# Patient Record
Sex: Female | Born: 1955 | Race: White | Hispanic: No | Smoking: Former smoker
Health system: Southern US, Community
[De-identification: ages and names within clinical notes are randomized; demographics above are authoritative.]

## PROBLEM LIST (undated history)

## (undated) DIAGNOSIS — H409 Unspecified glaucoma: Secondary | ICD-10-CM

## (undated) DIAGNOSIS — G43909 Migraine, unspecified, not intractable, without status migrainosus: Secondary | ICD-10-CM

## (undated) DIAGNOSIS — H269 Unspecified cataract: Secondary | ICD-10-CM

## (undated) DIAGNOSIS — E785 Hyperlipidemia, unspecified: Secondary | ICD-10-CM

## (undated) DIAGNOSIS — R7303 Prediabetes: Secondary | ICD-10-CM

## (undated) DIAGNOSIS — M1990 Unspecified osteoarthritis, unspecified site: Secondary | ICD-10-CM

## (undated) DIAGNOSIS — E669 Obesity, unspecified: Secondary | ICD-10-CM

## (undated) DIAGNOSIS — E039 Hypothyroidism, unspecified: Secondary | ICD-10-CM

## (undated) DIAGNOSIS — I10 Essential (primary) hypertension: Secondary | ICD-10-CM

## (undated) DIAGNOSIS — T7840XA Allergy, unspecified, initial encounter: Secondary | ICD-10-CM

## (undated) HISTORY — DX: Hypothyroidism, unspecified: E03.9

## (undated) HISTORY — PX: EYE SURGERY: SHX253

## (undated) HISTORY — DX: Hyperlipidemia, unspecified: E78.5

## (undated) HISTORY — DX: Unspecified glaucoma: H40.9

## (undated) HISTORY — DX: Unspecified cataract: H26.9

## (undated) HISTORY — DX: Migraine, unspecified, not intractable, without status migrainosus: G43.909

## (undated) HISTORY — PX: JOINT REPLACEMENT: SHX530

## (undated) HISTORY — DX: Obesity, unspecified: E66.9

## (undated) HISTORY — DX: Essential (primary) hypertension: I10

## (undated) HISTORY — DX: Unspecified osteoarthritis, unspecified site: M19.90

## (undated) HISTORY — DX: Allergy, unspecified, initial encounter: T78.40XA

---

## 1998-05-20 HISTORY — PX: OTHER SURGICAL HISTORY: SHX169

## 2001-05-20 HISTORY — PX: OTHER SURGICAL HISTORY: SHX169

## 2003-05-21 HISTORY — PX: ABDOMINAL HYSTERECTOMY: SHX81

## 2003-05-21 LAB — CONVERTED CEMR LAB

## 2005-08-18 HISTORY — PX: OTHER SURGICAL HISTORY: SHX169

## 2006-03-25 ENCOUNTER — Ambulatory Visit: Payer: Self-pay | Admitting: Family Medicine

## 2006-04-14 ENCOUNTER — Ambulatory Visit: Payer: Self-pay | Admitting: Family Medicine

## 2006-08-12 ENCOUNTER — Ambulatory Visit: Payer: Self-pay | Admitting: Family Medicine

## 2006-08-20 ENCOUNTER — Encounter: Payer: Self-pay | Admitting: Family Medicine

## 2006-08-21 ENCOUNTER — Ambulatory Visit: Payer: Self-pay | Admitting: Family Medicine

## 2006-09-22 ENCOUNTER — Encounter: Payer: Self-pay | Admitting: Family Medicine

## 2007-07-02 ENCOUNTER — Encounter: Payer: Self-pay | Admitting: Family Medicine

## 2007-07-02 DIAGNOSIS — E669 Obesity, unspecified: Secondary | ICD-10-CM | POA: Insufficient documentation

## 2007-07-02 DIAGNOSIS — Z87898 Personal history of other specified conditions: Secondary | ICD-10-CM | POA: Insufficient documentation

## 2007-07-02 DIAGNOSIS — E785 Hyperlipidemia, unspecified: Secondary | ICD-10-CM

## 2007-07-02 DIAGNOSIS — E039 Hypothyroidism, unspecified: Secondary | ICD-10-CM

## 2007-07-03 ENCOUNTER — Ambulatory Visit: Payer: Self-pay | Admitting: Family Medicine

## 2007-07-06 ENCOUNTER — Ambulatory Visit: Payer: Self-pay | Admitting: Family Medicine

## 2007-07-06 LAB — CONVERTED CEMR LAB
AST: 25 units/L (ref 0–37)
Bilirubin, Direct: 0.1 mg/dL (ref 0.0–0.3)
Chloride: 106 meq/L (ref 96–112)
Cholesterol: 190 mg/dL (ref 0–200)
Creatinine, Ser: 0.7 mg/dL (ref 0.4–1.2)
Glucose, Bld: 100 mg/dL — ABNORMAL HIGH (ref 70–99)
HDL: 41.1 mg/dL (ref >39.0)
Potassium: 4.5 meq/L (ref 3.5–5.1)
Sodium: 141 meq/L (ref 135–145)
TSH: 1.37 microintl units/mL (ref 0.35–5.50)
Total Bilirubin: 0.7 mg/dL (ref 0.3–1.2)
Total Protein: 7.2 g/dL (ref 6.0–8.3)
Triglycerides: 143 mg/dL (ref 0–149)
VLDL: 29 mg/dL (ref 0–40)

## 2007-07-09 ENCOUNTER — Telehealth: Payer: Self-pay | Admitting: Family Medicine

## 2007-07-24 ENCOUNTER — Ambulatory Visit: Payer: Self-pay | Admitting: Family Medicine

## 2007-07-27 ENCOUNTER — Encounter (INDEPENDENT_AMBULATORY_CARE_PROVIDER_SITE_OTHER): Payer: Self-pay | Admitting: *Deleted

## 2007-09-07 ENCOUNTER — Encounter: Admission: RE | Admit: 2007-09-07 | Discharge: 2007-09-07 | Payer: Self-pay | Admitting: Family Medicine

## 2008-07-11 ENCOUNTER — Ambulatory Visit: Payer: Self-pay | Admitting: Family Medicine

## 2008-07-20 LAB — CONVERTED CEMR LAB
ALT: 33 units/L (ref 0–35)
AST: 25 units/L (ref 0–37)
Alkaline Phosphatase: 69 units/L (ref 39–117)
Bilirubin, Direct: 0.1 mg/dL (ref 0.0–0.3)
CO2: 28 meq/L (ref 19–32)
Calcium: 9.1 mg/dL (ref 8.4–10.5)
Chloride: 103 meq/L (ref 96–112)
Glucose, Bld: 87 mg/dL (ref 70–99)
Potassium: 3.8 meq/L (ref 3.5–5.1)
Sodium: 139 meq/L (ref 135–145)
Total Bilirubin: 1 mg/dL (ref 0.3–1.2)
Total CHOL/HDL Ratio: 4.3

## 2008-08-05 ENCOUNTER — Ambulatory Visit: Payer: Self-pay | Admitting: Family Medicine

## 2008-08-05 ENCOUNTER — Encounter (INDEPENDENT_AMBULATORY_CARE_PROVIDER_SITE_OTHER): Payer: Self-pay | Admitting: *Deleted

## 2008-08-05 LAB — CONVERTED CEMR LAB: OCCULT 1: NEGATIVE

## 2008-09-07 ENCOUNTER — Encounter: Admission: RE | Admit: 2008-09-07 | Discharge: 2008-09-07 | Payer: Self-pay | Admitting: Family Medicine

## 2008-09-08 ENCOUNTER — Encounter (INDEPENDENT_AMBULATORY_CARE_PROVIDER_SITE_OTHER): Payer: Self-pay | Admitting: *Deleted

## 2009-06-21 ENCOUNTER — Telehealth: Payer: Self-pay | Admitting: Family Medicine

## 2009-08-10 ENCOUNTER — Ambulatory Visit: Payer: Self-pay | Admitting: Family Medicine

## 2009-08-11 LAB — CONVERTED CEMR LAB
ALT: 28 units/L (ref 0–35)
AST: 22 units/L (ref 0–37)
Albumin: 4.2 g/dL (ref 3.5–5.2)
Chloride: 104 meq/L (ref 96–112)
Eosinophils Relative: 2.2 % (ref 0.0–5.0)
GFR calc non Af Amer: 110.85 mL/min (ref >60)
Glucose, Bld: 99 mg/dL (ref 70–99)
HCT: 40.4 % (ref 36.0–46.0)
Hemoglobin: 13.3 g/dL (ref 12.0–15.0)
Lymphs Abs: 1.2 10*3/uL (ref 0.7–4.0)
MCV: 89.7 fL (ref 78.0–100.0)
Monocytes Relative: 9.6 % (ref 3.0–12.0)
Neutro Abs: 3.8 10*3/uL (ref 1.4–7.7)
Potassium: 4.2 meq/L (ref 3.5–5.1)
RDW: 13 % (ref 11.5–14.6)
Sodium: 141 meq/L (ref 135–145)
TSH: 1.51 microintl units/mL (ref 0.35–5.50)
WBC: 5.6 10*3/uL (ref 4.5–10.5)

## 2009-08-15 ENCOUNTER — Ambulatory Visit: Payer: Self-pay | Admitting: Family Medicine

## 2009-08-15 LAB — HM COLONOSCOPY

## 2009-08-28 ENCOUNTER — Ambulatory Visit: Payer: Self-pay | Admitting: Family Medicine

## 2009-08-29 LAB — CONVERTED CEMR LAB: Fecal Occult Bld: NEGATIVE

## 2009-09-18 ENCOUNTER — Encounter: Admission: RE | Admit: 2009-09-18 | Discharge: 2009-09-18 | Payer: Self-pay | Admitting: Family Medicine

## 2009-09-18 LAB — HM MAMMOGRAPHY: HM Mammogram: NEGATIVE

## 2009-10-25 ENCOUNTER — Encounter: Admission: RE | Admit: 2009-10-25 | Discharge: 2009-10-25 | Payer: Self-pay | Admitting: Sports Medicine

## 2010-04-04 ENCOUNTER — Ambulatory Visit: Payer: Self-pay | Admitting: Family Medicine

## 2010-04-04 ENCOUNTER — Encounter: Admission: RE | Admit: 2010-04-04 | Discharge: 2010-04-04 | Payer: Self-pay | Admitting: Family Medicine

## 2010-04-04 DIAGNOSIS — Z87891 Personal history of nicotine dependence: Secondary | ICD-10-CM | POA: Insufficient documentation

## 2010-04-04 DIAGNOSIS — I10 Essential (primary) hypertension: Secondary | ICD-10-CM | POA: Insufficient documentation

## 2010-04-04 DIAGNOSIS — M1610 Unilateral primary osteoarthritis, unspecified hip: Secondary | ICD-10-CM | POA: Insufficient documentation

## 2010-04-04 DIAGNOSIS — M169 Osteoarthritis of hip, unspecified: Secondary | ICD-10-CM

## 2010-04-04 HISTORY — DX: Essential (primary) hypertension: I10

## 2010-05-01 ENCOUNTER — Inpatient Hospital Stay (HOSPITAL_COMMUNITY)
Admission: RE | Admit: 2010-05-01 | Discharge: 2010-05-03 | Payer: Self-pay | Source: Home / Self Care | Attending: Orthopedic Surgery | Admitting: Orthopedic Surgery

## 2010-05-08 ENCOUNTER — Encounter: Payer: Self-pay | Admitting: Family Medicine

## 2010-05-23 ENCOUNTER — Ambulatory Visit
Admission: RE | Admit: 2010-05-23 | Discharge: 2010-05-23 | Payer: Self-pay | Source: Home / Self Care | Attending: Family Medicine | Admitting: Family Medicine

## 2010-05-23 ENCOUNTER — Encounter: Payer: Self-pay | Admitting: Family Medicine

## 2010-05-23 DIAGNOSIS — F4321 Adjustment disorder with depressed mood: Secondary | ICD-10-CM | POA: Insufficient documentation

## 2010-05-23 DIAGNOSIS — R7301 Impaired fasting glucose: Secondary | ICD-10-CM | POA: Insufficient documentation

## 2010-05-24 LAB — CONVERTED CEMR LAB: TSH: 0.877 microintl units/mL (ref 0.350–4.500)

## 2010-06-19 NOTE — Progress Notes (Signed)
Summary: LEVOTHYROXINE SODIUM 125 MCG TABS  Phone Note Call from Patient Call back at [phone]   Caller: Patient Call For: Kerby Nora MD Summary of Call: She scheduled her yearly exam for March 29th. She says that her rx LEVOTHYROXINE SODIUM 125 MCG TABS will not last until then. She wants to know if she can get a 30 day supply [facility] [place].  Initial call taken by: Melody Comas,  June 21, 2009 9:27 AM    Prescriptions: LEVOTHYROXINE SODIUM 125 MCG TABS (LEVOTHYROXINE SODIUM) Take 1 tablet by mouth once a day  #30 x 0   Entered by:   Lewanda Rife LPN   Authorized by:   Kerby Nora MD   Signed by:   Lewanda Rife LPN on 45/40/9811   Method used:   [facility]        [facility]  [place] (retail)       [address]       [place], [place]  [address]       Ph: [phone] or [phone]       Fax: [phone]   RxID:   [id number]

## 2010-06-19 NOTE — Assessment & Plan Note (Signed)
Summary: CPX  CYD   Vital Signs:  Patient profile:   55 year old female Height:      68 inches Weight:      229.2 pounds BMI:     34.98 Temp:     98.3 degrees F oral Pulse rate:   80 / minute Pulse rhythm:   regular BP sitting:   138 / 78  (left arm) Cuff size:   large  Vitals Entered By: Benny Lennert CMA Duncan Dull) (August 15, 2009 1:56 PM)  History of Present Illness: Chief complaint cpx  The patient is here for annual wellness exam and preventative care.     Doing well overall. No acute issues.  Moving to [place] with her husband. Plans to establish with Dr. Seymour Bars.  Reviewed labs in detail.   Problems Prior to Update: 1)  Other Screening Mammogram  (ICD-V76.12) 2)  Special Screening Malig Neoplasms Other Sites  (ICD-V76.49) 3)  Onychomycosis, Toenails  (ICD-110.1) 4)  Healthy Adult Female  (ICD-V70.0) 5)  Obesity  (ICD-278.00) 6)  Migraines, Hx of  (ICD-V13.8) 7)  Hypothyroidism  (ICD-244.9) 8)  Hyperlipidemia  (ICD-272.4)  Current Medications (verified): 1)  Levothyroxine Sodium 125 Mcg Tabs (Levothyroxine Sodium) .... Take 1 Tablet By Mouth Once A Day 2)  Multivitamins   Tabs (Multiple Vitamin) .... Take 1 Tablet By Mouth Once A Day 3)  Vitamin D .... Take 1 Tablet By Mouth Once A Day 4)  Caltrate 600+d 600-400 Mg-Unit  Tabs (Calcium Carbonate-Vitamin D) .... Take 1 Tablet By Mouth Once A Day 5)  Vitamin B-12 .... Take 1 Tablet By Mouth Once A Day 6)  Zinc .... Take 1 Tablet By Mouth Once A Day 7)  Celebrex 200 Mg Caps (Celecoxib) .... Take 1 Capsule By Mouth Once A Day   3 - 4 Times Per Week. 8)  Calcium 500 Mg Tabs (Calcium) .... One Tablet By Mouth Daily 9)  Fish Oil 1000 Mg Caps (Omega-3 Fatty Acids) .Marland Kitchen.. 1 Tab Two Times A Day  Allergies (verified): No Known Drug Allergies  Past History:  Past medical, surgical, family and social histories (including risk factors) reviewed, and no changes noted (except as noted below).  Past Medical  History: Reviewed history from 07/02/2007 and no changes required. OBESITY (ICD-278.00) MIGRAINES, HX OF (ICD-V13.8) HYPOTHYROIDISM (ICD-244.9) HYPERLIPIDEMIA (ICD-272.4)    Past Surgical History: Reviewed history from 07/02/2007 and no changes required. 2003    Migraines, resolved 2000    Right foot surgery, callus removed 08/2005 DEXA (+) 1.7  Family History: Reviewed history from 07/02/2007 and no changes required. Father: Alive 44, MI, stents > age 71, HTN Mother: Alive 88, MI, stents, high cholesterol Siblings: 1 brother, high cholesterol, 1 sister healthy  Social History: Reviewed history from 07/02/2007 and no changes required. Former Smoker, 15-20 PYH Alcohol use-yes, Light, 2-3 glasses of wine on weekends Drug use-no Regular exercise-no, because of right knee Marital Status: Married x 13 years, no domestic abuse Children: None Occupation: Administrator Diet :  3 meals, veggies and fruits, + H2O, [employer] biscuit one time a week  Review of Systems       no breast lesions, no vaginal discharges  General:  Denies fatigue. CV:  Denies chest pain or discomfort. Resp:  Denies shortness of breath. GI:  Denies abdominal pain. GU:  Denies dysuria. MS:  left hip pain..moderate control with voltaren. Knee pain moderate control. . Derm:  Denies rash. Psych:  Denies anxiety and depression. Endo:  Denies  cold intolerance, excessive urination, heat intolerance, and weight change.  Physical Exam  General:  obese appearing female  in NAD Ears:  External ear exam shows no significant lesions or deformities.  Otoscopic examination reveals clear canals, tympanic membranes are intact bilaterally without bulging, retraction, inflammation or discharge. Hearing is grossly normal bilaterally. Nose:  External nasal examination shows no deformity or inflammation. Nasal mucosa are pink and moist without lesions or exudates. Mouth:  Oral mucosa and oropharynx without  lesions or exudates.  Teeth in good repair. Neck:  no carotid bruit or thyromegaly no cervical or supraclavicular lymphadenopathy  Lungs:  Normal respiratory effort, chest expands symmetrically. Lungs are clear to auscultation, no crackles or wheezes. Heart:  Normal rate and regular rhythm. S1 and S2 normal without gallop, murmur, click, rub or other extra sounds. Abdomen:  Bowel sounds positive,abdomen soft and non-tender without masses, organomegaly or hernias noted. Msk:  No deformity or scoliosis noted of thoracic or lumbar spine.   Decrease ROM internal rotation left hip Pulses:  R and L posterior tibial pulses are full and equal bilaterally  Extremities:  No clubbing, cyanosis, edema, or deformity noted with normal full range of motion of all joints.   Neurologic:  No cranial nerve deficits noted. Station and gait are normal. DTRs are symmetrical throughout. Sensory, motor and coordinative functions appear intact. Skin:  Intact without suspicious lesions or rashes Psych:  Cognition and judgment appear intact. Alert and cooperative with normal attention span and concentration. No apparent delusions, illusions, hallucinations   Impression & Recommendations:  Problem # 1:  Preventive Health Care (ICD-V70.0) The patient's preventative maintenance and recommended screening tests for an annual wellness exam were reviewed in full today. Brought up to date unless services declined.  Counselled on the importance of diet, exercise, and its role in overall health and mortality. The patient's FH and SH was reviewed, including their home life, tobacco status, and drug and alcohol status.     Problem # 2:  HYPOTHYROIDISM (ICD-244.9)  Well controlled. Continue current medication.  Her updated medication list for this problem includes:    Levothyroxine Sodium 125 Mcg Tabs (Levothyroxine sodium) .Marland Kitchen... Take 1 tablet by mouth once a day  Labs Reviewed: TSH: 1.51 (08/10/2009)    Chol: 199  (08/10/2009)   HDL: 51.90 (08/10/2009)   LDL: 131 (08/10/2009)   TG: 82.0 (08/10/2009)  Problem # 3:  HYPERLIPIDEMIA (ICD-272.4) Well controlled with diet and exercise..continue working on weight loss.   Complete Medication List: 1)  Levothyroxine Sodium 125 Mcg Tabs (Levothyroxine sodium) .... Take 1 tablet by mouth once a day 2)  Multivitamins Tabs (Multiple vitamin) .... Take 1 tablet by mouth once a day 3)  Vitamin D  .... Take 1 tablet by mouth once a day 4)  Caltrate 600+d 600-400 Mg-unit Tabs (Calcium carbonate-vitamin d) .... Take 1 tablet by mouth once a day 5)  Vitamin B-12  .... Take 1 tablet by mouth once a day 6)  Zinc  .... Take 1 tablet by mouth once a day 7)  Celebrex 200 Mg Caps (Celecoxib) .... Take 1 capsule by mouth once a day   3 - 4 times per week. 8)  Calcium 500 Mg Tabs (Calcium) .... One tablet by mouth daily 9)  Fish Oil 1000 Mg Caps (Omega-3 fatty acids) .Marland Kitchen.. 1 tab two times a day  Patient Instructions: 1)  Fish oil 2000 mg divided daily: DHA and EPA. 2)  Complete stool cards.  3)  Continue healthy diet, exercise  and weight loss. 4)  Please schedule a follow-up appointment in 1 year.  Prescriptions: LEVOTHYROXINE SODIUM 125 MCG TABS (LEVOTHYROXINE SODIUM) Take 1 tablet by mouth once a day  #90 x 3   Entered and Authorized by:   Kerby Nora MD   Signed by:   Kerby Nora MD on 08/15/2009   Method used:   Electronically to        [facility] [address] (mail-order)       [facility]       [address]       [place], [place]  [address]       Ph: [phone]       Fax: [phone]   RxID:   [id number]   Current Allergies (reviewed today): No known allergies   Family History:    Reviewed history from 07/02/2007 and no changes required:       Father: Alive 43, MI, stents > age 50, HTN       Mother: Alive 9, MI, stents, high cholesterol       Siblings: 1 brother, high cholesterol, 1 sister healthy         Social History:     Reviewed history from 07/02/2007 and no changes required:       Former Smoker, 15-20 PYH       Alcohol use-yes, Light, 2-3 glasses of wine on weekends       Drug use-no       Regular exercise-no, because of right knee       Marital Status: Married x 13 years, no domestic abuse       Children: None       Occupation: Research scientist (medical) - [employer]       Diet :  3 meals, veggies and fruits, + H2O, [employer] biscuit one time a week          Last Flu Vaccine:  given (04/26/2008 11:46:56 AM) Flu Vaccine Result Date:  02/17/2009 Flu Vaccine Result:  given Flu Vaccine Next Due:  1 yr Colonoscopy Next Due:  Refused

## 2010-06-19 NOTE — Assessment & Plan Note (Signed)
Summary: pre- op clearance   Vital Signs:  Patient profile:   55 year old female Height:      68 inches Weight:      232 pounds BMI:     35.40 O2 Sat:      96 % on Room air Pulse rate:   82 / minute BP sitting:   151 / 90  (left arm) Cuff size:   large  Vitals Entered By: Payton Spark CMA (April 04, 2010 8:22 AM)  O2 Flow:  Room air CC: New to est. Surgery clearance   Primary Care Provider:  Seymour Bars DO  CC:  New to est. Surgery clearance.  History of Present Illness: 55 yo WF presents to est care.    Formerly seen by Dr Ermalene Searing at [facility].  She is a previously healthy obese nulligravid  postmenopuasal female with hx of hypothyroidism and OA.  She is set up to have a L total hip with Dr Ferd Hibbs 12-13.  She is due for an EKG today.  She had labs done at work on 8-10-201 and her A1C was 6.0= IFG range.  Her total cholesterol was 229, HDL 58, TGs 97, LDL 152.  She has never been diagnosed with HTN.  Has been exercising less with L hip pain.  No hx of OSA, asthma or heart diseases.  Quit smoking 2002.  Mammogram updated 5-201.      Current Medications (verified): 1)  Levothyroxine Sodium 125 Mcg Tabs (Levothyroxine Sodium) .... Take 1 Tablet By Mouth Once A Day 2)  Celebrex 200 Mg Caps (Celecoxib) .... Take 1 Capsule By Mouth Once A Day   3 - 4 Times Per Week. 3)  Hydrocodone-Acetaminophen 5-500 Mg Tabs (Hydrocodone-Acetaminophen) .... Take 1-2 Tabs By Mouth Daily As Needed  Allergies (verified): No Known Drug Allergies  Past History:  Past Medical History: OBESITY (ICD-278.00) MIGRAINES, HX OF (ICD-V13.8) HYPOTHYROIDISM (ICD-244.9) HYPERLIPIDEMIA (ICD-272.4) IFG     Past Surgical History: Reviewed history from 07/02/2007 and no changes required. 2003    Migraines, resolved 2000    Right foot surgery, callus removed 08/2005 DEXA (+) 1.7  Family History: Reviewed history from 07/02/2007 and no changes required. Father: Alive 55, MI, stents > age  22, HTN Mother: Alive 28, MI, stents, high cholesterol Siblings: 1 brother, high cholesterol, 1 sister healthy  Social History: Reviewed history from 07/02/2007 and no changes required. Former Smoker, 15-20 PYH Alcohol use-yes, Light, 2-3 glasses of wine on weekends Drug use-no Regular exercise-no, because of right knee Marital Status: Married x 13 years, no domestic abuse Children: None Occupation: Administrator Diet :  3 meals, veggies and fruits, + H2O, [employer] biscuit one time a week  Review of Systems       no fevers/sweats/weakness, unexplained wt loss/gain, no change in vision, no difficulty hearing, ringing in ears, no hay fever/allergies, no CP/discomfort, no palpitations, no breast lump/nipple discharge, no cough/wheeze, no blood in stool, no N/V/D, no nocturia, no leaking urine, no unusual vag bleeding, no vaginal/penile discharge, + muscle/joint pain, no rash, no new/changing mole, no HA, no memory loss, no anxiety, no sleep problem, no depression, no unexplained lumps, no easy bruising/bleeding, no concern with sexual function   Physical Exam  General:  alert, well-developed, well-nourished, well-hydrated, and overweight-appearing.   Head:  normocephalic and atraumatic.   Ears:  no external deformities.   Nose:  no nasal discharge.   Mouth:  good dentition and pharynx pink and moist.   Neck:  no masses.   Lungs:  Normal respiratory effort, chest expands symmetrically. Lungs are clear to auscultation, no crackles or wheezes. Heart:  Normal rate and regular rhythm. S1 and S2 normal without gallop, murmur, click, rub or other extra sounds. Abdomen:  Bowel sounds positive,abdomen soft and non-tender without masses, organomegaly; no AA bruits Msk:  antalgic gait with limp Pulses:  2+ radial and pedal pulses Extremities:  no UE or LE edema Neurologic:  alert & oriented X3 and cranial nerves II-XII intact.   Skin:  color normal.   Cervical Nodes:  No  lymphadenopathy noted Psych:  good eye contact, not anxious appearing, and not depressed appearing.     Impression & Recommendations:  Problem # 1:  OSTEOARTHRITIS, HIP, LEFT (ICD-715.95) Pre-op physical done  for L hip replacement to be done next month with Dr Ferd Hibbs with review of Aug 2011 labs showing hyperlipidemia and IFG.  BP high today w/o previous dx.  BMI high at 35 c/w class II obesity.  EKG done for pre- op clearance shows NSR at HR 71, QTc of 404 ms, no arryhtmia or sign of ischemia.  CXR done for hx of smoking, quit 2002.  Labs to be done pre-op by surgeon. Did not need RFs. Enouraged healthy diet and wt loss.   Her updated medication list for this problem includes:    Celebrex 200 Mg Caps (Celecoxib) .Marland Kitchen... Take 1 capsule by mouth once a day   3 - 4 times per week.    Hydrocodone-acetaminophen 5-500 Mg Tabs (Hydrocodone-acetaminophen) .Marland Kitchen... Take 1-2 tabs by mouth daily as needed  Complete Medication List: 1)  Levothyroxine Sodium 125 Mcg Tabs (Levothyroxine sodium) .... Take 1 tablet by mouth once a day 2)  Celebrex 200 Mg Caps (Celecoxib) .... Take 1 capsule by mouth once a day   3 - 4 times per week. 3)  Hydrocodone-acetaminophen 5-500 Mg Tabs (Hydrocodone-acetaminophen) .... Take 1-2 tabs by mouth daily as needed  Other Orders: EKG w/ Interpretation (93000) T-DG Chest 2 View (03474)  Patient Instructions: 1)  EKG OK 2)  CXR downstairs. 3)  Work on healthy, low carb, low sugar diet with regular exercise (as tolerated with hip DJD). 4)  REturn for follow up BP/ sugar in 6 wks.   Orders Added: 1)  Consultation Level III [99243] 2)  EKG w/ Interpretation [93000] 3)  T-DG Chest 2 View [71020]

## 2010-06-21 NOTE — Medication Information (Signed)
Summary: Med Issue Order/[facility]  Med Issue Order/[facility]   Imported By: Lanelle Bal 05/18/2010 12:27:38  _____________________________________________________________________  External Attachment:    Type:   Image     Comment:   External Document

## 2010-06-21 NOTE — Assessment & Plan Note (Signed)
Summary: f/u IFG   Vital Signs:  Patient profile:   55 year old female Height:      68 inches Weight:      221 pounds BMI:     33.72 Pulse rate:   78 / minute BP sitting:   132 / 75  (left arm) Cuff size:   large  Vitals Entered By: Kathlene November LPN (May 23, 2010 7:59 AM) CC: recheck BP   Primary Care Provider:  Seymour Bars DO  CC:  recheck BP.  History of Present Illness: 55 yo WF presents for f/u visit.  She had elevated BP at last visit and a hx of IFG.  Due to recheck a fasting sugar and her BP today.     She had a hip replacement with Dr Charlann Boxer in Nov and is healing well.  She if finishing up with PT and has been more active.  She has already lost 11 lbs and feels great.  Tapering down on pain meds.  BP has much improved.  Labs done in March.  She is tearful, telling me that her husband has filed for divorced and is moving out [place].  She has good family support and loves her job here.  She has signed up for counseling already.    Current Medications (verified): 1)  Levothyroxine Sodium 125 Mcg Tabs (Levothyroxine Sodium) .... Take 1 Tablet By Mouth Once A Day 2)  Hydrocodone-Acetaminophen 5-500 Mg Tabs (Hydrocodone-Acetaminophen) .... Take 1-2 Tabs By Mouth Daily As Needed  Allergies (verified): No Known Drug Allergies  Comments:  Nurse/Medical Assistant: The patient's medications and allergies were reviewed with the patient and were updated in the Medication and Allergy Lists. Kathlene November LPN (May 23, 2010 8:00 AM)  Past History:  Past Medical History: Reviewed history from 04/04/2010 and no changes required. OBESITY (ICD-278.00) MIGRAINES, HX OF (ICD-V13.8) HYPOTHYROIDISM (ICD-244.9) HYPERLIPIDEMIA (ICD-272.4) IFG     Past Surgical History: 2003    Migraines, resolved 2000    Right foot surgery, callus removed 08/2005 DEXA (+) 1.7 L hip replacement, Dr Charlann Boxer 03-2010  Social History: Reviewed history from 07/02/2007 and no changes  required. Former Smoker, 15-20 PYH Alcohol use-yes, Light, 2-3 glasses of wine on weekends Drug use-no Regular exercise-no, because of right knee Marital Status: Married x 13 years, no domestic abuse Children: None Occupation: Administrator Diet :  3 meals, veggies and fruits, + H2O, [employer] biscuit one time a week  Review of Systems      See HPI  Physical Exam  General:  alert, well-developed, well-nourished, and well-hydrated.  obese Head:  normocephalic and atraumatic.   Mouth:  pharynx pink and moist.   Neck:  no masses.   Lungs:  Normal respiratory effort, chest expands symmetrically. Lungs are clear to auscultation, no crackles or wheezes. Heart:  Normal rate and regular rhythm. S1 and S2 normal without gallop, murmur, click, rub or other extra sounds. Msk:  limited L hip flexion with mildly antalgic gait Extremities:  no LE edema Skin:  color normal.   Cervical Nodes:  No lymphadenopathy noted Psych:  good eye contact, not anxious appearing, and tearful.     Impression & Recommendations:  Problem # 1:  OSTEOARTHRITIS, HIP, LEFT (ICD-715.95) Much improved after ORIF with Dr Charlann Boxer in Nov.  She is tapering down off pain meds and can alternate OTC Tylenol with Aleve as needed once she goes back to work next wk. The following medications were removed from the medication list:  Celebrex 200 Mg Caps (Celecoxib) .Marland Kitchen... Take 1 capsule by mouth once a day   3 - 4 times per week. Her updated medication list for this problem includes:    Hydrocodone-acetaminophen 5-500 Mg Tabs (Hydrocodone-acetaminophen) .Marland Kitchen... Take 1-2 tabs by mouth daily as needed  Problem # 2:  IMPAIRED FASTING GLUCOSE (ICD-790.21) Fasting sugar 109 today c/w IFG.   BMI 33 c/w class I obesity. 11 lbs lost already since hip replacement due to increased activity.  We discussed a low sugar/ low carb diet with regular exercise.  Repeat in 6 mos.  Problem # 3:  ELEVATED BLOOD PRESSURE WITHOUT  DIAGNOSIS OF HYPERTENSION (ICD-796.2) Assessment: Improved BP at goal today, not on meds. Continue to work on wt loss.  Problem # 4:  ADJUSTMENT DISORDER WITH DEPRESSED MOOD (ICD-309.0) She is understandilby upset and has signed up for counseling already.  She has a good support system and is excited to return to work which she enjoys.  She declined need for meds and will let me know if needing additional help.  Complete Medication List: 1)  Levothyroxine Sodium 125 Mcg Tabs (Levothyroxine sodium) .... Take 1 tablet by mouth once a day 2)  Hydrocodone-acetaminophen 5-500 Mg Tabs (Hydrocodone-acetaminophen) .... Take 1-2 tabs by mouth daily as needed  Other Orders: T-TSH ([id number])  Patient Instructions: 1)  TSH today. 2)  Will call you w/ results tomorrow. 3)  BP looks great. 4)  Sugar 109 = pre-diabetes. 5)  Work on low sugar/ low carb diet with regular exercise and continue the good work with weight loss! 6)  Return in  4 months for a PHYSICAL with FAST[facility] LABS.   Orders Added: 1)  T-TSH [[id number]] 2)  Est. Patient Level IV [84132]

## 2010-07-30 LAB — CBC
HCT: 34.7 % — ABNORMAL LOW (ref 36.0–46.0)
Hemoglobin: 11.3 g/dL — ABNORMAL LOW (ref 12.0–15.0)
RDW: 14 % (ref 11.5–15.5)
WBC: 10.8 10*3/uL — ABNORMAL HIGH (ref 4.0–10.5)

## 2010-07-30 LAB — BASIC METABOLIC PANEL
GFR calc Af Amer: >60 mL/min (ref >60)
GFR calc non Af Amer: >60 mL/min (ref >60)
Potassium: 3.7 mEq/L (ref 3.5–5.1)
Sodium: 137 mEq/L (ref 135–145)

## 2010-07-31 ENCOUNTER — Encounter: Payer: Self-pay | Admitting: Family Medicine

## 2010-07-31 LAB — BASIC METABOLIC PANEL
BUN: 11 mg/dL (ref 6–23)
CO2: 27 mEq/L (ref 19–32)
Chloride: 105 mEq/L (ref 96–112)
Creatinine, Ser: 0.53 mg/dL (ref 0.4–1.2)
Creatinine, Ser: 0.63 mg/dL (ref 0.4–1.2)
GFR calc Af Amer: >60 mL/min (ref >60)
GFR calc non Af Amer: >60 mL/min (ref >60)
Potassium: 4 mEq/L (ref 3.5–5.1)
Potassium: 4.1 mEq/L (ref 3.5–5.1)

## 2010-07-31 LAB — CBC
HCT: 33.9 % — ABNORMAL LOW (ref 36.0–46.0)
HCT: 42.8 % (ref 36.0–46.0)
Hemoglobin: 10.9 g/dL — ABNORMAL LOW (ref 12.0–15.0)
MCH: 29.1 pg (ref 26.0–34.0)
MCV: 90.6 fL (ref 78.0–100.0)
Platelets: 289 10*3/uL (ref 150–400)
Platelets: 348 10*3/uL (ref 150–400)
RBC: 3.74 MIL/uL — ABNORMAL LOW (ref 3.87–5.11)
RDW: 13.7 % (ref 11.5–15.5)
WBC: 6.3 10*3/uL (ref 4.0–10.5)
WBC: 9.3 10*3/uL (ref 4.0–10.5)

## 2010-07-31 LAB — SURGICAL PCR SCREEN: MRSA, PCR: NEGATIVE

## 2010-07-31 LAB — DIFFERENTIAL
Basophils Absolute: 0 10*3/uL (ref 0.0–0.1)
Lymphocytes Relative: 23 % (ref 12–46)
Neutro Abs: 4.2 10*3/uL (ref 1.7–7.7)
Neutrophils Relative %: 67 % (ref 43–77)

## 2010-07-31 LAB — URINALYSIS, ROUTINE W REFLEX MICROSCOPIC
Ketones, ur: NEGATIVE mg/dL
Nitrite: NEGATIVE
Specific Gravity, Urine: 1.007 (ref 1.005–1.030)
pH: 7.5 (ref 5.0–8.0)

## 2010-07-31 LAB — TYPE AND SCREEN: ABO/RH(D): O POS

## 2010-07-31 LAB — PROTIME-INR
INR: 1.03 (ref 0.00–1.49)
Prothrombin Time: 13.7 seconds (ref 11.6–15.2)

## 2010-07-31 LAB — APTT: aPTT: 39 seconds — ABNORMAL HIGH (ref 24–37)

## 2010-08-30 ENCOUNTER — Other Ambulatory Visit: Payer: Self-pay | Admitting: Family Medicine

## 2010-08-30 DIAGNOSIS — Z1231 Encounter for screening mammogram for malignant neoplasm of breast: Secondary | ICD-10-CM

## 2010-09-07 ENCOUNTER — Encounter: Payer: Self-pay | Admitting: Family Medicine

## 2010-09-25 ENCOUNTER — Ambulatory Visit
Admission: RE | Admit: 2010-09-25 | Discharge: 2010-09-25 | Disposition: A | Discharge status: Home / Self Care | Payer: BC Managed Care – PPO | Source: Ambulatory Visit | Attending: Family Medicine | Admitting: Family Medicine

## 2010-09-25 ENCOUNTER — Other Ambulatory Visit: Payer: BC Managed Care – PPO

## 2010-09-25 DIAGNOSIS — Z13220 Encounter for screening for lipoid disorders: Secondary | ICD-10-CM

## 2010-09-25 DIAGNOSIS — E039 Hypothyroidism, unspecified: Secondary | ICD-10-CM

## 2010-09-25 DIAGNOSIS — Z1231 Encounter for screening mammogram for malignant neoplasm of breast: Secondary | ICD-10-CM

## 2010-09-25 DIAGNOSIS — Z130 Encounter for screening for diseases of the blood and blood-forming organs and certain disorders involving the immune mechanism: Secondary | ICD-10-CM

## 2010-09-25 LAB — LIPID PANEL
LDL Cholesterol: 141 mg/dL — ABNORMAL HIGH (ref 0–99)
Triglycerides: 111 mg/dL (ref <150)
VLDL: 22 mg/dL (ref 0–40)

## 2010-09-25 LAB — TSH: TSH: 0.956 u[IU]/mL (ref 0.350–4.500)

## 2010-09-26 ENCOUNTER — Telehealth: Payer: Self-pay | Admitting: Family Medicine

## 2010-09-26 LAB — COMPLETE METABOLIC PANEL WITH GFR
ALT: 18 U/L (ref 0–35)
AST: 18 U/L (ref 0–37)
Albumin: 4.6 g/dL (ref 3.5–5.2)
CO2: 24 mEq/L (ref 19–32)
Calcium: 9.7 mg/dL (ref 8.4–10.5)
Chloride: 102 mEq/L (ref 96–112)
GFR, Est African American: >60 mL/min (ref >60)
Potassium: 4.7 mEq/L (ref 3.5–5.3)
Sodium: 138 mEq/L (ref 135–145)
Total Protein: 7.1 g/dL (ref 6.0–8.3)

## 2010-09-26 NOTE — Telephone Encounter (Signed)
LMOM informing Pt of the above. Pt to CB to let me know if she will restart cholesterol med

## 2010-09-26 NOTE — Telephone Encounter (Signed)
Pls let pt know that her thyroid looks perfect on current dose of thyroid medication.  Continue and will RF if needed. Fasting sugar, liver and kidney function are normal.  Cholesterol is high at 217 and LDL bad chol is high at 141.  She is off cholesterol meds.  Is she willing to restart to get to a goal LDL of 100?

## 2010-09-28 ENCOUNTER — Encounter: Payer: Self-pay | Admitting: Family Medicine

## 2010-09-28 ENCOUNTER — Ambulatory Visit (INDEPENDENT_AMBULATORY_CARE_PROVIDER_SITE_OTHER): Payer: BC Managed Care – PPO | Admitting: Family Medicine

## 2010-09-28 VITALS — BP 146/90 | HR 86 | Ht 69.0 in | Wt 214.0 lb

## 2010-09-28 DIAGNOSIS — L989 Disorder of the skin and subcutaneous tissue, unspecified: Secondary | ICD-10-CM

## 2010-09-28 DIAGNOSIS — Z0000 Encounter for general adult medical examination without abnormal findings: Secondary | ICD-10-CM

## 2010-09-28 MED ORDER — LEVOTHYROXINE SODIUM 125 MCG PO TABS: 125.0000 ug | ORAL_TABLET | Freq: Every day | ORAL | Status: DC

## 2010-09-28 NOTE — Patient Instructions (Signed)
Keep up the good work with diet, exercise, wt loss.  Wt down from 221--> 214.  Repeat BP:  Thyroid RX sent.  Return for f/u cholesterol in 6 mos.

## 2010-09-28 NOTE — Progress Notes (Signed)
Subjective:    Patient ID: [name] [name], female    DOB: September 01, 1955, 55 y.o.   MRN: [id number]  HPI 55 yo WF presents for CPE w/o pap.  She had a hysterectomy in 05 for DUB with oophorectomy.  Due for a RF of thyroid medication. Sure he had labs done last wk and her fasting sugar had much improved.  Her LDL rose to 141 but she does not have any cardiac risk factors.  She is really working on diet, exercise and wt loss, down 7 lbs thus far.  Denies chest pain or DOE.  Denies fam hx of premature heart dz, colon cancer or breast cancer.  She just had a normal mammogram last wk.  She reports having a normal colonoscopy with Dr Juanda Chance about 2 yrs ago.  Her tdap was updated in 09.  She is happier since going thru divorce.  BP 146/90  Pulse 86  Ht 5\' 9"  (1.753 m)  Wt 214 lb (97.07 kg)  BMI 31.60 kg/m2  SpO2 98%  Past Medical History  Diagnosis Date  . Obesity   . Migraine     hx of  . Hyperlipidemia   . Hypothyroidism   . Obesity   . Migraines     Past Surgical History  Procedure Date  . Migraines, resolved 2003  . Right foot surgery 2000    callus removed  . Dexa 4/07    (+) 1.7  . Left hip replacement 11-11    Dr Charlann Boxer  . Joint replacement     L hip replacement 2011    Family History  Problem Relation Age of Onset  . Heart attack Mother   . Other Mother     stents  . Hyperlipidemia Mother   . Other Father 55    stents  . Hypertension Father   . Hyperlipidemia Brother     History   Social History  . Marital Status: Married    Spouse Name: divorced    Number of Children: N/A  . Years of Education: N/A   Occupational History  .     Social History Main Topics  . Smoking status: Former Games developer  . Smokeless tobacco: Not on file  . Alcohol Use: 1.8 oz/week    3 Glasses of wine per week     2-3 glasses of wine on weekends  . Drug Use: No  . Sexually Active: Not Currently   Other Topics Concern  . Not on file   Social History Narrative  . No narrative  on file    Not on File  Current outpatient prescriptions:HYDROcodone-acetaminophen (VICODIN) 5-500 MG per tablet, Take 1 tablet by mouth daily as needed. Take 1-2 tabs po daily prn , Disp: , Rfl: ;  levothyroxine (SYNTHROID, LEVOTHROID) 125 MCG tablet, Take 1 tablet (125 mcg total) by mouth daily., Disp: 90 tablet, Rfl: 3;  DISCONTD: levothyroxine (SYNTHROID, LEVOTHROID) 125 MCG tablet, Take 125 mcg by mouth daily.  , Disp: , Rfl:     Review of Systems Gen: no fevers, chills, hot flashes, night sweats, change in weight GI: no N/V/C/D GU: no dysuria, incontinence or sexual dysfunction CV: no chest pain, DOE, palpitations s or edema Pulm:  Denies CP, SOB or chronic cough     Objective:   Physical Exam    Gen: alert, well groomed in NAD Neck: no thyromegaly or cervical lymphadenopathy CV: RRR w/o murmur, no audible carotid bruits or abdominal aortic bruits Ext: no edema, clubbing or cyanosis Lungs: CTA bilat  w/o W/R/R; nonlabored HEENT:  [place]/AT; PERRLA; oropharynx pink and moist with good dentition Abd: soft, NT, ND, NABS, No HSM, no audible AA bruits Skin: warm and dry; no rash, pallor or jaundice, blushish colored papular lesion proximal R leg, slightly tender Psych: does not appear anxious or depressed; answers questions appropriately    Assessment & Plan:  Assesment:  1. CPE- Keeping healthy checklist for  women reviewed today.  BP high today.  BMI 31.6 c/w class I obesity range.     Labs done last wk, reviewed today with pt.  Will repeat LDL in 6 mos. Colonoscopy UTD, Dr Juanda Chance S/p TAH for noncancerous reasons, not sexually active - no need for paps. Mammogram UTd. Encouraged healthy diet, regular exercise, MVI daily. Return for next physical in 1 yr.   Derm referral made.

## 2010-10-05 NOTE — Assessment & Plan Note (Signed)
[facility]                             [facility] OFFICE NOTE   NAME:[name], [name]                        MRN:          [id number]  DATE:03/25/2006                            DOB:          February 13, 1956    CHIEF COMPLAINT:  A 55 year old white female here to establish a new doctor.   HISTORY OF PRESENT ILLNESS:  1. Right knee pain, acute.  She has been having pain in her right knee for      2 weeks.  She feels that her knee was irritated by the fact that she is      now living in a house with stairs and that she has been moving boxes in      and out and doing a lot more stair climbing than she used to.  She      denies any fall or injury.  She states that most of her tenderness      occurs with walking up stairs and walking up a grade.  Occasionally she      hears a pop or a crack when bending her knee.  The pain increases with      bending her knee.  She did notice some swelling, but no redness.  She      occasionally takes ibuprofen p.r.n. pain.  2. Rash, acute.  Ms. [name] [name] reports patches of blisters on an erythematous      background across her right chest for about 1 week.  She states the      areas are very itchy but not very tender.  The blisters are now gone      and the areas are just red and are starting to scab over.   REVIEW OF SYSTEMS:  No headache, dizziness, syncope, chest pain,  palpitations, shortness of breath, nausea, vomiting, diarrhea, constipation.  Occasional left hip pain with pop and tenderness, but then goes away and  resolves on its own.   PAST MEDICAL HISTORY:  1. Migraines, resolved.  2. Hypothyroidism.  3. Hypercholesterolemia.   HOSPITALIZATION SURGERIES PROCEDURES:  1. 2003 full hysterectomy for menorrhagia.  2. 2000 right foot surgery callus removed.  3. April of 2007 DEXA positive 1.7.  4. Mammogram in April of 2007 negative.  5. Hemoccult cards done in 1999, negative.   ALLERGIES:  No known drug  allergies.   MEDICATIONS:  1. Calcium 600 mg daily with vitamin D.  2. Vitamin E 400 International Units daily.  3. Folic acid 400 mcg daily.  4. Vitamin B12 1000 mcg daily.  5. Aspirin 325 mg daily.  6. Multivitamin daily.  7. Levothyroxine 125 mcg daily.   FAMILY HISTORY:  Father alive at age 15 with MI after age 52.  He does have  several stents and deals with hypertension.  Her mother is alive at age 60  with an MI also after age 66.  She has several stents as well and elevated  cholesterol.  She has one brother and one sister.  The sister is healthy,  but the brother has high cholesterol.  There is no  family history of any  type of cancer.   SOCIAL HISTORY:  She is an Research scientist (medical) at [employer].  She has been  married for 13 years and denies domestic abuse.  She has no children.  She  does not get regular exercise, but used to before she injured her knee.  She  would like to walk more frequently than she does right now.   DIET:  She eats three meals per day including fruits, vegetables, and water.  She does have a McDonald's biscuit about one time per week.  She has a 15-20  pack-year history of smoking, but quit quite some time ago.  She drinks  alcohol in the form of 2-3 glasses of wine on a weekend day.  She denies  drug abuse.   PHYSICAL EXAMINATION:  VITAL SIGNS:  Height 68 inches, weight 222, making  BMI 34, blood pressure 132/80, pulse 72, temperature 98.4.  GENERAL:  Obese appearing female in no acute distress.  HEENT:  PERRLA, extraocular muscles intact, oropharynx clear.  Tympanic  membranes clear.  Nares clear.  No thyromegaly, no lymphadenopathy  supraclavicular or cervical.  HEART:  Regular rate and rhythm, no murmurs, rubs, or gallops.  LUNGS:  Clear to auscultation bilaterally.  No wheezes, rales, or rhonchi.  ABDOMEN:  Soft and nontender.  Normal active bowel sounds.  No  hepatosplenomegaly.  MUSCULOSKELETAL:  Tenderness to palpation over posterior joint  line in right  knee.  Negative McMurray's.  Negative anterior and posterior drawer.  Good  stability of collateral ligaments, antalgic gait.  Mild swelling, but no  erythema of right knee.  NEUROLOGY:  Alert and oriented x3.  Cranial nerves II-XII grossly intact.  Reflexes 2+.  SKIN:  Erythematous healing scabs on right anterior chest at approximately  T3 dermatome.  No vesicals.   X-RAYS:  Bilateral knee comparison shows degenerative wear and tear on both  knees with a large bone spur in the right knee.   ASSESSMENT:  1. Right knee pain.  This is most likely secondary to osteoarthritis flare      worsened by a bone spur in her right knee.  We will begin with      conservative treatment with ice, anti-inflammatories, and exercise.      She was given information about osteoarthritis and how to strengthen      her right knee.  She was given a prescription for Diclofenac 75 mg p.o.      b.i.d. p.r.n. pain.  If her pain does not improve over the next few      weeks, she will return and we can consider steroid joint injection and      physical therapy.  2. Rash.  The rash appears to be possible shingles as it is in a      dermatomal pattern on the right upper chest.  It does seem to be      resolving and she does not have significant pain with it.  She is out      of window for treatment with acyclovir.  She will let me know if pain      is not well controlled.  3. Prevention.  She is up-to-date with her mammogram.  She does not need a      Pap smear.      We did discuss colonoscopy which she will do, but she would like to      hold off on scheduling at this point.  I will obtain records  from a      previous doctor to determine when Tetanus was      last given.  She has had a DEXA scan as well as a cholesterol panel      showing normal bone density and cholesterol respectively.  She was      given a flu vaccine today.     Kerby Nora, MD    AB/MedQ  DD: 03/26/2006  DT: 03/26/2006   Job #: [id number]

## 2011-01-16 ENCOUNTER — Other Ambulatory Visit: Payer: Self-pay | Admitting: Sports Medicine

## 2011-01-16 ENCOUNTER — Ambulatory Visit
Admission: RE | Admit: 2011-01-16 | Discharge: 2011-01-16 | Disposition: A | Discharge status: Home / Self Care | Payer: BC Managed Care – PPO | Source: Ambulatory Visit | Attending: Sports Medicine | Admitting: Sports Medicine

## 2011-01-16 DIAGNOSIS — M25561 Pain in right knee: Secondary | ICD-10-CM

## 2011-04-02 ENCOUNTER — Telehealth: Payer: Self-pay | Admitting: *Deleted

## 2011-04-02 ENCOUNTER — Other Ambulatory Visit: Payer: BC Managed Care – PPO

## 2011-04-02 DIAGNOSIS — E785 Hyperlipidemia, unspecified: Secondary | ICD-10-CM

## 2011-04-02 LAB — LIPID PANEL
Cholesterol: 228 mg/dL — ABNORMAL HIGH (ref 0–200)
HDL: 71 mg/dL (ref >39)
Total CHOL/HDL Ratio: 3.2 Ratio

## 2011-04-05 ENCOUNTER — Ambulatory Visit: Payer: BC Managed Care – PPO | Admitting: Family Medicine

## 2011-08-22 ENCOUNTER — Other Ambulatory Visit: Payer: Self-pay | Admitting: Family Medicine

## 2011-08-22 DIAGNOSIS — Z1231 Encounter for screening mammogram for malignant neoplasm of breast: Secondary | ICD-10-CM

## 2011-08-29 ENCOUNTER — Encounter: Payer: Self-pay | Admitting: Family Medicine

## 2011-08-29 ENCOUNTER — Ambulatory Visit (INDEPENDENT_AMBULATORY_CARE_PROVIDER_SITE_OTHER): Payer: Managed Care, Other (non HMO) | Admitting: Family Medicine

## 2011-08-29 VITALS — BP 137/86 | HR 68 | Ht 68.0 in | Wt 223.0 lb

## 2011-08-29 DIAGNOSIS — E039 Hypothyroidism, unspecified: Secondary | ICD-10-CM

## 2011-08-29 DIAGNOSIS — E785 Hyperlipidemia, unspecified: Secondary | ICD-10-CM

## 2011-08-29 LAB — COMPLETE METABOLIC PANEL WITH GFR
ALT: 24 U/L (ref 0–35)
AST: 17 U/L (ref 0–37)
Albumin: 4.2 g/dL (ref 3.5–5.2)
BUN: 19 mg/dL (ref 6–23)
CO2: 24 mEq/L (ref 19–32)
Calcium: 9.5 mg/dL (ref 8.4–10.5)
Chloride: 104 mEq/L (ref 96–112)
Creat: 0.7 mg/dL (ref 0.50–1.10)
GFR, Est African American: >89 mL/min
Potassium: 4.1 mEq/L (ref 3.5–5.3)

## 2011-08-29 LAB — LDL CHOLESTEROL, DIRECT: Direct LDL: 139 mg/dL — ABNORMAL HIGH

## 2011-08-29 NOTE — Progress Notes (Signed)
  Subjective:    Patient ID: [name] [name], female    DOB: [birth year], 56 y.o.   MRN: [id number]  HPI Hypothyroid - No concerns. No skin or hiar changes.  SLeeping well. No major fatitgue.  Good mood.  Needs to follow up on labwork as well.    Review of Systems     Objective:   Physical Exam  Constitutional: She is oriented to person, place, and time. She appears well-developed and well-nourished.  HENT:  Head: Normocephalic and atraumatic.  Neck: Neck supple. No thyromegaly present.  Cardiovascular: Normal rate, regular rhythm and normal heart sounds.   Pulmonary/Chest: Effort normal and breath sounds normal.  Lymphadenopathy:    She has no cervical adenopathy.  Neurological: She is alert and oriented to person, place, and time.  Skin: Skin is warm and dry.  Psychiatric: She has a normal mood and affect. Her behavior is normal.          Assessment & Plan:  Hypothyroid - Recheck thyroid today. Will call with results. Will ohold off on refills until get new level.   Hyperlipidemai- Recheck lipid levels today.  Will call with results.

## 2011-09-03 ENCOUNTER — Other Ambulatory Visit: Payer: Self-pay | Admitting: *Deleted

## 2011-09-03 MED ORDER — LEVOTHYROXINE SODIUM 125 MCG PO TABS: 125.0000 ug | ORAL_TABLET | Freq: Every day | ORAL | Status: DC

## 2011-09-04 ENCOUNTER — Telehealth: Payer: Self-pay | Admitting: *Deleted

## 2011-09-04 MED ORDER — SIMVASTATIN 40 MG PO TABS: 40.0000 mg | ORAL_TABLET | Freq: Every evening | ORAL | Status: DC

## 2011-09-04 NOTE — Telephone Encounter (Signed)
Pt had labs and you wanted her to start a cholesterol med. She is ok with this and needs med sent to [facility] pharmacy.

## 2011-09-04 NOTE — Telephone Encounter (Signed)
rx sent

## 2011-10-01 ENCOUNTER — Ambulatory Visit
Admission: RE | Admit: 2011-10-01 | Discharge: 2011-10-01 | Disposition: A | Discharge status: Home / Self Care | Payer: Managed Care, Other (non HMO) | Source: Ambulatory Visit | Attending: Family Medicine | Admitting: Family Medicine

## 2011-10-01 DIAGNOSIS — Z1231 Encounter for screening mammogram for malignant neoplasm of breast: Secondary | ICD-10-CM

## 2011-10-02 ENCOUNTER — Other Ambulatory Visit: Payer: Self-pay | Admitting: *Deleted

## 2011-10-02 MED ORDER — SIMVASTATIN 40 MG PO TABS: 40.0000 mg | ORAL_TABLET | Freq: Every evening | ORAL | Status: DC

## 2011-10-21 ENCOUNTER — Telehealth: Payer: Self-pay | Admitting: *Deleted

## 2011-10-21 NOTE — Telephone Encounter (Signed)
fthat sounds fair. Stop med and see if feels better. We can always try a different statin. Most of the time we can find one that doesn't cause any SE but may take time to try a couple.

## 2011-10-21 NOTE — Telephone Encounter (Signed)
LMOM

## 2011-10-21 NOTE — Telephone Encounter (Signed)
Pt states that ever since she started taking Zocor that her legs have felt like lead and her feet hurt. She also states that she has had symptoms of the shingles but without the blisters. States she will be stopping the med to see if this clears up. States she just began a new job and when she gets insurance she will come in to discuss other options.

## 2012-01-24 ENCOUNTER — Telehealth: Payer: Self-pay | Admitting: *Deleted

## 2012-01-24 NOTE — Telephone Encounter (Signed)
Can use lamisil on cracks of mouth, just don't get into mouth. If not improvin make appt.

## 2012-01-24 NOTE — Telephone Encounter (Signed)
Pt states she had gotten what looked to be a ringworm on her leg and some vaginal itching. Then her lips got chapped and they also cracked at the side of her mouth. She states that the ring worm is almost gone and the itching is gone. States she got on web md and starting using cortizone on the cracks. States it started healing but know it is getting worse again. Please advise on what pt should do.

## 2012-01-24 NOTE — Telephone Encounter (Signed)
Pt informed

## 2012-02-24 ENCOUNTER — Telehealth: Payer: Self-pay | Admitting: *Deleted

## 2012-02-24 DIAGNOSIS — E785 Hyperlipidemia, unspecified: Secondary | ICD-10-CM

## 2012-02-24 DIAGNOSIS — E039 Hypothyroidism, unspecified: Secondary | ICD-10-CM

## 2012-02-24 DIAGNOSIS — R030 Elevated blood-pressure reading, without diagnosis of hypertension: Secondary | ICD-10-CM

## 2012-02-24 NOTE — Telephone Encounter (Signed)
Let get lipoids, cmp, tsh. Thank you.

## 2012-02-24 NOTE — Telephone Encounter (Signed)
Pt has appt next week and wants to get labs done so you will have available to discuss. What labs do you want and I will put them in

## 2012-02-26 ENCOUNTER — Ambulatory Visit (INDEPENDENT_AMBULATORY_CARE_PROVIDER_SITE_OTHER): Payer: BC Managed Care – PPO | Admitting: Family Medicine

## 2012-02-26 ENCOUNTER — Other Ambulatory Visit: Payer: Self-pay | Admitting: Family Medicine

## 2012-02-26 ENCOUNTER — Encounter: Payer: Self-pay | Admitting: Family Medicine

## 2012-02-26 VITALS — BP 156/90 | HR 77 | Ht 69.0 in | Wt 225.0 lb

## 2012-02-26 DIAGNOSIS — I10 Essential (primary) hypertension: Secondary | ICD-10-CM

## 2012-02-26 DIAGNOSIS — E039 Hypothyroidism, unspecified: Secondary | ICD-10-CM

## 2012-02-26 DIAGNOSIS — N760 Acute vaginitis: Secondary | ICD-10-CM

## 2012-02-26 LAB — COMPLETE METABOLIC PANEL WITH GFR
AST: 23 U/L (ref 0–37)
Albumin: 4.6 g/dL (ref 3.5–5.2)
Alkaline Phosphatase: 80 U/L (ref 39–117)
Calcium: 9.9 mg/dL (ref 8.4–10.5)
Chloride: 104 mEq/L (ref 96–112)
Potassium: 4.9 mEq/L (ref 3.5–5.3)
Sodium: 139 mEq/L (ref 135–145)
Total Protein: 6.9 g/dL (ref 6.0–8.3)

## 2012-02-26 LAB — LIPID PANEL
Total CHOL/HDL Ratio: 4.2 Ratio
VLDL: 25 mg/dL (ref 0–40)

## 2012-02-26 LAB — TSH: TSH: 1.565 u[IU]/mL (ref 0.350–4.500)

## 2012-02-26 MED ORDER — LEVOTHYROXINE SODIUM 125 MCG PO TABS: 125.0000 ug | ORAL_TABLET | Freq: Every day | ORAL | Status: DC

## 2012-02-26 MED ORDER — FLUCONAZOLE 150 MG PO TABS: 150.0000 mg | ORAL_TABLET | Freq: Once | ORAL | Status: DC

## 2012-02-26 MED ORDER — LISINOPRIL 20 MG PO TABS: 20.0000 mg | ORAL_TABLET | Freq: Every day | ORAL | Status: DC

## 2012-02-26 NOTE — Progress Notes (Signed)
  Subjective:    Patient ID: [name] [name], female    DOB: 01-05-1956, 56 y.o.   MRN: [id number]  HPI 6 weeks ago had ringworm on her left inner leg and then had cracked itchy rah on the side of her mouth.  Says started with vaginal itch as well.  Went and got cortisone and then got an antifungal - no relief. Then the rash on her leg and mouth cleared up. Still had vag itching. Them tried clotrimazole and started that 3 days ago and has used it today.  She denies any recent antibiotic use.  Elevated BP - BP has been high last several timse she hsa been here. No CP or SOB  Hypothyroidism-she says she's doing well on her current dose. No weight changes. No skin or hair changes. Review of Systems     Objective:   Physical Exam  Constitutional: She is oriented to person, place, and time. She appears well-developed and well-nourished.  HENT:  Head: Normocephalic and atraumatic.  Cardiovascular: Normal rate, regular rhythm and normal heart sounds.   Pulmonary/Chest: Effort normal and breath sounds normal.  Genitourinary:       No vaginal irritation or lesion. Wet prep collected.   Neurological: She is alert and oriented to person, place, and time.  Skin: Skin is warm and dry.  Psychiatric: She has a normal mood and affect. Her behavior is normal.          Assessment & Plan:  HTN- New Dx. Discussed DASH diet and low salt diet. H.O given. Will start lisinopril 20mg  afn f/uin 6 weeks for BP check.  aslo encouraged diet and exercise and weight loss.   Hypothyroid - went for labs this AM.  Due for refills.  Sent ot mail order.   Vaginitis - Will send wet prep.  BC she has been on clotrimazole will go ahead and send over diflucant 150mg  x 1.

## 2012-02-26 NOTE — Patient Instructions (Addendum)

## 2012-02-27 MED ORDER — ATORVASTATIN CALCIUM 40 MG PO TABS: 40.0000 mg | ORAL_TABLET | Freq: Every day | ORAL | Status: DC

## 2012-02-27 NOTE — Telephone Encounter (Signed)
Pt notified of results. Pt stopped the simvastatin a long time ago due to muscle and joint aches. Says her parents is on generic Lipitor and it works good for them. Request to have something on the 10.00 list at [facility] in [place]

## 2012-02-27 NOTE — Telephone Encounter (Signed)
Ok will send over generic lipitor. Don't know if on the $10 list or not.

## 2012-02-27 NOTE — Addendum Note (Signed)
Addended by: Nani Gasser D on: 02/27/2012 02:48 PM   Modules accepted: Orders

## 2012-02-28 ENCOUNTER — Other Ambulatory Visit: Payer: Self-pay | Admitting: *Deleted

## 2012-02-28 LAB — WET PREP, GENITAL

## 2012-02-28 MED ORDER — ATORVASTATIN CALCIUM 40 MG PO TABS: 40.0000 mg | ORAL_TABLET | Freq: Every day | ORAL | Status: DC

## 2012-03-02 ENCOUNTER — Ambulatory Visit: Payer: Managed Care, Other (non HMO) | Admitting: Family Medicine

## 2012-04-08 ENCOUNTER — Ambulatory Visit (INDEPENDENT_AMBULATORY_CARE_PROVIDER_SITE_OTHER): Payer: BC Managed Care – PPO | Admitting: Family Medicine

## 2012-04-08 VITALS — BP 123/71 | HR 90 | Wt 229.0 lb

## 2012-04-08 DIAGNOSIS — I10 Essential (primary) hypertension: Secondary | ICD-10-CM

## 2012-04-08 MED ORDER — LISINOPRIL 20 MG PO TABS: 20.0000 mg | ORAL_TABLET | Freq: Every day | ORAL | Status: DC

## 2012-04-08 MED ORDER — ATORVASTATIN CALCIUM 40 MG PO TABS: 40.0000 mg | ORAL_TABLET | Freq: Every day | ORAL | Status: DC

## 2012-04-08 NOTE — Progress Notes (Signed)
  Subjective:    Patient ID: [name] [name], female    DOB: 1955/12/18, 56 y.o.   MRN: [id number]   Adiba is here today for follow up on blood pressure. She denies chest pain, shortness of breath, headaches or vision changes. She would like her refills sent to her mail order. She will need a refill on Lisinopril and Lipitor. 5 minutes spent with patient.   HPI    Review of Systems     Objective:   Physical Exam        Assessment & Plan:  HTN - Well controlled OK for refills.  Nani Gasser, MD

## 2012-05-29 ENCOUNTER — Other Ambulatory Visit: Payer: Self-pay | Admitting: *Deleted

## 2012-05-29 MED ORDER — LEVOTHYROXINE SODIUM 125 MCG PO TABS: 125.0000 ug | ORAL_TABLET | Freq: Every day | ORAL | Status: DC

## 2012-05-29 MED ORDER — ATORVASTATIN CALCIUM 40 MG PO TABS: 40.0000 mg | ORAL_TABLET | Freq: Every day | ORAL | Status: DC

## 2012-05-29 MED ORDER — LISINOPRIL 20 MG PO TABS: 20.0000 mg | ORAL_TABLET | Freq: Every day | ORAL | Status: DC

## 2012-08-25 ENCOUNTER — Telehealth: Payer: Self-pay | Admitting: *Deleted

## 2012-08-25 NOTE — Telephone Encounter (Signed)
Pt calls & states that she is taking lisinopril & that she is having extreme difficulty swallowing.  Pt wants to know if there is another bp med that she can take instead. Please advise.

## 2012-08-25 NOTE — Telephone Encounter (Signed)
Does she feel like her throat is swollen of is the pill to larger?

## 2012-08-25 NOTE — Telephone Encounter (Signed)
Pt states that she "feels like there is a giant cotton ball" in the middle of her throat.  She states this didn't start until she started the statin & bp med. She states that she wakes up in the middle of the night because she is unable to swallow.

## 2012-08-25 NOTE — Telephone Encounter (Signed)
Patient notified by myself after hours.  This swollen throat has been going on for months since starting lisinopril, worsening on a weekly basis.  She will stop taking lisinopril immediately.  She does not want a replacement yet, wants to see how her BP does over the next seven days.  Agrees to come see me or a partner for readings over 140/90 do discuss alternative such as HCTZ.

## 2012-09-17 ENCOUNTER — Telehealth: Payer: Self-pay | Admitting: Family Medicine

## 2012-09-17 MED ORDER — HYDROCHLOROTHIAZIDE 25 MG PO TABS: 25.0000 mg | ORAL_TABLET | Freq: Every day | ORAL | Status: DC

## 2012-09-17 NOTE — Telephone Encounter (Signed)
Please call patient. I did receive her that about her recent blood pressures. They're definitely above goal. I will send her for new prescription to her pharmacy for blood pressure medication. Continue to track blood pressures and if she can fax me those numbers again in about 2 weeks on the medication that would be great. Prescription sent to [facility] pharmacy. Needs to be seen in about a month on the new medication. I know she plans on making an appointment sometime soon per her fax.

## 2012-09-17 NOTE — Telephone Encounter (Signed)
Pt notified of results and instructions. Kimberly Gordon, LPN  

## 2012-09-18 ENCOUNTER — Other Ambulatory Visit: Payer: Self-pay | Admitting: Family Medicine

## 2012-09-18 DIAGNOSIS — Z1231 Encounter for screening mammogram for malignant neoplasm of breast: Secondary | ICD-10-CM

## 2012-10-07 ENCOUNTER — Telehealth: Payer: Self-pay | Admitting: *Deleted

## 2012-10-07 DIAGNOSIS — Z0000 Encounter for general adult medical examination without abnormal findings: Secondary | ICD-10-CM

## 2012-10-07 DIAGNOSIS — E039 Hypothyroidism, unspecified: Secondary | ICD-10-CM

## 2012-10-07 DIAGNOSIS — E785 Hyperlipidemia, unspecified: Secondary | ICD-10-CM

## 2012-10-07 NOTE — Telephone Encounter (Signed)
Patient has CPE scheduled with you on 31st and will go to get labs done on Tueasday 27th. What labs would you like and diagnosis and I will enter and fax to lab. Thanks. Barry Dienes, LPN

## 2012-10-07 NOTE — Telephone Encounter (Signed)
CMP and lipids and TSH. Use V70.0 and 272.4 and hypothyroid

## 2012-10-07 NOTE — Telephone Encounter (Signed)
Pt notified and labs entered. Barry Dienes, LPN

## 2012-10-13 ENCOUNTER — Other Ambulatory Visit: Payer: Self-pay | Admitting: Family Medicine

## 2012-10-13 ENCOUNTER — Ambulatory Visit: Payer: BC Managed Care – PPO

## 2012-10-13 LAB — COMPLETE METABOLIC PANEL WITH GFR
ALT: 26 U/L (ref 0–35)
Albumin: 4.2 g/dL (ref 3.5–5.2)
CO2: 26 mEq/L (ref 19–32)
Calcium: 9.7 mg/dL (ref 8.4–10.5)
Chloride: 104 mEq/L (ref 96–112)
GFR, Est African American: >89 mL/min
Potassium: 4.3 mEq/L (ref 3.5–5.3)
Sodium: 138 mEq/L (ref 135–145)
Total Bilirubin: 0.6 mg/dL (ref 0.3–1.2)
Total Protein: 6.7 g/dL (ref 6.0–8.3)

## 2012-10-14 LAB — LIPID PANEL
HDL: 59 mg/dL (ref >39)
Total CHOL/HDL Ratio: 2.4 Ratio
Triglycerides: 107 mg/dL (ref <150)

## 2012-10-14 NOTE — Progress Notes (Signed)
Quick Note:  All labs are normal. ______ 

## 2012-10-16 ENCOUNTER — Ambulatory Visit (INDEPENDENT_AMBULATORY_CARE_PROVIDER_SITE_OTHER): Payer: Managed Care, Other (non HMO) | Admitting: Family Medicine

## 2012-10-16 ENCOUNTER — Encounter: Payer: Self-pay | Admitting: Family Medicine

## 2012-10-16 VITALS — BP 125/62 | HR 81 | Ht 69.0 in | Wt 237.0 lb

## 2012-10-16 DIAGNOSIS — Z0000 Encounter for general adult medical examination without abnormal findings: Secondary | ICD-10-CM

## 2012-10-16 DIAGNOSIS — R635 Abnormal weight gain: Secondary | ICD-10-CM

## 2012-10-16 DIAGNOSIS — E118 Type 2 diabetes mellitus with unspecified complications: Secondary | ICD-10-CM | POA: Insufficient documentation

## 2012-10-16 DIAGNOSIS — H409 Unspecified glaucoma: Secondary | ICD-10-CM

## 2012-10-16 DIAGNOSIS — R7301 Impaired fasting glucose: Secondary | ICD-10-CM

## 2012-10-16 LAB — POCT GLYCOSYLATED HEMOGLOBIN (HGB A1C): Hemoglobin A1C: 6.1

## 2012-10-16 MED ORDER — LEVOTHYROXINE SODIUM 125 MCG PO TABS: 125.0000 ug | ORAL_TABLET | Freq: Every day | ORAL | Status: DC

## 2012-10-16 MED ORDER — PHENTERMINE HCL 37.5 MG PO CAPS: 37.5000 mg | ORAL_CAPSULE | ORAL | Status: DC

## 2012-10-16 MED ORDER — HYDROCHLOROTHIAZIDE 25 MG PO TABS: 25.0000 mg | ORAL_TABLET | Freq: Every day | ORAL | Status: DC

## 2012-10-16 MED ORDER — ATORVASTATIN CALCIUM 40 MG PO TABS: 40.0000 mg | ORAL_TABLET | Freq: Every day | ORAL | Status: DC

## 2012-10-16 NOTE — Addendum Note (Signed)
Addended by: Deno Etienne on: 10/16/2012 09:28 AM   Modules accepted: Orders

## 2012-10-16 NOTE — Addendum Note (Signed)
Addended by: Nani Gasser D on: 10/16/2012 09:44 AM   Modules accepted: Level of Service

## 2012-10-16 NOTE — Progress Notes (Addendum)
Subjective:     [name] [name] is a 57 y.o. female and is here for a comprehensive physical exam. The patient reports problems - Wants to discuss a medication to help boost her weight loss. She says she started walking for exercise and is trying to watch what she eats but has found at the age of 39 it's been really difficult for her to lose weight. She no she's significantly overweight and really does want to work on this. She denies any history of heart problems. No chest pain or shortness of breath recently. She's never taken a prescription medication for weight loss.Marland Kitchen  History   Social History  . Marital Status: Married    Spouse Name: divorced    Number of Children: N/A  . Years of Education: N/A   Occupational History  .     Social History Main Topics  . Smoking status: Former Smoker    Quit date: 05/20/1994  . Smokeless tobacco: Never Used  . Alcohol Use: 1.8 oz/week    3 Glasses of wine per week     Comment: 2-3 glasses of wine on weekends  . Drug Use: No  . Sexually Active: Not Currently   Other Topics Concern  . Not on file   Social History Narrative   Regular exercise, walking   Health Maintenance  Topic Date Due  . Influenza Vaccine  01/18/2013  . Mammogram  09/30/2013  . Tetanus/tdap  07/02/2017  . Colonoscopy  08/16/2019    The following portions of the patient's history were reviewed and updated as appropriate: allergies, current medications, past family history, past medical history, past social history, past surgical history and problem list.  Review of Systems A comprehensive review of systems was negative.   Objective:    BP 125/62  Pulse 81  Ht 5\' 9"  (1.753 m)  Wt 237 lb (107.502 kg)  BMI 34.98 kg/m2 General appearance: alert, cooperative and appears stated age Head: Normocephalic, without obvious abnormality, atraumatic Eyes: conj clear, EOMi, PEERLA Ears: normal TM's and external ear canals both ears Nose: Nares normal. Septum midline.  Mucosa normal. No drainage or sinus tenderness. Throat: lips, mucosa, and tongue normal; teeth and gums normal Neck: no adenopathy, no carotid bruit, no JVD, supple, symmetrical, trachea midline and thyroid not enlarged, symmetric, no tenderness/mass/nodules Back: symmetric, no curvature. ROM normal. No CVA tenderness. Lungs: clear to auscultation bilaterally Breasts: normal appearance, no masses or tenderness Heart: regular rate and rhythm, S1, S2 normal, no murmur, click, rub or gallop Abdomen: soft, non-tender; bowel sounds normal; no masses,  no organomegaly Extremities: extremities normal, atraumatic, no cyanosis or edema Pulses: 2+ and symmetric Skin: Skin color, texture, turgor normal. No rashes or lesions Lymph nodes: Cervical, supraclavicular, and axillary nodes normal. Neurologic: Alert and oriented X 3, normal strength and tone. Normal symmetric reflexes. Normal coordination and gait    Assessment:    Healthy female exam.      Plan:     See After Visit Summary for Counseling Recommendations  Keep up a regular exercise program and make sure you are eating a healthy diet Try to eat 4 servings of dairy a day, or if you are lactose intolerant take a calcium with vitamin D daily.  Your vaccines are up to date.   Abnormal weight gain - Discussed weight loss medicaiton. Discussed things that we could consider using. Any of these would have to be done in combination with a healthy diet and regular exercise for her be successful. I discussed  some of the over-the-counter options. She is most interested in phentermine. We discussed the risks and benefits of the medication. She's to stop immediately if she experiences any chest pain or shortness of breath. Fortunately, she has had no history of heart problems or palpitations. She is to followup in one month for nurse blood pressure and weight check. She will need to do this monthly. If she is unsuccessful in losing weight after one month on  the medication than the medication will need to be discontinued and she is aware of this. If the third month she will need followup with M.D.  IFG - A1C today is 6.1. Discussed new dx. discussed to cut back on concentrated sweets and reduce portion sizes on carbohydrates. Discussed the importance of weight loss and regular exercise. We will repeat the A1c in 6 months. As the importance of monitoring for progression to diabetes.

## 2012-10-16 NOTE — Patient Instructions (Signed)
Check with insurance to see if they cover it the Shingles vaccine.   Keep up a regular exercise program and make sure you are eating a healthy diet Try to eat 4 servings of dairy a day, or if you are lactose intolerant take a calcium with vitamin D daily.  Your vaccines are up to date.

## 2012-10-20 ENCOUNTER — Ambulatory Visit: Payer: BC Managed Care – PPO

## 2012-11-09 ENCOUNTER — Encounter: Payer: Self-pay | Admitting: Family Medicine

## 2012-11-09 ENCOUNTER — Ambulatory Visit (INDEPENDENT_AMBULATORY_CARE_PROVIDER_SITE_OTHER): Payer: Managed Care, Other (non HMO) | Admitting: Family Medicine

## 2012-11-09 VITALS — BP 135/88 | HR 90 | Wt 226.0 lb

## 2012-11-09 DIAGNOSIS — I10 Essential (primary) hypertension: Secondary | ICD-10-CM

## 2012-11-09 DIAGNOSIS — R635 Abnormal weight gain: Secondary | ICD-10-CM

## 2012-11-09 MED ORDER — PHENTERMINE HCL 37.5 MG PO CAPS: 37.5000 mg | ORAL_CAPSULE | ORAL | Status: DC

## 2012-11-09 NOTE — Progress Notes (Signed)
  Subjective:    Patient ID: [name] [name], female    DOB: 14-May-1956, 57 y.o.   MRN: [id number]  HPI  Abnormal weight gain -tolerating phentermine well.  She is feeling full more often. Decreased cravings.  Goal is around 150 for her weight.  No CP or SOB or palpitations.  She broke her toe and is tno walking so has been using her bike 3-4 days per week. Has cut out fried foods and is watching her portion sizes. Marland Kitchen   HTN-  Pt denies chest pain, SOB, dizziness, or heart palpitations.  Taking meds as directed w/o problems.  Denies medication side effects.    Review of Systems     Objective:   Physical Exam  Constitutional: She is oriented to person, place, and time. She appears well-developed and well-nourished.  HENT:  Head: Normocephalic and atraumatic.  Cardiovascular: Normal rate, regular rhythm and normal heart sounds.   Pulmonary/Chest: Effort normal and breath sounds normal.  Neurological: She is alert and oriented to person, place, and time.  Skin: Skin is warm and dry.  Psychiatric: She has a normal mood and affect. Her behavior is normal.          Assessment & Plan:  Abnormal weight gain - 11 lb weight loss.  Great job. Med: Will continue phentermine. F/U in 1 o for BP and weight check. Exercise: Continue to work out 3-4 days a week. Encouraged her to try to be consistent with four-day if possible.  Diet:  Portion control but not counting calories.   HTN - repeat blood pressure is within normal limits. I do want to definitely keep it tight eye on this especially with the phentermine as it can raise blood pressure. Make sure eating low salt diet. Hopefully she continues to lose weight blood pressure will improve as well. Off of the actually decrease her blood pressure medication. She's doing fantastic job so far. Keep up the good work.

## 2012-12-10 ENCOUNTER — Ambulatory Visit (INDEPENDENT_AMBULATORY_CARE_PROVIDER_SITE_OTHER): Payer: Managed Care, Other (non HMO) | Admitting: Family Medicine

## 2012-12-10 VITALS — BP 123/82 | HR 90 | Wt 222.0 lb

## 2012-12-10 DIAGNOSIS — R635 Abnormal weight gain: Secondary | ICD-10-CM

## 2012-12-10 MED ORDER — PHENTERMINE HCL 37.5 MG PO CAPS: 37.5000 mg | ORAL_CAPSULE | ORAL | Status: DC

## 2012-12-10 NOTE — Progress Notes (Signed)
  Subjective:    Patient ID: [name] [name], female    DOB: [birth year], 57 y.o.   MRN: [id number] Pt in today for weight/bp check.  Her weight is 222 lbs & bp is 123/82 p 90. Geneticist, molecular, CMA HPI    Review of Systems     Objective:   Physical Exam        Assessment & Plan:  Abnormal weight gain-she has been working out regularly and has made some changes to her diet. She was a little frustrated she has only lost 4 pounds today. Blood pressure is well-controlled. Okay for refill on phentermine today. Followup in one month for blood pressure and weight check. Nani Gasser, MD

## 2012-12-29 ENCOUNTER — Ambulatory Visit (INDEPENDENT_AMBULATORY_CARE_PROVIDER_SITE_OTHER): Payer: Managed Care, Other (non HMO) | Admitting: Family Medicine

## 2012-12-29 ENCOUNTER — Encounter: Payer: Self-pay | Admitting: Family Medicine

## 2012-12-29 VITALS — BP 137/84 | HR 79 | Ht 69.0 in | Wt 219.0 lb

## 2012-12-29 DIAGNOSIS — R300 Dysuria: Secondary | ICD-10-CM

## 2012-12-29 DIAGNOSIS — N390 Urinary tract infection, site not specified: Secondary | ICD-10-CM

## 2012-12-29 DIAGNOSIS — R319 Hematuria, unspecified: Secondary | ICD-10-CM

## 2012-12-29 LAB — POCT URINALYSIS DIPSTICK
Nitrite, UA: NEGATIVE
Protein, UA: NEGATIVE
Urobilinogen, UA: 0.2
pH, UA: 7

## 2012-12-29 MED ORDER — PHENTERMINE HCL 37.5 MG PO CAPS: 37.5000 mg | ORAL_CAPSULE | ORAL | Status: DC

## 2012-12-29 MED ORDER — CIPROFLOXACIN HCL 500 MG PO TABS: 500.0000 mg | ORAL_TABLET | Freq: Two times a day (BID) | ORAL | Status: AC

## 2012-12-29 NOTE — Patient Instructions (Signed)
Urinary Tract Infection  Urinary tract infections (UTIs) can develop anywhere along your urinary tract. Your urinary tract is your body's drainage system for removing wastes and extra water. Your urinary tract includes two kidneys, two ureters, a bladder, and a urethra. Your kidneys are a pair of bean-shaped organs. Each kidney is about the size of your fist. They are located below your ribs, one on each side of your spine.  CAUSES  Infections are caused by microbes, which are microscopic organisms, including fungi, viruses, and bacteria. These organisms are so small that they can only be seen through a microscope. Bacteria are the microbes that most commonly cause UTIs.  SYMPTOMS   Symptoms of UTIs may vary by age and gender of the patient and by the location of the infection. Symptoms in young women typically include a frequent and intense urge to urinate and a painful, burning feeling in the bladder or urethra during urination. Older women and men are more likely to be tired, shaky, and weak and have muscle aches and abdominal pain. A fever may mean the infection is in your kidneys. Other symptoms of a kidney infection include pain in your back or sides below the ribs, nausea, and vomiting.  DIAGNOSIS  To diagnose a UTI, your caregiver will ask you about your symptoms. Your caregiver also will ask to provide a urine sample. The urine sample will be tested for bacteria and white blood cells. White blood cells are made by your body to help fight infection.  TREATMENT   Typically, UTIs can be treated with medication. Because most UTIs are caused by a bacterial infection, they usually can be treated with the use of antibiotics. The choice of antibiotic and length of treatment depend on your symptoms and the type of bacteria causing your infection.  HOME CARE INSTRUCTIONS   If you were prescribed antibiotics, take them exactly as your caregiver instructs you. Finish the medication even if you feel better after you  have only taken some of the medication.   Drink enough water and fluids to keep your urine clear or pale yellow.   Avoid caffeine, tea, and carbonated beverages. They tend to irritate your bladder.   Empty your bladder often. Avoid holding urine for long periods of time.   Empty your bladder before and after sexual intercourse.   After a bowel movement, women should cleanse from front to back. Use each tissue only once.  SEEK MEDICAL CARE IF:    You have back pain.   You develop a fever.   Your symptoms do not begin to resolve within 3 days.  SEEK IMMEDIATE MEDICAL CARE IF:    You have severe back pain or lower abdominal pain.   You develop chills.   You have nausea or vomiting.   You have continued burning or discomfort with urination.  MAKE SURE YOU:    Understand these instructions.   Will watch your condition.   Will get help right away if you are not doing well or get worse.  Document Released: 02/13/2005 Document Revised: 11/05/2011 Document Reviewed: 06/14/2011  ExitCare Patient Information 2014 [employer].

## 2012-12-29 NOTE — Progress Notes (Signed)
  Subjective:    Patient ID: [name] [name], female    DOB: [birth year], 57 y.o.   MRN: [id number]  HPI Woke up with pain in hte bladder area. Had dysuria when she went.  Noticed some blood tinge this Am.  No Back pain or fever with it. No recent ABX use.  Drinks plenty of water. No recent diarrhea sxs.     Review of Systems     Objective:   Physical Exam  Constitutional: She is oriented to person, place, and time. She appears well-developed and well-nourished.  HENT:  Head: Normocephalic and atraumatic.  Cardiovascular: Normal rate, regular rhythm and normal heart sounds.   Pulmonary/Chest: Effort normal and breath sounds normal.  Musculoskeletal:  No CVA tenderness  Neurological: She is alert and oriented to person, place, and time.  Skin: Skin is warm and dry.  Psychiatric: She has a normal mood and affect. Her behavior is normal.          Assessment & Plan:  UTI - Will tx with Cipro BID x days.  All is not better in one week. Make sure drinking plenty of fluids. Call if any concerns or questions.

## 2013-01-07 ENCOUNTER — Ambulatory Visit: Payer: Managed Care, Other (non HMO)

## 2013-02-02 ENCOUNTER — Ambulatory Visit (INDEPENDENT_AMBULATORY_CARE_PROVIDER_SITE_OTHER): Payer: Managed Care, Other (non HMO) | Admitting: Family Medicine

## 2013-02-02 VITALS — Wt 212.0 lb

## 2013-02-02 DIAGNOSIS — R635 Abnormal weight gain: Secondary | ICD-10-CM

## 2013-02-02 MED ORDER — PHENTERMINE HCL 37.5 MG PO CAPS: 37.5000 mg | ORAL_CAPSULE | ORAL | Status: DC

## 2013-02-02 NOTE — Progress Notes (Signed)
Patient was in office for weight check, she wants to know if you can send her Rx to [facility]. She also says that she is unable to access her weight history from  [facility]. Sherhonda Gaspar,CMA   Abnormal weight gain-she's doing fantastic. She's lost 7 pounds. We will need to monitor her blood pressure carefully. She can follow back up in one month for repeat blood pressure and weight check. Okay to refill phentermine today. Nani Gasser, MD

## 2013-03-01 ENCOUNTER — Other Ambulatory Visit: Payer: Self-pay | Admitting: Family Medicine

## 2013-03-01 DIAGNOSIS — Z1231 Encounter for screening mammogram for malignant neoplasm of breast: Secondary | ICD-10-CM

## 2013-03-02 ENCOUNTER — Encounter: Payer: Self-pay | Admitting: Family Medicine

## 2013-03-08 ENCOUNTER — Ambulatory Visit: Payer: Managed Care, Other (non HMO)

## 2013-03-09 ENCOUNTER — Ambulatory Visit: Payer: Managed Care, Other (non HMO)

## 2013-03-11 ENCOUNTER — Ambulatory Visit (INDEPENDENT_AMBULATORY_CARE_PROVIDER_SITE_OTHER): Payer: Managed Care, Other (non HMO) | Admitting: Family Medicine

## 2013-03-11 ENCOUNTER — Encounter: Payer: Self-pay | Admitting: *Deleted

## 2013-03-11 VITALS — BP 145/86 | HR 90 | Wt 207.0 lb

## 2013-03-11 DIAGNOSIS — R635 Abnormal weight gain: Secondary | ICD-10-CM

## 2013-03-11 DIAGNOSIS — Z23 Encounter for immunization: Secondary | ICD-10-CM

## 2013-03-11 NOTE — Progress Notes (Signed)
  Subjective:    Patient ID: [name] [name], female    DOB: 1955/09/14, 57 y.o.   MRN: [id number] Pt in for a 1 month weight & bp check.  She is down 5lbs from last visit.  Denies any side effects from the phentermine.  [name] [name], CMA HPI    Review of Systems     Objective:   Physical Exam        Assessment & Plan:  Abnormal weight gain-doing very well. Has lost 5 pounds. Okay refill phentermine. Followup in one month for blood pressure and weight check. Nani Gasser, MD

## 2013-03-13 ENCOUNTER — Other Ambulatory Visit: Payer: Self-pay | Admitting: Family Medicine

## 2013-03-14 ENCOUNTER — Other Ambulatory Visit: Payer: Self-pay | Admitting: Family Medicine

## 2013-03-15 ENCOUNTER — Other Ambulatory Visit: Payer: Self-pay | Admitting: Family Medicine

## 2013-03-15 MED ORDER — PHENTERMINE HCL 37.5 MG PO CAPS: 37.5000 mg | ORAL_CAPSULE | ORAL | Status: DC

## 2013-03-25 ENCOUNTER — Other Ambulatory Visit: Payer: Self-pay | Admitting: Family Medicine

## 2013-03-25 ENCOUNTER — Ambulatory Visit (INDEPENDENT_AMBULATORY_CARE_PROVIDER_SITE_OTHER): Payer: Managed Care, Other (non HMO)

## 2013-03-25 DIAGNOSIS — Z1231 Encounter for screening mammogram for malignant neoplasm of breast: Secondary | ICD-10-CM

## 2013-04-07 ENCOUNTER — Ambulatory Visit: Payer: Managed Care, Other (non HMO)

## 2013-04-19 ENCOUNTER — Encounter: Payer: Self-pay | Admitting: Family Medicine

## 2013-04-19 ENCOUNTER — Ambulatory Visit (INDEPENDENT_AMBULATORY_CARE_PROVIDER_SITE_OTHER): Payer: Managed Care, Other (non HMO) | Admitting: Family Medicine

## 2013-04-19 VITALS — BP 140/82 | HR 93 | Temp 98.2°F | Ht 69.0 in | Wt 207.0 lb

## 2013-04-19 DIAGNOSIS — I10 Essential (primary) hypertension: Secondary | ICD-10-CM

## 2013-04-19 DIAGNOSIS — H0520 Unspecified exophthalmos: Secondary | ICD-10-CM

## 2013-04-19 DIAGNOSIS — R7301 Impaired fasting glucose: Secondary | ICD-10-CM

## 2013-04-19 DIAGNOSIS — R635 Abnormal weight gain: Secondary | ICD-10-CM

## 2013-04-19 LAB — POCT GLYCOSYLATED HEMOGLOBIN (HGB A1C): Hemoglobin A1C: 5.9

## 2013-04-19 MED ORDER — HYDROCHLOROTHIAZIDE 25 MG PO TABS: 25.0000 mg | ORAL_TABLET | Freq: Every day | ORAL | Status: DC

## 2013-04-19 MED ORDER — PHENTERMINE HCL 37.5 MG PO CAPS: 37.5000 mg | ORAL_CAPSULE | ORAL | Status: DC

## 2013-04-19 NOTE — Patient Instructions (Signed)
My [employer]itness Pal - smart phone app. 

## 2013-04-19 NOTE — Progress Notes (Signed)
   Subjective:    Patient ID: Max Sane, female    DOB: Jun 24, 1955, 57 y.o.   MRN: [id number]  HPI Abnormal weight loss - She did get down to 203 but then gained about 4 lb around Thanksgiving.   Pt denies chest pain, SOB, dizziness, or heart palpitations.  Taking meds as directed w/o problems.  Denies medication side effects.   Exercise - rides stationary bike 6 days/week.  Burns usually 500-700 calories.    Habits - No sodas or sweet tea.  Has been really focused on portion sizes but not sure how many calories are eating.    HTN- ran out of hctz about 4 days ago.  No CP or SOB.  Normally takes meds regularly.   IFG- no increased thirst or urination. Dong her A1c carefully every 6 months.  Review of Systems     Objective:   Physical Exam  Constitutional: She is oriented to person, place, and time. She appears well-developed and well-nourished.  HENT:  Head: Normocephalic and atraumatic.  Cardiovascular: Normal rate, regular rhythm and normal heart sounds.   Pulmonary/Chest: Effort normal and breath sounds normal.  Neurological: She is alert and oriented to person, place, and time.  Skin: Skin is warm and dry.  Psychiatric: She has a normal mood and affect. Her behavior is normal.          Assessment & Plan:  Abnormal weight loss - Discussed working on acutal calorie counting. Recommend smart phone app called My Fitness Pal.  F/U in one mo for nurse viit. Will refill phentermine x 1 month but if not losing then will discontinue.  Great job on exercise component.  Continue to work on portion sizes and cutting back on sweets.  HTN - Refill meds since out for 4 days.   Normally well controlled. F/U in 6 mo.  IFG - well controlled today.  F/u in 6 months.  Lab Results  Component Value Date   HGBA1C 5.9 04/19/2013

## 2013-04-20 ENCOUNTER — Telehealth: Payer: Self-pay | Admitting: Family Medicine

## 2013-04-20 ENCOUNTER — Other Ambulatory Visit: Payer: Self-pay | Admitting: Family Medicine

## 2013-04-20 MED ORDER — HYDROCHLOROTHIAZIDE 25 MG PO TABS: 25.0000 mg | ORAL_TABLET | Freq: Every day | ORAL | Status: DC

## 2013-04-20 NOTE — Telephone Encounter (Signed)
Call [facility] pharmacy and cancel rx for HCTZ.  I sent it to wrong pharmacy. Was supposed to go to [facility]

## 2013-04-21 NOTE — Telephone Encounter (Signed)
LM on VM for pharmacy to cancel HCTZ.  Meyer Cory, LPN

## 2013-04-30 LAB — TSH: TSH: 1.45 u[IU]/mL (ref 0.350–4.500)

## 2013-04-30 LAB — T3, FREE: T3, Free: 3.4 pg/mL (ref 2.3–4.2)

## 2013-05-02 ENCOUNTER — Other Ambulatory Visit: Payer: Self-pay | Admitting: Family Medicine

## 2013-05-03 LAB — THYROID PEROXIDASE ANTIBODY: Thyroperoxidase Ab SerPl-aCnc: 84.4 IU/mL — ABNORMAL HIGH (ref <35.0)

## 2013-05-10 ENCOUNTER — Encounter: Payer: Self-pay | Admitting: Family Medicine

## 2013-05-17 ENCOUNTER — Ambulatory Visit (INDEPENDENT_AMBULATORY_CARE_PROVIDER_SITE_OTHER): Payer: Managed Care, Other (non HMO) | Admitting: Family Medicine

## 2013-05-17 VITALS — BP 139/77 | HR 106 | Wt 206.0 lb

## 2013-05-17 DIAGNOSIS — R634 Abnormal weight loss: Secondary | ICD-10-CM

## 2013-05-17 MED ORDER — PHENTERMINE HCL 37.5 MG PO CAPS: 37.5000 mg | ORAL_CAPSULE | ORAL | Status: DC

## 2013-05-17 NOTE — Progress Notes (Signed)
Called patient home number left a message on vm that no more refills will be given on Phentermine unless more than 1 lb has been lost. Rx has been faxed to [facility]. Rhonda Cunningham,CMA

## 2013-05-17 NOTE — Progress Notes (Signed)
Patient came in for weight  and blood pressure check, she request that her Phentermine be sent to [facility] pharmacy. Rhonda Cunningham,CMA   Please tell pt: only 1 lb weight loss.  Will refill but has to lose more than one lb for next refill.  Nani Gasser, MD

## 2013-06-21 ENCOUNTER — Ambulatory Visit (INDEPENDENT_AMBULATORY_CARE_PROVIDER_SITE_OTHER): Payer: Managed Care, Other (non HMO) | Admitting: Family Medicine

## 2013-06-21 VITALS — BP 166/88 | HR 90 | Ht 69.0 in | Wt 201.0 lb

## 2013-06-21 DIAGNOSIS — R635 Abnormal weight gain: Secondary | ICD-10-CM

## 2013-06-21 MED ORDER — PHENTERMINE HCL 37.5 MG PO CAPS: 37.5000 mg | ORAL_CAPSULE | ORAL | Status: DC

## 2013-06-21 NOTE — Addendum Note (Signed)
Addended by: Doree Albee on: 06/21/2013 09:19 AM   Modules accepted: Orders

## 2013-06-21 NOTE — Progress Notes (Signed)
   Subjective:    Patient ID: [name] [name], female    DOB: Oct 01, 1955, 59 y.o.   MRN: [id number]  HPI    Review of Systems     Objective:   Physical Exam        Assessment & Plan:  Abnormal weight gain-overall she's doing well. She's lost 5 pounds which is fantastic. Blood pressure is a little bit elevated today. We'll go ahead and refill medication and she can follow up in one month. Blood pressure still elevated at that time we need to consider discontinuing medication. Make sure following low salt diet and taking blood pressure medications it regularly.  Beatrice Lecher, MD

## 2013-06-21 NOTE — Progress Notes (Signed)
Patient was in office for weight and blood pressure check. Patient reported nothing unusual. Patient stated that she was very anxious about her weight as the reason why her blood pressure was high. I repeated the blood pressure twice. Latoiya Maradiaga,CMA

## 2013-07-21 ENCOUNTER — Other Ambulatory Visit: Payer: Self-pay | Admitting: *Deleted

## 2013-07-21 ENCOUNTER — Ambulatory Visit (INDEPENDENT_AMBULATORY_CARE_PROVIDER_SITE_OTHER): Payer: Managed Care, Other (non HMO) | Admitting: Family Medicine

## 2013-07-21 VITALS — BP 137/85 | HR 92 | Wt 198.0 lb

## 2013-07-21 DIAGNOSIS — R635 Abnormal weight gain: Secondary | ICD-10-CM

## 2013-07-21 DIAGNOSIS — E669 Obesity, unspecified: Secondary | ICD-10-CM

## 2013-07-21 MED ORDER — PHENTERMINE HCL 37.5 MG PO CAPS: 37.5000 mg | ORAL_CAPSULE | ORAL | Status: DC

## 2013-07-21 NOTE — Progress Notes (Signed)
   Subjective:    Patient ID: [name] [name], female    DOB: 03-07-1956, 58 y.o.   MRN: [id number] Pt here today for BP and Wt check for refill on Phentermine. No c/o any SE from medication. Pt informed to schedule next appt with provider for f/u on Wt loss medication. Pt down 3lbs from visit 1 month ago.  Oscar La, LPN  HPI    Review of Systems     Objective:   Physical Exam        Assessment & Plan:  Abnormal weight gain-overall doing well. On 3 pounds. Refill phentermine today. Followup in one month for blood pressure weight check. Beatrice Lecher, MD

## 2013-08-27 ENCOUNTER — Ambulatory Visit (INDEPENDENT_AMBULATORY_CARE_PROVIDER_SITE_OTHER): Payer: Managed Care, Other (non HMO) | Admitting: Family Medicine

## 2013-08-27 ENCOUNTER — Encounter: Payer: Self-pay | Admitting: Family Medicine

## 2013-08-27 VITALS — BP 144/84 | HR 88 | Wt 196.0 lb

## 2013-08-27 DIAGNOSIS — L988 Other specified disorders of the skin and subcutaneous tissue: Secondary | ICD-10-CM

## 2013-08-27 DIAGNOSIS — R635 Abnormal weight gain: Secondary | ICD-10-CM

## 2013-08-27 DIAGNOSIS — L908 Other atrophic disorders of skin: Secondary | ICD-10-CM

## 2013-08-27 DIAGNOSIS — L918 Other hypertrophic disorders of the skin: Secondary | ICD-10-CM

## 2013-08-27 MED ORDER — LEVOTHYROXINE SODIUM 125 MCG PO TABS: 125.0000 ug | ORAL_TABLET | Freq: Every day | ORAL | Status: DC

## 2013-08-27 MED ORDER — PHENTERMINE HCL 37.5 MG PO CAPS: 37.5000 mg | ORAL_CAPSULE | ORAL | Status: DC

## 2013-08-27 NOTE — Progress Notes (Signed)
   Subjective:    Patient ID: [name] [name], female    DOB: August 15, 1955, 58 y.o.   MRN: [id number]  HPI Abnormal weight gain-  Has only lost 2 lbs.  Says there have been a lot of treats at work and has been eating some.  Says this is her busy season at work.  No CP or palpitations on the medications.  Still working out. Watching her salt.  Has lost 41 lbs.    She also has some creasing between the eyebrows. And has some lid lag on her right eye.  She wants to know what can do about it.   Review of Systems     Objective:   Physical Exam  Constitutional: She is oriented to person, place, and time. She appears well-developed and well-nourished.  HENT:  Head: Normocephalic and atraumatic.  Cardiovascular: Normal rate, regular rhythm and normal heart sounds.   Pulmonary/Chest: Effort normal and breath sounds normal.  Neurological: She is alert and oriented to person, place, and time.  Skin: Skin is warm and dry.  Psychiatric: She has a normal mood and affect. Her behavior is normal.          Assessment & Plan:  Abnormal weight gaine.  BP up a little today. Will refill phentermine and f/u in 1 mo with the nurse visit. She is doing great overall with 41 lbs weight loss in one year. I am so proud of her.  For the eyelid lag most common treatment with the blepharoplasty. I recommended Dr. Melchor Amour at [facility] ear nose and throat. He is a Psychiatric nurse here locally that is a good job.  For the creasing collagen fillers would be most appropriate and would probably work well. To her that these are not permanent there have to be repeated for maintenance. This can be done through dermatology or Dr. Carolan Clines may even do this.

## 2013-08-29 ENCOUNTER — Encounter: Payer: Self-pay | Admitting: Family Medicine

## 2013-09-24 ENCOUNTER — Ambulatory Visit: Payer: Managed Care, Other (non HMO)

## 2013-10-01 ENCOUNTER — Ambulatory Visit (INDEPENDENT_AMBULATORY_CARE_PROVIDER_SITE_OTHER): Payer: Managed Care, Other (non HMO) | Admitting: Family Medicine

## 2013-10-01 VITALS — BP 133/77 | HR 79 | Wt 193.0 lb

## 2013-10-01 DIAGNOSIS — R635 Abnormal weight gain: Secondary | ICD-10-CM

## 2013-10-01 MED ORDER — PHENTERMINE HCL 37.5 MG PO CAPS: 37.5000 mg | ORAL_CAPSULE | ORAL | Status: DC

## 2013-10-01 NOTE — Progress Notes (Signed)
   Subjective:    Patient ID: [name] [name], female    DOB: 1955-07-17, 58 y.o.   MRN: [id number] Pt here for Phentermine Refill. Pt has lost 3 more pounds since last month. Pt has no c/o any SE.  Oscar La, LPN  HPI    Review of Systems     Objective:   Physical Exam        Assessment & Plan:  Abnormal weight gain - lost 3 lbs.  F/U in 1 mo for BP and weight check.  Beatrice Lecher, MD

## 2013-11-04 ENCOUNTER — Ambulatory Visit: Payer: Managed Care, Other (non HMO)

## 2013-11-04 ENCOUNTER — Ambulatory Visit (INDEPENDENT_AMBULATORY_CARE_PROVIDER_SITE_OTHER): Payer: Managed Care, Other (non HMO) | Admitting: Family Medicine

## 2013-11-04 VITALS — BP 153/85 | HR 73 | Ht 69.0 in | Wt 189.0 lb

## 2013-11-04 DIAGNOSIS — R635 Abnormal weight gain: Secondary | ICD-10-CM

## 2013-11-04 NOTE — Progress Notes (Signed)
Patient was in office for weight check and blood pressure check. Patient denied any symtoms from weight loss medication. Rhonda Cunningham,CMA   Abnoral weight gain- has lost 4 more lbs.  continue to work on diet and exercise.  Followup in one month for blood pressure weight check. Beatrice Lecher, MD

## 2013-11-05 ENCOUNTER — Other Ambulatory Visit: Payer: Self-pay | Admitting: *Deleted

## 2013-11-05 ENCOUNTER — Ambulatory Visit: Payer: Managed Care, Other (non HMO)

## 2013-11-05 ENCOUNTER — Other Ambulatory Visit: Payer: Self-pay | Admitting: Family Medicine

## 2013-11-05 MED ORDER — PHENTERMINE HCL 37.5 MG PO CAPS: 37.5000 mg | ORAL_CAPSULE | ORAL | Status: DC

## 2013-12-01 ENCOUNTER — Other Ambulatory Visit: Payer: Self-pay | Admitting: *Deleted

## 2013-12-01 DIAGNOSIS — Z1231 Encounter for screening mammogram for malignant neoplasm of breast: Secondary | ICD-10-CM

## 2013-12-01 DIAGNOSIS — R7301 Impaired fasting glucose: Secondary | ICD-10-CM

## 2013-12-01 DIAGNOSIS — E039 Hypothyroidism, unspecified: Secondary | ICD-10-CM

## 2013-12-01 DIAGNOSIS — R030 Elevated blood-pressure reading, without diagnosis of hypertension: Secondary | ICD-10-CM

## 2013-12-01 DIAGNOSIS — E785 Hyperlipidemia, unspecified: Secondary | ICD-10-CM

## 2013-12-06 ENCOUNTER — Ambulatory Visit (INDEPENDENT_AMBULATORY_CARE_PROVIDER_SITE_OTHER): Payer: Managed Care, Other (non HMO) | Admitting: Family Medicine

## 2013-12-06 VITALS — BP 140/81 | HR 75 | Temp 98.1°F

## 2013-12-06 DIAGNOSIS — R635 Abnormal weight gain: Secondary | ICD-10-CM

## 2013-12-06 LAB — LIPID PANEL
CHOL/HDL RATIO: 2.5 ratio
Cholesterol: 132 mg/dL (ref 0–200)
HDL: 53 mg/dL (ref >39)
LDL Cholesterol: 54 mg/dL (ref 0–99)
Triglycerides: 127 mg/dL (ref <150)
VLDL: 25 mg/dL (ref 0–40)

## 2013-12-06 LAB — COMPLETE METABOLIC PANEL WITH GFR
ALK PHOS: 89 U/L (ref 39–117)
ALT: 26 U/L (ref 0–35)
AST: 22 U/L (ref 0–37)
Albumin: 4 g/dL (ref 3.5–5.2)
BUN: 17 mg/dL (ref 6–23)
CO2: 29 mEq/L (ref 19–32)
Calcium: 9.7 mg/dL (ref 8.4–10.5)
Chloride: 101 mEq/L (ref 96–112)
Creat: 0.6 mg/dL (ref 0.50–1.10)
GFR, Est African American: >89 mL/min
GLUCOSE: 90 mg/dL (ref 70–99)
Potassium: 4 mEq/L (ref 3.5–5.3)
SODIUM: 137 meq/L (ref 135–145)
Total Bilirubin: 0.8 mg/dL (ref 0.2–1.2)
Total Protein: 6.8 g/dL (ref 6.0–8.3)

## 2013-12-06 LAB — HEMOGLOBIN A1C
Hgb A1c MFr Bld: 6.1 % — ABNORMAL HIGH (ref <5.7)
MEAN PLASMA GLUCOSE: 128 mg/dL — AB (ref <117)

## 2013-12-06 LAB — TSH: TSH: 0.954 u[IU]/mL (ref 0.350–4.500)

## 2013-12-06 NOTE — Progress Notes (Signed)
Pt came in today for a weight and bp check. Pt is up 3 lbs. Spoke with Dr. Madilyn Fireman and she is going to hold off refilling pt prescription right now. Pt did state that she is eating a lot of in season fruit but she also stated that she has stepped up her workout. Pt has an appointment with Dr. Madilyn Fireman on Friday July 24./Ansel Ferrall,CMA

## 2013-12-06 NOTE — Progress Notes (Signed)
   Subjective:    Patient ID: [name] [name], female    DOB: Aug 24, 1955, 58 y.o.   MRN: [id number]  HPI    Review of Systems     Objective:   Physical Exam        Assessment & Plan:  Abnormal weight gain-she had actually been doing very well with her weight loss. It sounds like she is significantly increased fruit intake which does have a lot of natural sugar. She's gained 3 pounds and she was last year. She does have an appointment on Friday so we can discuss other options at that time. I will not refill her phentermine today. In addition her blood pressure is high.  Beatrice Lecher, MD

## 2013-12-08 ENCOUNTER — Telehealth: Payer: Self-pay | Admitting: *Deleted

## 2013-12-08 NOTE — Telephone Encounter (Signed)
Called and informed pt of the following: Cholesterol looks fantastic. Thyroid and complete metabolic panel looks great. Hemoglobin A1c is 6.1 which is up from 7 months ago it was 5.9. Still in the prediabetic range. Will continue to monitor and repeat in 6 months.

## 2013-12-10 ENCOUNTER — Ambulatory Visit (INDEPENDENT_AMBULATORY_CARE_PROVIDER_SITE_OTHER): Payer: Managed Care, Other (non HMO) | Admitting: Family Medicine

## 2013-12-10 ENCOUNTER — Encounter: Payer: Self-pay | Admitting: Family Medicine

## 2013-12-10 VITALS — BP 108/56 | HR 79 | Ht 69.0 in | Wt 192.0 lb

## 2013-12-10 DIAGNOSIS — Z0000 Encounter for general adult medical examination without abnormal findings: Secondary | ICD-10-CM

## 2013-12-10 DIAGNOSIS — R350 Frequency of micturition: Secondary | ICD-10-CM

## 2013-12-10 DIAGNOSIS — R21 Rash and other nonspecific skin eruption: Secondary | ICD-10-CM

## 2013-12-10 DIAGNOSIS — R300 Dysuria: Secondary | ICD-10-CM

## 2013-12-10 DIAGNOSIS — E785 Hyperlipidemia, unspecified: Secondary | ICD-10-CM

## 2013-12-10 LAB — POCT URINALYSIS DIPSTICK
Bilirubin, UA: NEGATIVE
Blood, UA: NEGATIVE
Glucose, UA: NEGATIVE
KETONES UA: NEGATIVE
Nitrite, UA: NEGATIVE
PH UA: 5.5
Protein, UA: NEGATIVE
Spec Grav, UA: 1.025
Urobilinogen, UA: 0.2

## 2013-12-10 MED ORDER — ATORVASTATIN CALCIUM 40 MG PO TABS: ORAL_TABLET | ORAL | Status: DC

## 2013-12-10 MED ORDER — HYDROCHLOROTHIAZIDE 25 MG PO TABS: 25.0000 mg | ORAL_TABLET | Freq: Every day | ORAL | Status: DC

## 2013-12-10 MED ORDER — LEVOTHYROXINE SODIUM 125 MCG PO TABS: 125.0000 ug | ORAL_TABLET | Freq: Every day | ORAL | Status: DC

## 2013-12-10 MED ORDER — CIPROFLOXACIN HCL 500 MG PO TABS: 500.0000 mg | ORAL_TABLET | Freq: Two times a day (BID) | ORAL | Status: AC

## 2013-12-10 NOTE — Progress Notes (Signed)
Subjective:     [name] [name] is a 58 y.o. female and is here for a comprehensive physical exam. The patient reports no problems.  History   Social History  . Marital Status: Married    Spouse Name: divorced    Number of Children: N/A  . Years of Education: N/A   Occupational History  .     Social History Main Topics  . Smoking status: Former Smoker    Quit date: 05/20/1994  . Smokeless tobacco: Never Used  . Alcohol Use: 1.8 oz/week    3 Glasses of wine per week     Comment: 2-3 glasses of wine on weekends  . Drug Use: No  . Sexual Activity: Not Currently   Other Topics Concern  . Not on file   Social History Narrative   Regular exercise, walking   Health Maintenance  Topic Date Due  . Influenza Vaccine  12/18/2013  . Mammogram  03/26/2015  . Tetanus/tdap  07/02/2017  . Colonoscopy  08/16/2019    The following portions of the patient's history were reviewed and updated as appropriate: allergies, current medications, past family history, past medical history, past social history, past surgical history and problem list.  Review of Systems A comprehensive review of systems was negative.   Objective:    BP 108/56  Pulse 79  Ht 5\' 9"  (1.753 m)  Wt 192 lb (87.091 kg)  BMI 28.34 kg/m2 General appearance: alert, cooperative and appears stated age Head: Normocephalic, without obvious abnormality, atraumatic Eyes: conj clear, EOMI Ears: normal TM's and external ear canals both ears Nose: Nares normal. Septum midline. Mucosa normal. No drainage or sinus tenderness. Throat: lips, mucosa, and tongue normal; teeth and gums normal Neck: no adenopathy, no carotid bruit, no JVD, supple, symmetrical, trachea midline and thyroid not enlarged, symmetric, no tenderness/mass/nodules Back: symmetric, no curvature. ROM normal. No CVA tenderness. Lungs: clear to auscultation bilaterally Breasts: normal appearance, no masses or tenderness Heart: regular rate and rhythm, S1,  S2 normal, no murmur, click, rub or gallop Abdomen: soft, non-tender; bowel sounds normal; no masses,  no organomegaly Extremities: extremities normal, atraumatic, no cyanosis or edema Pulses: 2+ and symmetric Skin: Skin color, texture, turgor normal. No rashes or lesions Lymph nodes: Cervical, supraclavicular, and axillary nodes normal. Neurologic: Alert and oriented X 3, normal strength and tone. Normal symmetric reflexes. Normal coordination and gait    Assessment:    Healthy female exam.      Plan:     See After Visit Summary for Counseling Recommendations  complete physical examination Keep up a regular exercise program and make sure you are eating a healthy diet Try to eat 4 servings of dairy a day, or if you are lactose intolerant take a calcium with vitamin D daily.  Your vaccines are up to date.  Has had a yearly eye exam. On timilol BID.    Rash on left lower leg-she says and there for couple weeks. Initially started on the inner foot and that has now cleared up and dry. Now she has lesions on her lower leg. She feels like it's spreading upwards. She says it intermittently itchy. She has tried over-the-counter antibacterial and antifungal creams with no significant relief. KOH skin scraping performed today. We'll call with results. If not fungal and will treat with a topical cortisone cream. It has the appearance of eczema.  Hyperlipidemia-she feels like she's noticed more memory loss. She saw that the packaging on the cholesterol pill warned about memory loss. We  discussed different options. Last lipid panel looks fantastic. We could potentially hold the medication for 4-6 weeks and see if she notices a difference. Also recheck her lipids at that time.

## 2013-12-11 LAB — KOH PREP: RESULT - KOH: NONE SEEN

## 2013-12-13 ENCOUNTER — Other Ambulatory Visit: Payer: Self-pay | Admitting: Family Medicine

## 2013-12-13 MED ORDER — TRIAMCINOLONE ACETONIDE 0.5 % EX OINT: 1.0000 "application " | TOPICAL_OINTMENT | Freq: Every day | CUTANEOUS | Status: DC

## 2013-12-14 ENCOUNTER — Encounter: Payer: Self-pay | Admitting: Family Medicine

## 2013-12-14 LAB — URINE CULTURE: Colony Count: >=100000

## 2014-01-05 ENCOUNTER — Encounter: Payer: Self-pay | Admitting: Family Medicine

## 2014-01-12 ENCOUNTER — Telehealth: Payer: Self-pay | Admitting: *Deleted

## 2014-01-12 ENCOUNTER — Other Ambulatory Visit: Payer: Self-pay | Admitting: *Deleted

## 2014-01-12 ENCOUNTER — Encounter: Payer: Self-pay | Admitting: Family Medicine

## 2014-01-12 DIAGNOSIS — Z01818 Encounter for other preprocedural examination: Secondary | ICD-10-CM

## 2014-01-12 NOTE — Telephone Encounter (Signed)
Called and informed pt of appt time and date. Pt stated that she will come in early to do labs and was advised to make an appt. I called Eddie Dibbles in the lab and he gave me an 800 # to call [phone].   I called this # and scheduled the appt for 01/25/2014 @ 830 pt's conf# [id number] called pt back and gave her the # to call and the conf # should she need to reschedule her appt .Audelia Hives Mehan

## 2014-01-25 ENCOUNTER — Ambulatory Visit (INDEPENDENT_AMBULATORY_CARE_PROVIDER_SITE_OTHER): Payer: Managed Care, Other (non HMO) | Admitting: Family Medicine

## 2014-01-25 VITALS — BP 123/71 | HR 71 | Ht 69.0 in | Wt 195.0 lb

## 2014-01-25 DIAGNOSIS — Z01818 Encounter for other preprocedural examination: Secondary | ICD-10-CM

## 2014-01-25 NOTE — Progress Notes (Signed)
   Subjective:    Patient ID: [name] [name], female    DOB: [birth year], 58 y.o.   MRN: [id number] Pt here for an EKG for surgical clearance.  Beatris Ship, CMA HPI    Review of Systems     Objective:   Physical Exam        Assessment & Plan:  EKG shows NSR, rate of 67 beats per minute, normal sinus rhythm with normal axis. No acute ST-T wave changes. Unchanged from previous.  She went to the lab today for additional blodowork. Will complete forms once available.  Beatrice Lecher, MD

## 2014-01-26 LAB — CBC
HEMATOCRIT: 39.1 % (ref 36.0–46.0)
Hemoglobin: 12.8 g/dL (ref 12.0–15.0)
MCH: 29.6 pg (ref 26.0–34.0)
MCHC: 32.7 g/dL (ref 30.0–36.0)
MCV: 90.5 fL (ref 78.0–100.0)
Platelets: 385 10*3/uL (ref 150–400)
RBC: 4.32 MIL/uL (ref 3.87–5.11)
RDW: 14.2 % (ref 11.5–15.5)
WBC: 5.4 10*3/uL (ref 4.0–10.5)

## 2014-01-26 LAB — URINALYSIS
BILIRUBIN URINE: NEGATIVE
GLUCOSE, UA: NEGATIVE mg/dL
Hgb urine dipstick: NEGATIVE
KETONES UR: NEGATIVE mg/dL
Leukocytes, UA: NEGATIVE
Nitrite: NEGATIVE
PH: 6 (ref 5.0–8.0)
Protein, ur: NEGATIVE mg/dL
SPECIFIC GRAVITY, URINE: 1.009 (ref 1.005–1.030)
Urobilinogen, UA: 0.2 mg/dL (ref 0.0–1.0)

## 2014-01-26 LAB — HIV ANTIBODY (ROUTINE TESTING W REFLEX): HIV 1&2 Ab, 4th Generation: NONREACTIVE

## 2014-01-26 LAB — PROTIME-INR
INR: 1.13 (ref <1.50)
Prothrombin Time: 14.5 seconds (ref 11.6–15.2)

## 2014-02-04 ENCOUNTER — Encounter: Payer: Self-pay | Admitting: Family Medicine

## 2014-03-15 ENCOUNTER — Encounter: Payer: Self-pay | Admitting: Family Medicine

## 2014-03-29 ENCOUNTER — Ambulatory Visit: Payer: Managed Care, Other (non HMO)

## 2014-04-12 ENCOUNTER — Other Ambulatory Visit: Payer: Self-pay | Admitting: Family Medicine

## 2014-04-12 ENCOUNTER — Ambulatory Visit (INDEPENDENT_AMBULATORY_CARE_PROVIDER_SITE_OTHER): Payer: Managed Care, Other (non HMO) | Admitting: Family Medicine

## 2014-04-12 ENCOUNTER — Encounter: Payer: Self-pay | Admitting: Family Medicine

## 2014-04-12 VITALS — BP 145/75 | HR 78 | Temp 98.1°F | Ht 69.0 in | Wt 204.0 lb

## 2014-04-12 DIAGNOSIS — Z6828 Body mass index (BMI) 28.0-28.9, adult: Secondary | ICD-10-CM

## 2014-04-12 DIAGNOSIS — R635 Abnormal weight gain: Secondary | ICD-10-CM

## 2014-04-12 DIAGNOSIS — Z1231 Encounter for screening mammogram for malignant neoplasm of breast: Secondary | ICD-10-CM

## 2014-04-12 MED ORDER — LIRAGLUTIDE -WEIGHT MANAGEMENT 18 MG/3ML ~~LOC~~ SOPN
0.6000 mg | PEN_INJECTOR | Freq: Every day | SUBCUTANEOUS | Status: DC
Start: 2014-04-12 — End: 2014-04-20

## 2014-04-12 NOTE — Progress Notes (Signed)
   Subjective:    Patient ID: [name] [name], female    DOB: 1956/02/01, 58 y.o.   MRN: [id number]  HPI  58 year old female with a history of impaired fasting glucose, elevated blood pressure and hyperlipidemia who desires weight loss. She wants to talk about getting back on weight loss meds. She was on phentermine back in June.  She has gained more weight back.  Has orderd a new exercise machine. Just got it in but hasn't put it together yet.  She wants to have something to jump start her weight loss. REally wants to get to 160 lbs. she had gotten down to 189 pounds in June but now is back up to 204 pounds. That's a weight gain of 15 pounds in the last 5 months.  Review of Systems     Objective:   Physical Exam  Constitutional: She is oriented to person, place, and time. She appears well-developed and well-nourished.  HENT:  Head: Normocephalic and atraumatic.  Cardiovascular: Normal rate, regular rhythm and normal heart sounds.   Pulmonary/Chest: Effort normal and breath sounds normal.  Neurological: She is alert and oriented to person, place, and time.  Skin: Skin is warm and dry.  Psychiatric: She has a normal mood and affect. Her behavior is normal.          Assessment & Plan:  Abnormal weight gain/BMI of 28 with co-morbidities- discussed options.  Will try Saxenda. F/U in 6 weeks. Wants to send it to mail order.  Discussed potential S.E.  we also discussed the importance of getting back on track with a regular exercise routine. We also discussed getting specific-sized plate to help with portion control etc.  Time spent 20 minutes, greater 50% of time spent counseling about obesity and weight gain.

## 2014-04-13 ENCOUNTER — Ambulatory Visit (INDEPENDENT_AMBULATORY_CARE_PROVIDER_SITE_OTHER): Payer: Managed Care, Other (non HMO)

## 2014-04-13 ENCOUNTER — Ambulatory Visit: Payer: Managed Care, Other (non HMO)

## 2014-04-13 DIAGNOSIS — Z1231 Encounter for screening mammogram for malignant neoplasm of breast: Secondary | ICD-10-CM

## 2014-04-15 ENCOUNTER — Encounter: Payer: Self-pay | Admitting: Family Medicine

## 2014-04-19 ENCOUNTER — Encounter: Payer: Self-pay | Admitting: Family Medicine

## 2014-04-19 ENCOUNTER — Ambulatory Visit: Payer: Managed Care, Other (non HMO) | Admitting: Family Medicine

## 2014-04-20 MED ORDER — PHENTERMINE HCL 37.5 MG PO CAPS: 37.5000 mg | ORAL_CAPSULE | ORAL | Status: DC

## 2014-05-10 DIAGNOSIS — Z9889 Other specified postprocedural states: Secondary | ICD-10-CM | POA: Insufficient documentation

## 2014-05-10 DIAGNOSIS — H401134 Primary open-angle glaucoma, bilateral, indeterminate stage: Secondary | ICD-10-CM | POA: Insufficient documentation

## 2014-05-10 HISTORY — PX: CATARACT EXTRACTION W/ INTRAOCULAR LENS IMPLANT: SHX1309

## 2014-05-11 ENCOUNTER — Encounter: Payer: Self-pay | Admitting: Family Medicine

## 2014-05-17 ENCOUNTER — Ambulatory Visit: Payer: Managed Care, Other (non HMO)

## 2014-05-27 ENCOUNTER — Ambulatory Visit (INDEPENDENT_AMBULATORY_CARE_PROVIDER_SITE_OTHER): Payer: Managed Care, Other (non HMO) | Admitting: Family Medicine

## 2014-05-27 ENCOUNTER — Encounter: Payer: Self-pay | Admitting: Family Medicine

## 2014-05-27 VITALS — BP 122/68 | HR 91 | Ht 69.0 in | Wt 203.0 lb

## 2014-05-27 DIAGNOSIS — R635 Abnormal weight gain: Secondary | ICD-10-CM

## 2014-05-27 DIAGNOSIS — Z6829 Body mass index (BMI) 29.0-29.9, adult: Secondary | ICD-10-CM

## 2014-05-27 MED ORDER — ATORVASTATIN CALCIUM 40 MG PO TABS
ORAL_TABLET | ORAL | Status: DC
Start: 2014-05-27 — End: 2015-01-02

## 2014-05-27 MED ORDER — LEVOTHYROXINE SODIUM 125 MCG PO TABS: 125.0000 ug | ORAL_TABLET | Freq: Every day | ORAL | Status: DC

## 2014-05-27 MED ORDER — HYDROCHLOROTHIAZIDE 25 MG PO TABS: 25.0000 mg | ORAL_TABLET | Freq: Every day | ORAL | Status: DC

## 2014-05-27 NOTE — Progress Notes (Signed)
   Subjective:    Patient ID: [name] [name], female    DOB: 01-Jan-1956, 59 y.o.   MRN: [id number]  HPI 59 year old female with a history of impaired fasting glucose and elevated blood pressure and hyperlipidemia is working on weight loss. This is a 6 week follow-up after starting phentermine. We had originally tried to call and [employer] but it was not covered under her insurance. She has only lost a lb. Took whole tab for 3 days and had some insomnia. 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 She really feels she has cut back on portions.  She has been getting on her stationary bike and eliptical at home.  She did use [employer]itness [employer] for about 2 weeks.  Doesn't eat fastfood.  She has been getting protein with her breakfast.    Review of Systems     Objective:   Physical Exam  Constitutional: She is oriented to person, place, and time. She appears well-developed and well-nourished.  HENT:  Head: Normocephalic and atraumatic.  Cardiovascular: Normal rate, regular rhythm and normal heart sounds.   Pulmonary/Chest: Effort normal and breath sounds normal.  Neurological: She is alert and oriented to person, place, and time.  Skin: Skin is warm and dry.  Psychiatric: She has a normal mood and affect. Her behavior is normal.          Assessment & Plan:  Abnormal weight gain - she really does not want to continue the phentermine. She did okay on a half of a tab at says she really wants to do this on her own. She plans on really buckling down and doing her exercise daily in addition to getting back on the my fitness pal out to help her lose weight. Also discussed she may need to make sure that she's not under eating and making sure that she's making good food choices within her recommended caloric intake. Offered to refer her to a nutritionist. She says she will think about it.  Time spent 15 minutes, greater than 50% time spent counseling about abnormal weight gain and the need for  weight loss with a BMI 29.

## 2014-06-24 DIAGNOSIS — Z9842 Cataract extraction status, left eye: Secondary | ICD-10-CM | POA: Insufficient documentation

## 2014-08-31 ENCOUNTER — Encounter: Payer: Self-pay | Admitting: Family Medicine

## 2014-12-23 ENCOUNTER — Telehealth: Payer: Self-pay | Admitting: Family Medicine

## 2014-12-23 DIAGNOSIS — E039 Hypothyroidism, unspecified: Secondary | ICD-10-CM

## 2014-12-23 DIAGNOSIS — R7301 Impaired fasting glucose: Secondary | ICD-10-CM

## 2014-12-23 DIAGNOSIS — E785 Hyperlipidemia, unspecified: Secondary | ICD-10-CM

## 2014-12-23 NOTE — Telephone Encounter (Signed)
Which lab orders are preferred? Thank you.

## 2014-12-23 NOTE — Telephone Encounter (Signed)
CMP, lipid, TSH, hemoglobin A 1C

## 2014-12-23 NOTE — Telephone Encounter (Signed)
Pt called to see if she can get her blood work done Monday am prior to her appt. Will route to PCP for review of what labs are needed.

## 2014-12-26 LAB — LIPID PANEL
Cholesterol: 150 mg/dL (ref 125–200)
HDL: 60 mg/dL (ref >=46)
LDL Cholesterol: 62 mg/dL (ref <130)
Total CHOL/HDL Ratio: 2.5 Ratio (ref <=5.0)
Triglycerides: 140 mg/dL (ref <150)
VLDL: 28 mg/dL (ref <30)

## 2014-12-26 LAB — CBC WITH DIFFERENTIAL/PLATELET
Basophils Absolute: 0.1 10*3/uL (ref 0.0–0.1)
Basophils Relative: 1 % (ref 0–1)
Eosinophils Absolute: 0.2 10*3/uL (ref 0.0–0.7)
Eosinophils Relative: 3 % (ref 0–5)
HCT: 40.8 % (ref 36.0–46.0)
Hemoglobin: 14 g/dL (ref 12.0–15.0)
Lymphocytes Relative: 23 % (ref 12–46)
Lymphs Abs: 1.4 10*3/uL (ref 0.7–4.0)
MCH: 29.8 pg (ref 26.0–34.0)
MCHC: 34.3 g/dL (ref 30.0–36.0)
MCV: 86.8 fL (ref 78.0–100.0)
MONO ABS: 0.7 10*3/uL (ref 0.1–1.0)
MPV: 10 fL (ref 8.6–12.4)
Monocytes Relative: 11 % (ref 3–12)
NEUTROS PCT: 62 % (ref 43–77)
Neutro Abs: 3.7 10*3/uL (ref 1.7–7.7)
Platelets: 328 10*3/uL (ref 150–400)
RBC: 4.7 MIL/uL (ref 3.87–5.11)
RDW: 14.5 % (ref 11.5–15.5)
WBC: 6 10*3/uL (ref 4.0–10.5)

## 2014-12-26 LAB — HEMOGLOBIN A1C
HEMOGLOBIN A1C: 6.4 % — AB (ref <5.7)
MEAN PLASMA GLUCOSE: 137 mg/dL — AB (ref <117)

## 2014-12-26 LAB — TSH: TSH: 1.17 u[IU]/mL (ref 0.350–4.500)

## 2014-12-26 NOTE — Telephone Encounter (Signed)
Lab orders placed.  

## 2015-01-02 ENCOUNTER — Ambulatory Visit (INDEPENDENT_AMBULATORY_CARE_PROVIDER_SITE_OTHER): Payer: Managed Care, Other (non HMO) | Admitting: Family Medicine

## 2015-01-02 ENCOUNTER — Encounter: Payer: Self-pay | Admitting: Family Medicine

## 2015-01-02 VITALS — BP 131/69 | HR 89 | Ht 69.0 in | Wt 208.0 lb

## 2015-01-02 DIAGNOSIS — E039 Hypothyroidism, unspecified: Secondary | ICD-10-CM | POA: Diagnosis not present

## 2015-01-02 DIAGNOSIS — E785 Hyperlipidemia, unspecified: Secondary | ICD-10-CM

## 2015-01-02 DIAGNOSIS — R7301 Impaired fasting glucose: Secondary | ICD-10-CM

## 2015-01-02 MED ORDER — ATORVASTATIN CALCIUM 40 MG PO TABS: ORAL_TABLET | ORAL | Status: DC

## 2015-01-02 MED ORDER — METFORMIN HCL ER 500 MG PO TB24: 500.0000 mg | ORAL_TABLET | Freq: Every day | ORAL | Status: DC

## 2015-01-02 MED ORDER — LEVOTHYROXINE SODIUM 125 MCG PO TABS: 125.0000 ug | ORAL_TABLET | Freq: Every day | ORAL | Status: DC

## 2015-01-02 MED ORDER — HYDROCHLOROTHIAZIDE 25 MG PO TABS: 25.0000 mg | ORAL_TABLET | Freq: Every day | ORAL | Status: DC

## 2015-01-02 NOTE — Progress Notes (Signed)
   Subjective:    Patient ID: [name] [name], female    DOB: 1956-05-06, 59 y.o.   MRN: [id number]  HPI Hypothyroid - currently on levothyroxin 57mcg daily.   IFG - last a1c was 6.4. Says had quit working out and she has recently restarted work out.  Says has been trying to eat on smaller plates.  Does eat occ sweats.   Hyperlipidemia. Currently on a torsed atorvastatin 40 mg at bedtime. No myalgias or S.E.    Review of Systems     Objective:   Physical Exam  Constitutional: She is oriented to person, place, and time. She appears well-developed and well-nourished.  HENT:  Head: Normocephalic and atraumatic.  Cardiovascular: Normal rate, regular rhythm and normal heart sounds.   Pulmonary/Chest: Effort normal and breath sounds normal.  Neurological: She is alert and oriented to person, place, and time.  Skin: Skin is warm and dry.  Psychiatric: She has a normal mood and affect. Her behavior is normal.          Assessment & Plan:  Hypothyroid - well controlled on current regimen.    IFG - discussed options including metformin.  Will start. Discussed S.E. F/U in 3 monts since A1C is 6.4.    Hyperlpidemia - well controlled on current regimen.

## 2015-03-13 ENCOUNTER — Other Ambulatory Visit: Payer: Self-pay | Admitting: Family Medicine

## 2015-03-13 DIAGNOSIS — Z1231 Encounter for screening mammogram for malignant neoplasm of breast: Secondary | ICD-10-CM

## 2015-03-31 ENCOUNTER — Encounter: Payer: Self-pay | Admitting: Family Medicine

## 2015-03-31 ENCOUNTER — Ambulatory Visit (INDEPENDENT_AMBULATORY_CARE_PROVIDER_SITE_OTHER): Payer: Managed Care, Other (non HMO) | Admitting: Family Medicine

## 2015-03-31 VITALS — BP 136/84 | HR 66 | Temp 98.1°F | Resp 18 | Wt 214.0 lb

## 2015-03-31 DIAGNOSIS — R030 Elevated blood-pressure reading, without diagnosis of hypertension: Secondary | ICD-10-CM | POA: Diagnosis not present

## 2015-03-31 DIAGNOSIS — R7301 Impaired fasting glucose: Secondary | ICD-10-CM

## 2015-03-31 DIAGNOSIS — Z1159 Encounter for screening for other viral diseases: Secondary | ICD-10-CM

## 2015-03-31 LAB — COMPLETE METABOLIC PANEL WITH GFR
ALBUMIN: 4.3 g/dL (ref 3.6–5.1)
ALK PHOS: 89 U/L (ref 33–130)
ALT: 18 U/L (ref 6–29)
AST: 18 U/L (ref 10–35)
BILIRUBIN TOTAL: 0.7 mg/dL (ref 0.2–1.2)
BUN: 15 mg/dL (ref 7–25)
CALCIUM: 10.1 mg/dL (ref 8.6–10.4)
CO2: 28 mmol/L (ref 20–31)
CREATININE: 0.59 mg/dL (ref 0.50–1.05)
Chloride: 109 mmol/L (ref 98–110)
GFR, Est African American: >89 mL/min (ref >=60)
GFR, Est Non African American: >89 mL/min (ref >=60)
GLUCOSE: 94 mg/dL (ref 65–99)
Potassium: 4.4 mmol/L (ref 3.5–5.3)
SODIUM: 148 mmol/L — AB (ref 135–146)
Total Protein: 6.8 g/dL (ref 6.1–8.1)

## 2015-03-31 LAB — POCT GLYCOSYLATED HEMOGLOBIN (HGB A1C): Hemoglobin A1C: 5.8

## 2015-03-31 NOTE — Progress Notes (Signed)
   Subjective:    Patient ID: [name] [name], female    DOB: Oct 17, 1955, 59 y.o.   MRN: [id number]  HPI IFG - doing well on metformin. Occ has loose stools.  She has had more frequent headaches and they have been mild and not like her migraines. She's not sure if it's from the metformin or not. But sometimes tends to get more sinus pressure and headaches in the fall anyway.  Pressures mildly elevated today. She is taking her hydrochlorothiazide. She is fasting this morning.    Review of Systems     Objective:   Physical Exam  Constitutional: She is oriented to person, place, and time. She appears well-developed and well-nourished.  HENT:  Head: Normocephalic and atraumatic.  Cardiovascular: Normal rate, regular rhythm and normal heart sounds.   Pulmonary/Chest: Effort normal and breath sounds normal.  Neurological: She is alert and oriented to person, place, and time.  Skin: Skin is warm and dry.  Psychiatric: She has a normal mood and affect. Her behavior is normal.          Assessment & Plan:  IFG -  Well controlled Due for CMP and Hep C.  Her A1C is down to 5.8. Looks fantastic. Continue with metformin since she's tolerating it well. Let me know if she feels his headaches continue to be a problem. Otherwise follow-up in 6 months. She is due for a CMP.  Elevated blood pressure- due for CMP. Repeat blood pressure looks good. She doesn't have an element of whitecoat hypertension. Continue Hydrocort thiazide.  Discussed need for hepatitis C screening but she declined. Prefers to just check liver enzymes.

## 2015-04-03 ENCOUNTER — Other Ambulatory Visit: Payer: Self-pay | Admitting: Family Medicine

## 2015-04-03 DIAGNOSIS — E870 Hyperosmolality and hypernatremia: Secondary | ICD-10-CM

## 2015-04-03 DIAGNOSIS — R948 Abnormal results of function studies of other organs and systems: Secondary | ICD-10-CM

## 2015-04-03 DIAGNOSIS — R030 Elevated blood-pressure reading, without diagnosis of hypertension: Secondary | ICD-10-CM

## 2015-04-20 ENCOUNTER — Ambulatory Visit (INDEPENDENT_AMBULATORY_CARE_PROVIDER_SITE_OTHER): Payer: Managed Care, Other (non HMO)

## 2015-04-20 DIAGNOSIS — Z1231 Encounter for screening mammogram for malignant neoplasm of breast: Secondary | ICD-10-CM | POA: Diagnosis not present

## 2015-04-25 ENCOUNTER — Encounter: Payer: Self-pay | Admitting: Family Medicine

## 2015-05-09 ENCOUNTER — Telehealth: Payer: Self-pay

## 2015-05-10 ENCOUNTER — Other Ambulatory Visit: Payer: Self-pay | Admitting: Family Medicine

## 2015-05-10 DIAGNOSIS — E870 Hyperosmolality and hypernatremia: Secondary | ICD-10-CM

## 2015-05-29 ENCOUNTER — Other Ambulatory Visit: Payer: Self-pay | Admitting: Family Medicine

## 2015-05-29 MED ORDER — HYDROCHLOROTHIAZIDE 25 MG PO TABS: 25.0000 mg | ORAL_TABLET | Freq: Every day | ORAL | Status: DC

## 2015-05-29 MED ORDER — LEVOTHYROXINE SODIUM 125 MCG PO TABS: 125.0000 ug | ORAL_TABLET | Freq: Every day | ORAL | Status: DC

## 2015-05-29 MED ORDER — ATORVASTATIN CALCIUM 40 MG PO TABS: ORAL_TABLET | ORAL | Status: DC

## 2015-07-11 ENCOUNTER — Encounter: Payer: Self-pay | Admitting: Family Medicine

## 2015-08-10 ENCOUNTER — Encounter: Payer: Self-pay | Admitting: Family Medicine

## 2015-09-11 IMAGING — MG MM DIGITAL SCREENING BILATERAL
4 series · 4 of 4 positions shown · non-contrast
Comparison: Previous exam(s).

CLINICAL DATA: Screening.

EXAM:
DIGITAL SCREENING BILATERAL MAMMOGRAM WITH CAD

[R CC]
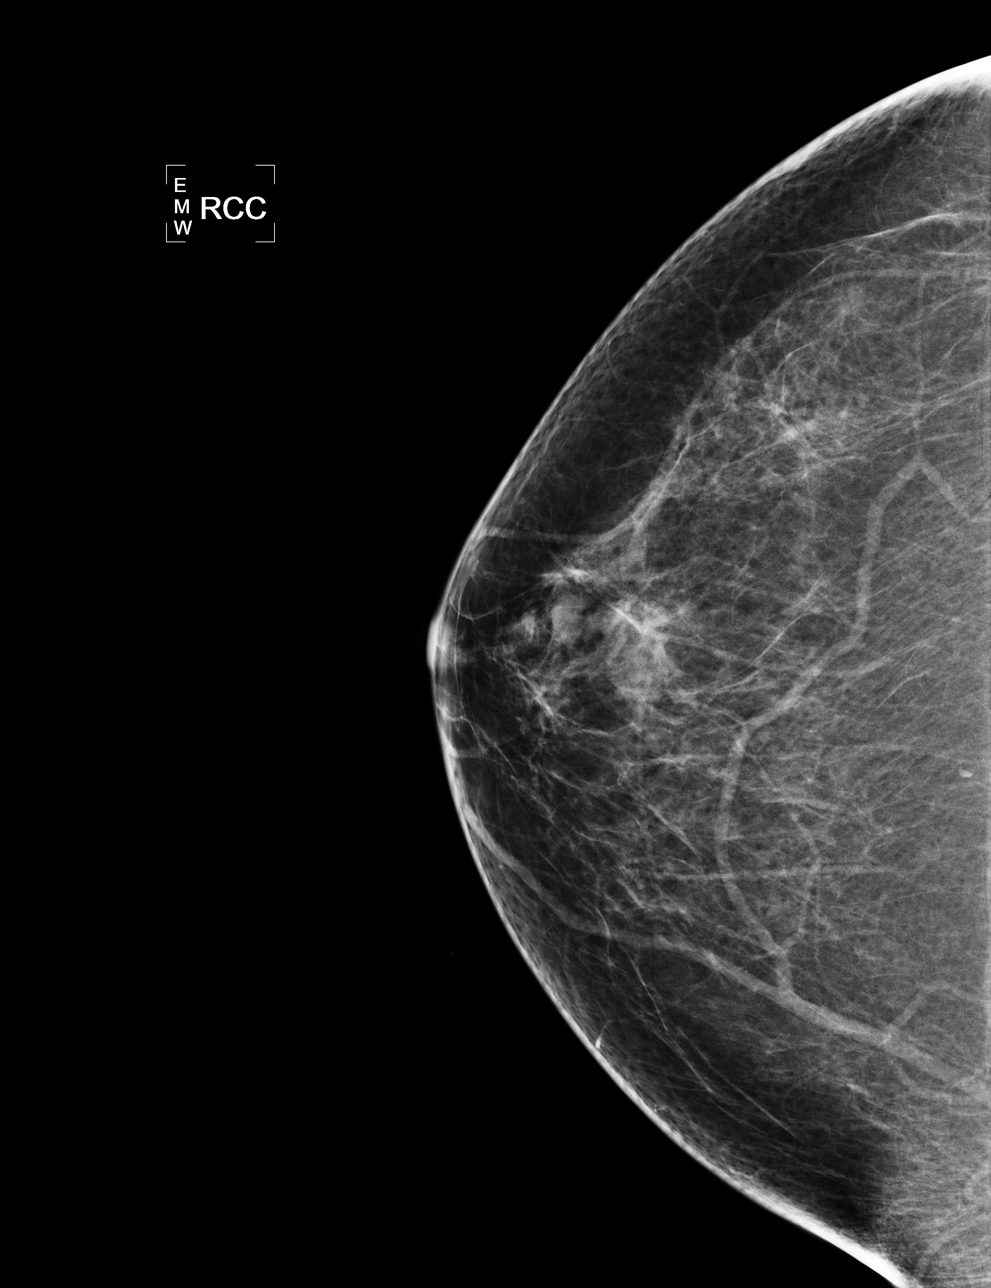

[L CC]
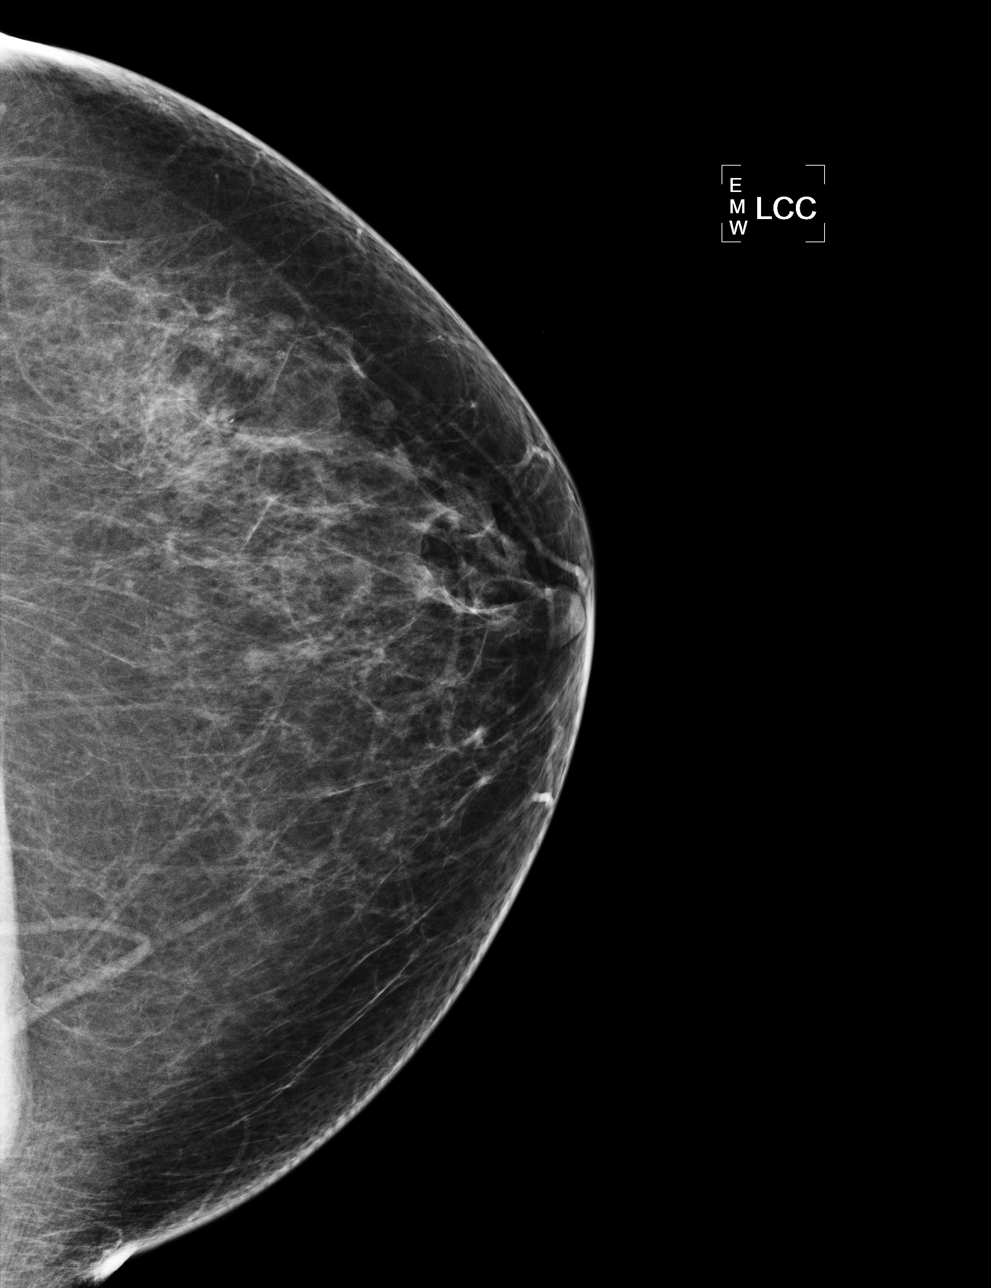

[L MLO]
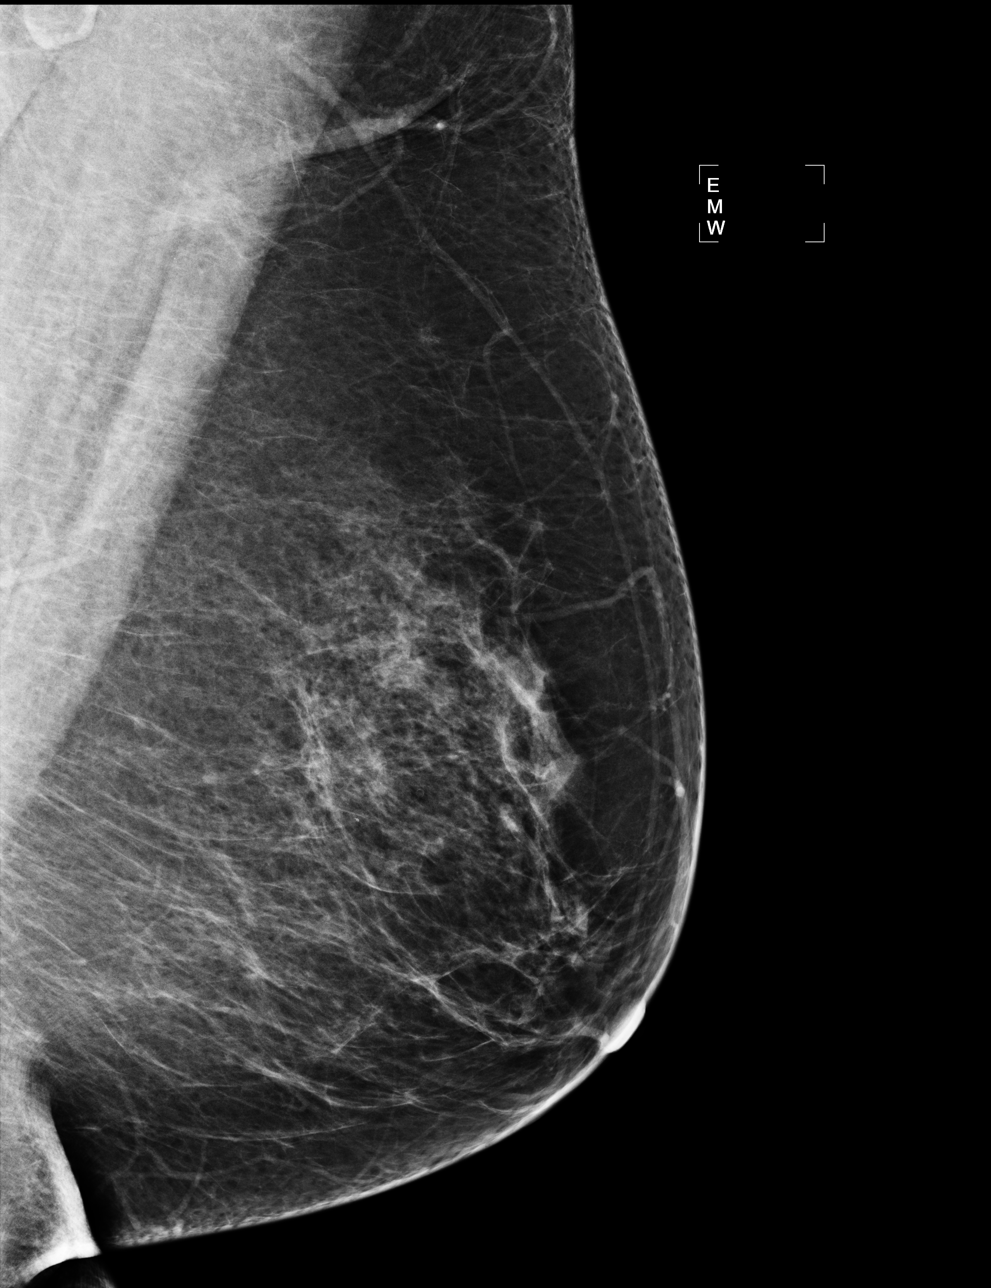

[R MLO]
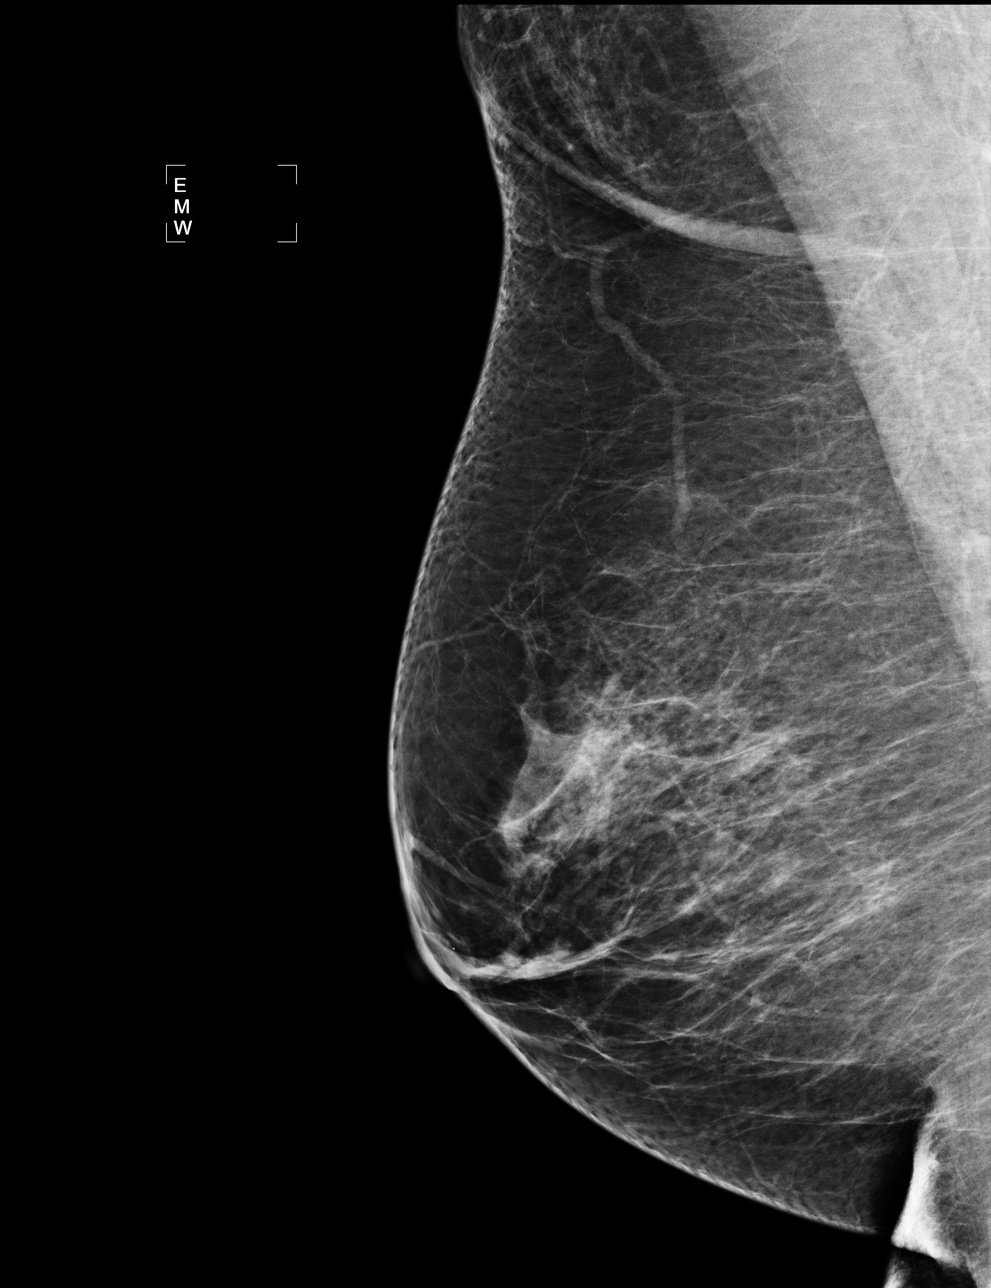

[4 of 4 positions shown; findings below may reference images not displayed]

ACR Breast Density Category b: There are scattered areas of
fibroglandular density.
FINDINGS: There are no findings suspicious for malignancy. Images were
processed with CAD.
IMPRESSION: No mammographic evidence of malignancy. A result letter of this
screening mammogram will be mailed directly to the patient.

RECOMMENDATION:
Screening mammogram in one year. (Code:AS-G-LCT)

BI-RADS CATEGORY  1: Negative.

## 2015-09-12 ENCOUNTER — Encounter: Payer: Self-pay | Admitting: Family Medicine

## 2015-09-27 ENCOUNTER — Other Ambulatory Visit: Payer: Self-pay | Admitting: *Deleted

## 2015-09-27 MED ORDER — LEVOTHYROXINE SODIUM 125 MCG PO TABS: 125.0000 ug | ORAL_TABLET | Freq: Every day | ORAL | Status: DC

## 2015-09-27 MED ORDER — METFORMIN HCL ER 500 MG PO TB24: 500.0000 mg | ORAL_TABLET | Freq: Every day | ORAL | Status: DC

## 2015-09-27 MED ORDER — HYDROCHLOROTHIAZIDE 25 MG PO TABS: 25.0000 mg | ORAL_TABLET | Freq: Every day | ORAL | Status: DC

## 2015-09-28 ENCOUNTER — Ambulatory Visit: Payer: Managed Care, Other (non HMO) | Admitting: Family Medicine

## 2015-10-04 ENCOUNTER — Ambulatory Visit: Payer: Managed Care, Other (non HMO) | Admitting: Family Medicine

## 2015-10-11 ENCOUNTER — Encounter: Payer: Self-pay | Admitting: Family Medicine

## 2015-10-11 ENCOUNTER — Ambulatory Visit (INDEPENDENT_AMBULATORY_CARE_PROVIDER_SITE_OTHER): Payer: Managed Care, Other (non HMO) | Admitting: Family Medicine

## 2015-10-11 ENCOUNTER — Ambulatory Visit (INDEPENDENT_AMBULATORY_CARE_PROVIDER_SITE_OTHER): Payer: Managed Care, Other (non HMO)

## 2015-10-11 VITALS — BP 147/104 | HR 77 | Wt 214.0 lb

## 2015-10-11 DIAGNOSIS — M79672 Pain in left foot: Secondary | ICD-10-CM

## 2015-10-11 DIAGNOSIS — E559 Vitamin D deficiency, unspecified: Secondary | ICD-10-CM

## 2015-10-11 DIAGNOSIS — IMO0001 Reserved for inherently not codable concepts without codable children: Secondary | ICD-10-CM | POA: Insufficient documentation

## 2015-10-11 DIAGNOSIS — M609 Myositis, unspecified: Secondary | ICD-10-CM

## 2015-10-11 DIAGNOSIS — M791 Myalgia: Secondary | ICD-10-CM | POA: Diagnosis not present

## 2015-10-11 DIAGNOSIS — M79671 Pain in right foot: Secondary | ICD-10-CM

## 2015-10-11 DIAGNOSIS — M25579 Pain in unspecified ankle and joints of unspecified foot: Secondary | ICD-10-CM | POA: Insufficient documentation

## 2015-10-11 NOTE — Patient Instructions (Signed)
Thank you for coming in today. STOP lipitor.  Get labs. Get xray.  Return in 2-4 weeks.   Muscle Pain, Adult Muscle pain (myalgia) may be caused by many things, including:  Overuse or muscle strain, especially if you are not in shape. This is the most common cause of muscle pain.  Injury.  Bruises.  Viruses, such as the flu.  Infectious diseases.  Fibromyalgia, which is a chronic condition that causes muscle tenderness, fatigue, and headache.  Autoimmune diseases, including lupus.  Certain drugs, including ACE inhibitors and statins. Muscle pain may be mild or severe. In most cases, the pain lasts only a short time and goes away without treatment. To diagnose the cause of your muscle pain, your health care provider will take your medical history. This means he or she will ask you when your muscle pain began and what has been happening. If you have not had muscle pain for very long, your health care provider may want to wait before doing much testing. If your muscle pain has lasted a long time, your health care provider may want to run tests right away. If your health care provider thinks your muscle pain may be caused by illness, you may need to have additional tests to rule out certain conditions.  Treatment for muscle pain depends on the cause. Home care is often enough to relieve muscle pain. Your health care provider may also prescribe anti-inflammatory medicine. HOME CARE INSTRUCTIONS Watch your condition for any changes. The following actions may help to lessen any discomfort you are feeling:  Only take over-the-counter or prescription medicines as directed by your health care provider.  Apply ice to the sore muscle:  Put ice in a plastic bag.  Place a towel between your skin and the bag.  Leave the ice on for 15-20 minutes, 3-4 times a day.  You may alternate applying hot and cold packs to the muscle as directed by your health care provider.  If overuse is causing your  muscle pain, slow down your activities until the pain goes away.  Remember that it is normal to feel some muscle pain after starting a workout program. Muscles that have not been used often will be sore at first.  Do regular, gentle exercises if you are not usually active.  Warm up before exercising to lower your risk of muscle pain.  Do not continue working out if the pain is very bad. Bad pain could mean you have injured a muscle. SEEK MEDICAL CARE IF:  Your muscle pain gets worse, and medicines do not help.  You have muscle pain that lasts longer than 3 days.  You have a rash or fever along with muscle pain.  You have muscle pain after a tick bite.  You have muscle pain while working out, even though you are in good physical condition.  You have redness, soreness, or swelling along with muscle pain.  You have muscle pain after starting a new medicine or changing the dose of a medicine. SEEK IMMEDIATE MEDICAL CARE IF:  You have trouble breathing.  You have trouble swallowing.  You have muscle pain along with a stiff neck, fever, and vomiting.  You have severe muscle weakness or cannot move part of your body. MAKE SURE YOU:   Understand these instructions.  Will watch your condition.  Will get help right away if you are not doing well or get worse.   This information is not intended to replace advice given to you by your  health care provider. Make sure you discuss any questions you have with your health care provider.   Document Released: 03/28/2006 Document Revised: 05/27/2014 Document Reviewed: 03/02/2013 Elsevier Interactive Patient Education [employer].

## 2015-10-12 LAB — FERRITIN: Ferritin: 36 ng/mL (ref 10–232)

## 2015-10-12 LAB — COMPREHENSIVE METABOLIC PANEL
ALBUMIN: 4.4 g/dL (ref 3.6–5.1)
ALT: 19 U/L (ref 6–29)
AST: 17 U/L (ref 10–35)
Alkaline Phosphatase: 76 U/L (ref 33–130)
BUN: 20 mg/dL (ref 7–25)
CHLORIDE: 103 mmol/L (ref 98–110)
CO2: 25 mmol/L (ref 20–31)
CREATININE: 0.61 mg/dL (ref 0.50–1.05)
Calcium: 10 mg/dL (ref 8.6–10.4)
Glucose, Bld: 75 mg/dL (ref 65–99)
Potassium: 4.1 mmol/L (ref 3.5–5.3)
SODIUM: 140 mmol/L (ref 135–146)
Total Bilirubin: 0.5 mg/dL (ref 0.2–1.2)
Total Protein: 6.7 g/dL (ref 6.1–8.1)

## 2015-10-12 LAB — CBC
HCT: 42.3 % (ref 35.0–45.0)
Hemoglobin: 13.9 g/dL (ref 11.7–15.5)
MCH: 29.3 pg (ref 27.0–33.0)
MCHC: 32.9 g/dL (ref 32.0–36.0)
MCV: 89.1 fL (ref 80.0–100.0)
MPV: 10.1 fL (ref 7.5–12.5)
Platelets: 328 10*3/uL (ref 140–400)
RBC: 4.75 MIL/uL (ref 3.80–5.10)
RDW: 14.1 % (ref 11.0–15.0)
WBC: 6.3 10*3/uL (ref 3.8–10.8)

## 2015-10-12 LAB — VITAMIN D 25 HYDROXY (VIT D DEFICIENCY, FRACTURES): VIT D 25 HYDROXY: 54 ng/mL (ref 30–100)

## 2015-10-12 LAB — CK: CK TOTAL: 104 U/L (ref 7–177)

## 2015-10-12 LAB — SEDIMENTATION RATE: SED RATE: 6 mm/h (ref 0–30)

## 2015-10-12 LAB — TSH: TSH: 0.52 m[IU]/L

## 2015-10-12 NOTE — Progress Notes (Signed)
Subjective:    I'm seeing this patient as a consultation for:  Dr Madilyn Fireman  CC: Thigh pain and foot pain  HPI:   1) thigh pain: Patient notes a several week history of anterior thigh pain. Pain is worse when she rises from a seated position and climb stairs. She denies significant hip pain radiating pain weakness or numbness. She thinks perhaps her pain may be related to her atorvastatin. She notes she's had trouble with previous statins. She's tried some over-the-counter medicines which have not helped much. She notes that she has a past surgical history for left hip total hip replacement.  2) foot pain: Patient has bilateral midfoot pain. This also has been present for several months without injury. Pain is worse with ambulation and better with rest. No radiating pain weakness or numbness.  Past medical history, Surgical history, Family history not pertinant except as noted below, Social history, Allergies, and medications have been entered into the medical record, reviewed, and no changes needed.   Review of Systems: No headache, visual changes, nausea, vomiting, diarrhea, constipation, dizziness, abdominal pain, skin rash, fevers, chills, night sweats, weight loss, swollen lymph nodes, body aches, joint swelling, muscle aches, chest pain, shortness of breath, mood changes, visual or auditory hallucinations.   Objective:    Filed Vitals:   10/11/15 1623  BP: 147/104  Pulse: 77   General: Well Developed, well nourished, and in no acute distress.  Neuro/Psych: Alert and oriented x3, extra-ocular muscles intact, able to move all 4 extremities, sensation grossly intact. Skin: Warm and dry, no rashes noted.  Respiratory: Not using accessory muscles, speaking in full sentences, trachea midline.  Cardiovascular: Pulses palpable, no extremity edema. Abdomen: Does not appear distended. MSK:  Back nontender to midline normal back motion Hips normal-appearing bilaterally. Normal motion.  Mild pain with internal rotation at the right hip. Thigh nontender  Knees nontender normal motion stable ligamentous exam Ankles nontender normal motion Feet slight pes planus with tarsal metatarsal bossing. Tender to palpation at the metatarsal tarsal joint and in the midfoot bilaterally. Pulses capillary refill sensation intact.   Results for orders placed or performed in visit on 10/11/15 (from the past 24 hour(s))  CBC     Status: None   Collection Time: 10/11/15  4:48 PM  Result Value Ref Range   WBC 6.3 3.8 - 10.8 K/uL   RBC 4.75 3.80 - 5.10 MIL/uL   Hemoglobin 13.9 11.7 - 15.5 g/dL   HCT 42.3 35.0 - 45.0 %   MCV 89.1 80.0 - 100.0 fL   MCH 29.3 27.0 - 33.0 pg   MCHC 32.9 32.0 - 36.0 g/dL   RDW 14.1 11.0 - 15.0 %   Platelets 328 140 - 400 K/uL   MPV 10.1 7.5 - 12.5 fL   Narrative   Performed at:  [facility], [id number]                [place], [place] [address] HAS THE PATIENT FASTED?->NO; SOURCE: BLD&BLOOD; HAS THE PATI  CK     Status: None   Collection Time: 10/11/15  4:48 PM  Result Value Ref Range   Total CK 104 7 - 177 U/L   Narrative   Performed at:  [facility], [id number]  [place], [place] [address]  Comprehensive metabolic panel     Status: None   Collection Time: 10/11/15  4:48 PM  Result Value Ref Range   Sodium 140 135 - 146 mmol/L   Potassium 4.1 3.5 - 5.3 mmol/L   Chloride 103 98 - 110 mmol/L   CO2 25 20 - 31 mmol/L   Glucose, Bld 75 65 - 99 mg/dL   BUN 20 7 - 25 mg/dL   Creat 0.61 0.50 - 1.05 mg/dL   Total Bilirubin 0.5 0.2 - 1.2 mg/dL   Alkaline Phosphatase 76 33 - 130 U/L   AST 17 10 - 35 U/L   ALT 19 6 - 29 U/L   Total Protein 6.7 6.1 - 8.1 g/dL   Albumin 4.4 3.6 - 5.1 g/dL   Calcium 10.0 8.6 - 10.4 mg/dL   Narrative   Performed at:  [facility], [id number]                [place], [place] [address]  Ferritin      Status: None   Collection Time: 10/11/15  4:48 PM  Result Value Ref Range   Ferritin 36 10 - 232 ng/mL   Narrative   Performed at:  [facility], [id number]                [place], [place] [address]  VITAMIN D 25 Hydroxy (Vit-D Deficiency, Fractures)     Status: None   Collection Time: 10/11/15  4:48 PM  Result Value Ref Range   Vit D, 25-Hydroxy 54 30 - 100 ng/mL   Narrative   Performed at:  [facility], [id number]                [place], [place] [address]  TSH     Status: None   Collection Time: 10/11/15  4:48 PM  Result Value Ref Range   TSH 0.52 mIU/L   Narrative   Performed at:  [id number]                [place], [place] [address]  Sedimentation rate     Status: None   Collection Time: 10/11/15  4:48 PM  Result Value Ref Range   Sed Rate 6 0 - 30 mm/hr   Narrative   Performed at:  [id number]                [place], [place] [address]   No results found.  Impression and Recommendations:   60 year old woman with foot pain and left thigh pain  1) thigh pain: This sounds like myalgia I think probably related to statins. There could be a referred orthopedic component as well. Plan to discontinue atorvastatin as well as obtaining a limited laboratory workup as noted above. We'll recheck in a few weeks. If symptoms resolve we'll blame Lipitor. An alternative statin may be Livalo.  2) foot pain: Likely DJD. X-ray pending bilaterally. Return in 2-4 weeks.  This case required medical decision making of moderate complexity.

## 2015-10-12 NOTE — Progress Notes (Signed)
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 ______

## 2015-10-13 NOTE — Progress Notes (Signed)
Quick Note:  Labs look ok ______

## 2015-10-19 ENCOUNTER — Ambulatory Visit (INDEPENDENT_AMBULATORY_CARE_PROVIDER_SITE_OTHER): Payer: Managed Care, Other (non HMO) | Admitting: Family Medicine

## 2015-10-19 ENCOUNTER — Encounter: Payer: Self-pay | Admitting: Family Medicine

## 2015-10-19 VITALS — BP 149/88 | HR 93 | Wt 211.0 lb

## 2015-10-19 DIAGNOSIS — M25562 Pain in left knee: Secondary | ICD-10-CM

## 2015-10-19 DIAGNOSIS — M25561 Pain in right knee: Secondary | ICD-10-CM | POA: Diagnosis not present

## 2015-10-19 DIAGNOSIS — IMO0001 Reserved for inherently not codable concepts without codable children: Secondary | ICD-10-CM

## 2015-10-19 DIAGNOSIS — M609 Myositis, unspecified: Secondary | ICD-10-CM

## 2015-10-19 DIAGNOSIS — M25579 Pain in unspecified ankle and joints of unspecified foot: Secondary | ICD-10-CM | POA: Diagnosis not present

## 2015-10-19 DIAGNOSIS — M791 Myalgia: Secondary | ICD-10-CM | POA: Diagnosis not present

## 2015-10-19 NOTE — Progress Notes (Signed)
[name] [name] is a 60 y.o. female who presents to [facility] [place]: [place] today for follow-up leg pain and foot pain and knee pain.  Leg pain: Patient was seen last week for thigh pain. After careful history and medication review the most likely diagnosis was statin intolerance due to Lipitor. She since discontinued the Lipitor feels much better. She notes her thigh pain is not completely resolved but is much improved.  Foot pain: Patient has bilateral foot pain. 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 She is interested in injections today if possible diseases worked well for her knees in the past.  Knee pain: Patient has bilateral knee pain in the past she's had injections due to DJD which worked very well. She is interested in injections as well.     Past Medical History  Diagnosis Date  . Obesity   . Migraine     hx of  . Hyperlipidemia   . Hypothyroidism   . Obesity   . Migraines    Past Surgical History  Procedure Laterality Date  . Migraines, resolved  2003  . Right foot surgery  2000    callus removed  . Dexa  4/07    (+) 1.7  . Joint replacement      L hip replacement 2011  . Abdominal hysterectomy  2005    complete  . Cataract extraction w/ intraocular lens implant Left 05/10/14   Social History  Substance Use Topics  . Smoking status: Former Smoker    Quit date: 05/20/1994  . Smokeless tobacco: Never Used  . Alcohol Use: 1.8 oz/week    3 Glasses of wine per week     Comment: 2-3 glasses of wine on weekends   family history includes CAD in her mother; Heart attack in her mother; Hyperlipidemia in her brother and mother; Hypertension in her father; Other (age of onset: 63) in her father.  ROS as above:  Medications: Current Outpatient Prescriptions  Medication Sig Dispense Refill  . Ascorbic Acid (VITAMIN C)  1000 MG tablet Take 1 tablet by mouth daily.    . Calcium Carbonate (CALCIUM 600 PO) Take by mouth.    . Cholecalciferol (D-3-5) 5000 UNITS capsule Take 5,000 Units by mouth daily.    . Cyanocobalamin (B-12) 2500 MCG TABS Take 1 tablet by mouth daily.    . fish oil-omega-3 fatty acids 1000 MG capsule Take 1 g by mouth daily.     . hydrochlorothiazide (HYDRODIURIL) 25 MG tablet Take 1 tablet (25 mg total) by mouth daily. 90 tablet 1  . levothyroxine (SYNTHROID, LEVOTHROID) 125 MCG tablet Take 1 tablet (125 mcg total) by mouth daily. 90 tablet 1  . metFORMIN (GLUCOPHAGE XR) 500 MG 24 hr tablet Take 1 tablet (500 mg total) by mouth daily with breakfast. 180 tablet 1  . Multiple Vitamin (MULTIVITAMIN) capsule Take 1 capsule by mouth daily.    . timolol (TIMOPTIC) 0.5 % ophthalmic solution     . Zinc 50 MG TABS Take 50 mg by mouth daily.     No current facility-administered medications for this visit.   Allergies  Allergen Reactions  . Lipitor [Atorvastatin] Other (See Comments)    Myalgia  . Lisinopril     Throat swelling  . Simvastatin Other (See Comments)    Myalgias and joint pain.     Exam:  BP 149/88 mmHg  Pulse 93  Wt 211 lb (95.709 kg)  Gen: Well NAD HEENT: EOMI,  MMM Lungs: Normal work of breathing. CTABL Heart: RRR no MRG Abd: NABS, Soft. Nondistended, Nontender Exts: Brisk capillary refill, warm and well perfused.  Right foot: Tarsometatarsal bossing is present. Scar from probable third MCP immobilization present and normal appearing. Second MTP is area of maximal tenderness was detailed palpated foot exam. She has pain with motion at the second MTP. Pulses capillary refill sensation are intact.  Left foot also shows tarsometatarsal [place]. She is most tender at the third and fourth tarsometatarsal joints.  Pulses capillary refill and sensation intact.  Procedure: Real-time Ultrasound Guided Injection of Right 2nd MTP  Device: GE Logiq E  Images permanently stored and  available for review in the ultrasound unit. Verbal informed consent obtained. Discussed risks and benefits of procedure. Warned about infection bleeding damage to structures skin hypopigmentation and fat atrophy among others. Patient expresses understanding and agreement Time-out conducted.  Noted no overlying erythema, induration, or other signs of local infection.  Skin prepped in a sterile fashion.  Local anesthesia: Topical Ethyl chloride.  With sterile technique and under real time ultrasound guidance: 5mg  dexamethasone and 0.72ml marcaine injected easily.  Completed without difficulty  Pain immediately resolved suggesting accurate placement of the medication.  Advised to call if fevers/chills, erythema, induration, drainage, or persistent bleeding.  Images permanently stored and available for review in the ultrasound unit.  Impression: Technically successful ultrasound guided injection.   Procedure: Real-time Ultrasound Guided Injection of Left 3rd tarsalmetatarsal joint  Device: GE Logiq E  Images permanently stored and available for review in the ultrasound unit. Verbal informed consent obtained. Discussed risks and benefits of procedure. Warned about infection bleeding damage to structures skin hypopigmentation and fat atrophy among others. Patient expresses understanding and agreement Time-out conducted.  Noted no overlying erythema, induration, or other signs of local infection.  Skin prepped in a sterile fashion.  Local anesthesia: Topical Ethyl chloride.  With sterile technique and under real time ultrasound guidance: 5mg  dexamethasone and 0.94ml marcaine injected easily.  Completed without difficulty  Pain immediately resolved suggesting accurate placement of the medication.  Advised to call if fevers/chills, erythema, induration, drainage, or persistent bleeding.  Images permanently stored and available for review in the ultrasound unit.  Impression:  Technically successful ultrasound guided injection.     No results found for this or any previous visit (from the past 24 hour(s)). No results found.    Assessment and Plan: 60 y.o. female with  1) Thigh pain: Likely due to statin intolerance. Abstain for lipitor. F/u with PCP. Consider Livalo.  2) Foot pain due to DJD. Much better with injections today.  3) Knee Pain BL. Likely due to DJD. Plan to return next week for consideration of injection.     Discussed warning signs or symptoms. Please see discharge instructions. Patient expresses understanding.

## 2015-10-19 NOTE — Patient Instructions (Signed)
Thank you for coming in today.\ Call or go to the ER if you develop a large red swollen joint with extreme pain or oozing puss.  Return later next week to work on your knees.  Stay off the lipitor.  We will probably try something called Livalo instead in the future.   Pitavastatin oral tablets What is this medicine? PITAVASTATIN (pit A va STAT in) is known as a HMG-CoA reductase inhibitor or 'statin'. It lowers the level of cholesterol and triglycerides in the blood. Diet and lifestyle changes are often used with this drug. This medicine may be used for other purposes; ask your health care provider or pharmacist if you have questions. What should I tell my health care provider before I take this medicine? They need to know if you have any of these conditions: - frequently drink alcoholic beverages - kidney disease - liver disease - muscle aches or weakness - other medical condition - an unusual or allergic reaction to pitavastatin, other medicines, foods, dyes, or preservatives - pregnant or trying to get pregnant - breast-feeding How should I use this medicine? Take this medicine by mouth with a glass of water. Follow the directions on the prescription label. You can take this medicine with or without food. Take your doses at regular intervals. Do not take your medicine more often than directed. Talk to your pediatrician regarding the use of this medicine in children. Special care may be needed. Overdosage: If you think you have taken too much of this medicine contact a poison control center or emergency room at once. NOTE: This medicine is only for you. Do not share this medicine with others. What if I miss a dose? If you miss a dose, take it as soon as you can. If it is almost time for your next dose, take only that dose. Do not take double or extra doses. What may interact with this medicine? Do not take this medicine with any of the following medications: - cyclosporine -  gemfibrozil - herbal medicines like red yeast rice This medicine may also interact with the following medications: - alcohol - antiviral medicines for HIV or AIDS - erythromycin - other medicines for cholesterol - rifampin - warfarin This list may not describe all possible interactions. Give your health care provider a list of all the medicines, herbs, non-prescription drugs, or dietary supplements you use. Also tell them if you smoke, drink alcohol, or use illegal drugs. Some items may interact with your medicine. What should I watch for while using this medicine? Visit your doctor or health care professional for regular check-ups. You may need regular tests to make sure your liver is working properly. Tell your doctor or health care professional right away if you get any unexplained muscle pain, tenderness, or weakness, especially if you also have a fever and tiredness. Your doctor or health care professional may tell you to stop taking this medicine if you develop muscle problems. If your muscle problems do not go away after stopping this medicine, contact your health care professional. This medicine may affect blood sugar levels. If you have diabetes, check with your doctor or health care professional before you change your diet or the dose of your diabetic medicine. This drug is only part of a total heart-health program. Your doctor or a dietician can suggest a low-cholesterol and low-fat diet to help. Avoid alcohol and smoking, and keep a proper exercise schedule. Do not become pregnant while taking this medicine. Women should inform their doctor  if they wish to become pregnant or think they might be pregnant. There is a potential for serious side effects to an unborn child. Do not breast-feed an infant while taking this medicine. Serious side effects to an unborn child or to an infant are possible. Talk to your doctor or pharmacist for more information. What side effects may I notice from  receiving this medicine? Side effects that you should report to your doctor or health care professional as soon as possible: - allergic reactions like skin rash, itching or hives, swelling of the face, lips, or tongue - dark urine - fever - joint pain - muscle cramps or pain - redness, blistering, peeling or loosening of the skin, including inside the mouth - trouble passing urine or change in the amount of urine - unusually weak or tired - yellowing of the eyes or skin Side effects that usually do not require medical attention (Report these to your doctor or health care professional if they continue or are bothersome.): - constipation - heartburn - nausea This list may not describe all possible side effects. Call your doctor for medical advice about side effects. You may report side effects to FDA at 1-800-FDA-1088. Where should I keep my medicine? Keep out of the reach of children. Store at room temperature between 15 and 30 degrees C (59 and 86 degrees F). Protect from light. Throw away any unused medicine after the expiration date. NOTE: This sheet is a summary. It may not cover all possible information. If you have questions about this medicine, talk to your doctor, pharmacist, or health care provider.    2016, [employer]vier/[employer] Standard. (2012-03-04 17:18:15)

## 2015-10-20 DIAGNOSIS — M25561 Pain in right knee: Secondary | ICD-10-CM | POA: Insufficient documentation

## 2015-10-20 DIAGNOSIS — M25562 Pain in left knee: Secondary | ICD-10-CM

## 2015-10-20 DIAGNOSIS — G8929 Other chronic pain: Secondary | ICD-10-CM | POA: Insufficient documentation

## 2015-10-24 ENCOUNTER — Ambulatory Visit (INDEPENDENT_AMBULATORY_CARE_PROVIDER_SITE_OTHER): Payer: Managed Care, Other (non HMO) | Admitting: Family Medicine

## 2015-10-24 ENCOUNTER — Encounter: Payer: Self-pay | Admitting: Family Medicine

## 2015-10-24 VITALS — BP 165/96 | HR 76 | Wt 212.0 lb

## 2015-10-24 DIAGNOSIS — IMO0001 Reserved for inherently not codable concepts without codable children: Secondary | ICD-10-CM

## 2015-10-24 DIAGNOSIS — M25561 Pain in right knee: Secondary | ICD-10-CM | POA: Diagnosis not present

## 2015-10-24 DIAGNOSIS — M25562 Pain in left knee: Secondary | ICD-10-CM | POA: Diagnosis not present

## 2015-10-24 DIAGNOSIS — M609 Myositis, unspecified: Secondary | ICD-10-CM

## 2015-10-24 DIAGNOSIS — M791 Myalgia: Secondary | ICD-10-CM | POA: Diagnosis not present

## 2015-10-24 DIAGNOSIS — M25579 Pain in unspecified ankle and joints of unspecified foot: Secondary | ICD-10-CM | POA: Diagnosis not present

## 2015-10-24 NOTE — Patient Instructions (Signed)
Thank you for coming in today. Call or go to the ER if you develop a large red swollen joint with extreme pain or oozing puss.  Return in 2-4 weeks if not better.  Return sooner if worse.

## 2015-10-24 NOTE — Progress Notes (Signed)
[name] [name] is a 60 y.o. female who presents to [facility] today for thigh pain foot pain and knee pain.  1) foot pain: Patient was seen last week and had injections in her feet bilaterally. She is much better nearly 100% pain free.  2) knee pain: Patient notes continued bilateral knee pain. She is interested in joint injection today if possible as she had good results with foot injection last week.  3) thigh pain: Seen recently thought to be due to statins. Lipitor was discontinued about 2 weeks ago. She notes that her thigh pain is completely resolved. She has an appointment with her PCP soon.  4) elevated blood pressure: Patient notes elevated blood pressure today. She states she has white coat hypertension would like to follow this issue up with her PCP.   Past Medical History  Diagnosis Date  . Obesity   . Migraine     hx of  . Hyperlipidemia   . Hypothyroidism   . Obesity   . Migraines    Past Surgical History  Procedure Laterality Date  . Migraines, resolved  2003  . Right foot surgery  2000    callus removed  . Dexa  4/07    (+) 1.7  . Joint replacement      L hip replacement 2011  . Abdominal hysterectomy  2005    complete  . Cataract extraction w/ intraocular lens implant Left 05/10/14   Social History  Substance Use Topics  . Smoking status: Former Smoker    Quit date: 05/20/1994  . Smokeless tobacco: Never Used  . Alcohol Use: 1.8 oz/week    3 Glasses of wine per week     Comment: 2-3 glasses of wine on weekends   family history includes CAD in her mother; Heart attack in her mother; Hyperlipidemia in her brother and mother; Hypertension in her father; Other (age of onset: 73) in her father.  ROS:  No headache, visual changes, nausea, vomiting, diarrhea, constipation, dizziness, abdominal pain, skin rash, fevers, chills, night sweats, weight loss, swollen lymph nodes, body aches, joint swelling, muscle  aches, chest pain, shortness of breath, mood changes, visual or auditory hallucinations.    Medications: Current Outpatient Prescriptions  Medication Sig Dispense Refill  . Ascorbic Acid (VITAMIN C) 1000 MG tablet Take 1 tablet by mouth daily.    . Calcium Carbonate (CALCIUM 600 PO) Take by mouth.    . Cholecalciferol (D-3-5) 5000 UNITS capsule Take 5,000 Units by mouth daily.    . Cyanocobalamin (B-12) 2500 MCG TABS Take 1 tablet by mouth daily.    . fish oil-omega-3 fatty acids 1000 MG capsule Take 1 g by mouth daily.     . hydrochlorothiazide (HYDRODIURIL) 25 MG tablet Take 1 tablet (25 mg total) by mouth daily. 90 tablet 1  . levothyroxine (SYNTHROID, LEVOTHROID) 125 MCG tablet Take 1 tablet (125 mcg total) by mouth daily. 90 tablet 1  . metFORMIN (GLUCOPHAGE XR) 500 MG 24 hr tablet Take 1 tablet (500 mg total) by mouth daily with breakfast. 180 tablet 1  . Multiple Vitamin (MULTIVITAMIN) capsule Take 1 capsule by mouth daily.    . timolol (TIMOPTIC) 0.5 % ophthalmic solution     . Zinc 50 MG TABS Take 50 mg by mouth daily.     No current facility-administered medications for this visit.   Allergies  Allergen Reactions  . Lipitor [Atorvastatin] Other (See Comments)    Myalgia  . Lisinopril  Throat swelling  . Simvastatin Other (See Comments)    Myalgias and joint pain.     Exam:  BP 165/96 mmHg  Pulse 76  Wt 212 lb (96.163 kg) General: Well Developed, well nourished, and in no acute distress.  Neuro/Psych: Alert and oriented x3, extra-ocular muscles intact, able to move all 4 extremities, sensation grossly intact. Skin: Warm and dry, no rashes noted.  Respiratory: Not using accessory muscles, speaking in full sentences, trachea midline.  Cardiovascular: Pulses palpable, no extremity edema. Abdomen: Does not appear distended. MSK: Knees bilaterally with mild effusion. Normal motion. Stable ligamentous exam  Procedure: Real-time Ultrasound Guided Injection of Right  Knee Device: GE Logiq E  Images permanently stored and available for review in the ultrasound unit. Verbal informed consent obtained. Discussed risks and benefits of procedure. Warned about infection bleeding damage to structures skin hypopigmentation and fat atrophy among others. Patient expresses understanding and agreement Time-out conducted.  Noted no overlying erythema, induration, or other signs of local infection.  Skin prepped in a sterile fashion.  Local anesthesia: Topical Ethyl chloride.  With sterile technique and under real time ultrasound guidance: 80 mg of Kenalog and 4 mL of Marcaine injected easily.  Completed without difficulty  Pain immediately resolved suggesting accurate placement of the medication.  Advised to call if fevers/chills, erythema, induration, drainage, or persistent bleeding.  Images permanently stored and available for review in the ultrasound unit.  Impression: Technically successful ultrasound guided injection.  Procedure: Real-time Ultrasound Guided Injection of Left knee.  Device: GE Logiq E  Images permanently stored and available for review in the ultrasound unit. Verbal informed consent obtained. Discussed risks and benefits of procedure. Warned about infection bleeding damage to structures skin hypopigmentation and fat atrophy among others. Patient expresses understanding and agreement Time-out conducted.  Noted no overlying erythema, induration, or other signs of local infection.  Skin prepped in a sterile fashion.  Local anesthesia: Topical Ethyl chloride.  With sterile technique and under real time ultrasound guidance: 80 mg of Kenalog and 4 mL of Marcaine injected easily.  Completed without difficulty  Pain immediately resolved suggesting accurate placement of the medication.  Advised to call if fevers/chills, erythema, induration, drainage, or persistent bleeding.  Images permanently stored and available for review in  the ultrasound unit.  Impression: Technically successful ultrasound guided injection.   No results found for this or any previous visit (from the past 24 hour(s)). No results found.   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 Steroid injections today. Follow-up with PCP soon. Return as needed if pain does not improve  2) foot pain resolved with injections. Due to DJD. Return as needed  3) Thigh pain due to statin myalgia likely. Much better with discontinuation of Lipitor. Follow-up with PCP soon. Patient may benefit from [facility]

## 2015-11-14 ENCOUNTER — Ambulatory Visit: Payer: Managed Care, Other (non HMO) | Admitting: Family Medicine

## 2015-11-22 ENCOUNTER — Ambulatory Visit (INDEPENDENT_AMBULATORY_CARE_PROVIDER_SITE_OTHER): Payer: Managed Care, Other (non HMO) | Admitting: Family Medicine

## 2015-11-22 ENCOUNTER — Encounter: Payer: Self-pay | Admitting: Family Medicine

## 2015-11-22 VITALS — BP 140/78 | HR 89 | Wt 216.0 lb

## 2015-11-22 DIAGNOSIS — R7301 Impaired fasting glucose: Secondary | ICD-10-CM | POA: Diagnosis not present

## 2015-11-22 DIAGNOSIS — E785 Hyperlipidemia, unspecified: Secondary | ICD-10-CM | POA: Diagnosis not present

## 2015-11-22 DIAGNOSIS — Z0000 Encounter for general adult medical examination without abnormal findings: Secondary | ICD-10-CM | POA: Diagnosis not present

## 2015-11-22 LAB — POCT GLYCOSYLATED HEMOGLOBIN (HGB A1C): Hemoglobin A1C: 6

## 2015-11-22 MED ORDER — PITAVASTATIN CALCIUM 2 MG PO TABS: 1.0000 | ORAL_TABLET | Freq: Every day | ORAL | Status: DC

## 2015-11-22 NOTE — Patient Instructions (Signed)
Keep up a regular exercise program and make sure you are eating a healthy diet Try to eat 4 servings of dairy a day, or if you are lactose intolerant take a calcium with vitamin D daily.  Your vaccines are up to date.   

## 2015-11-22 NOTE — Addendum Note (Signed)
Addended by: Teddy Spike on: 11/22/2015 09:44 AM   Modules accepted: Miquel Dunn

## 2015-11-22 NOTE — Progress Notes (Signed)
  Subjective:     [name] [name] is a 60 y.o. female and is here for a comprehensive physical exam. The patient reports no problems.She reports that she has been exercising some on her bike. She feels like she sleeping well. Denies any chest pain or shortness of breath or palpitations. Her mammogram is up-to-date. Colonoscopy is also up-to-date.  Social History   Social History  . Marital Status: Married    Spouse Name: divorced  . Number of Children: N/A  . Years of Education: N/A   Occupational History  .     Social History Main Topics  . Smoking status: Former Smoker    Quit date: 05/20/1994  . Smokeless tobacco: Never Used  . Alcohol Use: 1.8 oz/week    3 Glasses of wine per week     Comment: 2-3 glasses of wine on weekends  . Drug Use: No  . Sexual Activity: Not Currently   Other Topics Concern  . Not on file   Social History Narrative   Regular exercise, walking   Health Maintenance  Topic Date Due  . Hepatitis C Screening  03/30/2016 (Originally 06/11/1955)  . INFLUENZA VACCINE  12/19/2015  . MAMMOGRAM  04/19/2017  . TETANUS/TDAP  07/02/2017  . COLONOSCOPY  08/16/2019  . HIV Screening  Completed    The following portions of the patient's history were reviewed and updated as appropriate: allergies, current medications, past family history, past medical history, past social history, past surgical history and problem list.  Review of Systems A comprehensive review of systems was negative.   Objective:    BP 150/61 mmHg  Pulse 89  Wt 216 lb (97.977 kg)  SpO2 99% General appearance: alert, cooperative and appears stated age Head: Normocephalic, without obvious abnormality, atraumatic Eyes: conj clear, PEERLA, EOMi Ears: normal TM's and external ear canals both ears Nose: Nares normal. Septum midline. Mucosa normal. No drainage or sinus tenderness. Throat: lips, mucosa, and tongue normal; teeth and gums normal Neck: no adenopathy, no carotid bruit, no JVD,  supple, symmetrical, trachea midline and thyroid not enlarged, symmetric, no tenderness/mass/nodules Back: symmetric, no curvature. ROM normal. No CVA tenderness. Lungs: clear to auscultation bilaterally Breasts: normal appearance, no masses or tenderness Heart: regular rate and rhythm, S1, S2 normal, no murmur, click, rub or gallop Abdomen: soft, non-tender; bowel sounds normal; no masses,  no organomegaly Extremities: extremities normal, atraumatic, no cyanosis or edema Pulses: 2+ and symmetric Skin: Skin color, texture, turgor normal. No rashes or lesions Lymph nodes: Cervical, supraclavicular, and axillary nodes normal. Neurologic: Alert and oriented X 3, normal strength and tone. Normal symmetric reflexes. Normal coordination and gait    Assessment:    Healthy female exam.      Plan:     See After Visit Summary for Counseling Recommendations   Keep up a regular exercise program and make sure you are eating a healthy diet Try to eat 4 servings of dairy a day, or if you are lactose intolerant take a calcium with vitamin D daily.  Your vaccines are up to date.   Hyperlipidemia-she's feeling much better off the statin. She is willing to try one more. We'll start Livalo. Call if any palms or side effects. Can go for lipids in 1 months.   Impaired fasting glucose-11 A1c 6.0 today which is up from previous. Make sure working on diet and exercise and follow-up in 6 months.

## 2015-11-23 ENCOUNTER — Encounter: Payer: Self-pay | Admitting: Family Medicine

## 2015-12-01 ENCOUNTER — Telehealth: Payer: Self-pay | Admitting: *Deleted

## 2015-12-01 NOTE — Telephone Encounter (Signed)
PA submitted for Livalo KeyJohnanna Schneiders - PA Case ID: [id number]

## 2015-12-04 NOTE — Telephone Encounter (Signed)
Approvedon July 14  PA has been Approved  no phone # for patient Message left on pharm vm

## 2016-01-24 LAB — LIPID PANEL
CHOLESTEROL: 169 mg/dL (ref 125–200)
HDL: 65 mg/dL (ref >=46)
LDL Cholesterol: 73 mg/dL (ref <130)
TRIGLYCERIDES: 154 mg/dL — AB (ref <150)
Total CHOL/HDL Ratio: 2.6 Ratio (ref <=5.0)
VLDL: 31 mg/dL — ABNORMAL HIGH (ref <30)

## 2016-01-29 ENCOUNTER — Encounter: Payer: Self-pay | Admitting: Family Medicine

## 2016-02-05 ENCOUNTER — Encounter: Payer: Self-pay | Admitting: Family Medicine

## 2016-03-18 ENCOUNTER — Other Ambulatory Visit: Payer: Self-pay | Admitting: Family Medicine

## 2016-03-18 DIAGNOSIS — Z1231 Encounter for screening mammogram for malignant neoplasm of breast: Secondary | ICD-10-CM

## 2016-04-23 ENCOUNTER — Ambulatory Visit (INDEPENDENT_AMBULATORY_CARE_PROVIDER_SITE_OTHER): Payer: Managed Care, Other (non HMO)

## 2016-04-23 DIAGNOSIS — Z1231 Encounter for screening mammogram for malignant neoplasm of breast: Secondary | ICD-10-CM | POA: Diagnosis not present

## 2016-05-15 ENCOUNTER — Ambulatory Visit: Payer: Managed Care, Other (non HMO)

## 2016-05-27 ENCOUNTER — Ambulatory Visit (INDEPENDENT_AMBULATORY_CARE_PROVIDER_SITE_OTHER): Payer: Managed Care, Other (non HMO) | Admitting: Family Medicine

## 2016-05-27 ENCOUNTER — Encounter: Payer: Self-pay | Admitting: Family Medicine

## 2016-05-27 VITALS — BP 122/74 | HR 79 | Ht 69.0 in | Wt 216.0 lb

## 2016-05-27 DIAGNOSIS — E6609 Other obesity due to excess calories: Secondary | ICD-10-CM

## 2016-05-27 DIAGNOSIS — R030 Elevated blood-pressure reading, without diagnosis of hypertension: Secondary | ICD-10-CM

## 2016-05-27 DIAGNOSIS — R7301 Impaired fasting glucose: Secondary | ICD-10-CM

## 2016-05-27 DIAGNOSIS — Z6830 Body mass index (BMI) 30.0-30.9, adult: Secondary | ICD-10-CM | POA: Diagnosis not present

## 2016-05-27 LAB — POCT GLYCOSYLATED HEMOGLOBIN (HGB A1C): HEMOGLOBIN A1C: 5.6

## 2016-05-27 MED ORDER — LEVOTHYROXINE SODIUM 125 MCG PO TABS: 125.0000 ug | ORAL_TABLET | Freq: Every day | ORAL | 1 refills | Status: DC

## 2016-05-27 MED ORDER — HYDROCHLOROTHIAZIDE 25 MG PO TABS: 25.0000 mg | ORAL_TABLET | Freq: Every day | ORAL | 1 refills | Status: DC

## 2016-05-27 MED ORDER — METFORMIN HCL ER 500 MG PO TB24: 500.0000 mg | ORAL_TABLET | Freq: Every day | ORAL | 1 refills | Status: DC

## 2016-05-27 NOTE — Progress Notes (Signed)
Subjective:    CC: HTN  HPI: Hypertension- Pt denies chest pain, SOB, dizziness, or heart palpitations.  Taking meds as directed w/o problems.  Denies medication side effects.  Her blood pressure is 116/80 [facility]    IFG - no inc thrist or urination.   Obesity/BMI 31-she plans on getting back on track with diet and exercise. She wants to use the over-the-counter medication called Alli and start using her elliptical again. The one she had before broke but she just got a new one yesterday.  Past medical history, Surgical history, Family history not pertinant except as noted below, Social history, Allergies, and medications have been entered into the medical record, reviewed, and corrections made.   Review of Systems: No fevers, chills, night sweats, weight loss, chest pain, or shortness of breath.   Objective:    General: Well Developed, well nourished, and in no acute distress.  Neuro: Alert and oriented x3, extra-ocular muscles intact, sensation grossly intact.  HEENT: Normocephalic, atraumatic  Skin: Warm and dry, no rashes. Cardiac: Regular rate and rhythm, no murmurs rubs or gallops, no lower extremity edema.  Respiratory: Clear to auscultation bilaterally. Not using accessory muscles, speaking in full sentences.   Impression and Recommendations:   HTN - Blood pressures at home are well controlled. Continue to monitor.  IFG - Uncontrolled. Hemoglobin A1c of 5.9 today. Continue to monitor.  Obesity/BMI 31-  She plans on starting to exercise again. She likes using the elliptical. Encouraged her to use my fitness pal as well to help track calories.  Ok to use Alli OTC.

## 2016-05-28 ENCOUNTER — Encounter: Payer: Self-pay | Admitting: Family Medicine

## 2016-10-09 ENCOUNTER — Encounter: Payer: Self-pay | Admitting: Family Medicine

## 2016-10-09 ENCOUNTER — Ambulatory Visit (INDEPENDENT_AMBULATORY_CARE_PROVIDER_SITE_OTHER): Payer: Managed Care, Other (non HMO) | Admitting: Family Medicine

## 2016-10-09 VITALS — BP 162/73 | HR 90 | Wt 216.0 lb

## 2016-10-09 DIAGNOSIS — M25561 Pain in right knee: Secondary | ICD-10-CM

## 2016-10-09 DIAGNOSIS — M25562 Pain in left knee: Secondary | ICD-10-CM | POA: Diagnosis not present

## 2016-10-09 DIAGNOSIS — G8929 Other chronic pain: Secondary | ICD-10-CM | POA: Diagnosis not present

## 2016-10-09 NOTE — Progress Notes (Signed)
[name] [name] is a 61 y.o. female who presents to [facility] today for bilateral knee pain. Patient was seen about 11 months ago for knee pain. This was thought to be due to DJD. She received a steroid injection which worked for about a month. In the interim she's noted continued knee pain has slowly been worsening. She's been treating it with over-the-counter oral NSAIDs as well as exercises. She's been using an exercise bike or elliptical machine which does help. She denies any fevers or chills. She has daily bilateral knee pain worse with activity and better with rest. Pain is interfering with her ability to exercise and walk normally at times.   Past Medical History:  Diagnosis Date  . Hyperlipidemia   . Hypothyroidism   . Migraine    hx of  . Migraines   . Obesity   . Obesity    Past Surgical History:  Procedure Laterality Date  . ABDOMINAL HYSTERECTOMY  2005   complete  . CATARACT EXTRACTION W/ INTRAOCULAR LENS IMPLANT Left 05/10/14  . dexa  4/07   (+) 1.7  . JOINT REPLACEMENT     L hip replacement 2011  . migraines, resolved  2003  . Right foot surgery  2000   callus removed   Social History  Substance Use Topics  . Smoking status: Former Smoker    Quit date: 05/20/1994  . Smokeless tobacco: Never Used  . Alcohol use 1.8 oz/week    3 Glasses of wine per week     Comment: 2-3 glasses of wine on weekends     ROS:  As above   Medications: Current Outpatient Prescriptions  Medication Sig Dispense Refill  . Ascorbic Acid (VITAMIN C) 1000 MG tablet Take 1 tablet by mouth daily.    . Calcium Carbonate (CALCIUM 600 PO) Take by mouth.    . Cholecalciferol (D-3-5) 5000 UNITS capsule Take 5,000 Units by mouth daily.    . Cyanocobalamin (B-12) 2500 MCG TABS Take 1 tablet by mouth daily.    . fish oil-omega-3 fatty acids 1000 MG capsule Take 1 g by mouth daily.     . hydrochlorothiazide (HYDRODIURIL) 25 MG tablet Take 1  tablet (25 mg total) by mouth daily. 90 tablet 1  . levothyroxine (SYNTHROID, LEVOTHROID) 125 MCG tablet Take 1 tablet (125 mcg total) by mouth daily. 90 tablet 1  . metFORMIN (GLUCOPHAGE XR) 500 MG 24 hr tablet Take 1 tablet (500 mg total) by mouth daily with breakfast. 180 tablet 1  . Multiple Vitamin (MULTIVITAMIN) capsule Take 1 capsule by mouth daily.    . timolol (TIMOPTIC) 0.5 % ophthalmic solution     . vitamin E 1000 UNIT capsule Take 1,000 Units by mouth daily.    . Zinc 50 MG TABS Take 50 mg by mouth daily.     No current facility-administered medications for this visit.    Allergies  Allergen Reactions  . Lipitor [Atorvastatin] Other (See Comments)    Myalgia  . Lisinopril     Throat swelling  . Simvastatin Other (See Comments)    Myalgias and joint pain.     Exam:  BP (!) 162/73   Pulse 90   Wt 216 lb (98 kg)   BMI 31.90 kg/m  General: Well Developed, well nourished, and in no acute distress.  Neuro/Psych: Alert and oriented x3, extra-ocular muscles intact, able to move all 4 extremities, sensation grossly intact. Skin: Warm and dry, no rashes noted.  Respiratory: Not  using accessory muscles, speaking in full sentences, trachea midline.  Cardiovascular: Pulses palpable, no extremity edema. Abdomen: Does not appear distended. MSK: Knee pain bilaterally shows no significant effusion. Range of motion 0-120 with retropatellar crepitations. Diffusely tender. Stable ligamentous exam. Intact strength.  Procedure: Real-time Ultrasound Guided Injection of right knee  Device: GE Logiq E  Images permanently stored and available for review in the ultrasound unit. Verbal informed consent obtained. Discussed risks and benefits of procedure. Warned about infection bleeding damage to structures skin hypopigmentation and fat atrophy among others. Patient expresses understanding and agreement Time-out conducted.  Noted no overlying erythema, induration, or other signs of  local infection.  Skin prepped in a sterile fashion.  Local anesthesia: Topical Ethyl chloride.  With sterile technique and under real time ultrasound guidance: 80mg  kenalog and 82ml marcaine injected easily.  Completed without difficulty  Pain immediately resolved suggesting accurate placement of the medication.  Advised to call if fevers/chills, erythema, induration, drainage, or persistent bleeding.  Images permanently stored and available for review in the ultrasound unit.  Impression: Technically successful ultrasound guided injection.   Procedure: Real-time Ultrasound Guided Injection of left knee  Device: GE Logiq E  Images permanently stored and available for review in the ultrasound unit. Verbal informed consent obtained. Discussed risks and benefits of procedure. Warned about infection bleeding damage to structures skin hypopigmentation and fat atrophy among others. Patient expresses understanding and agreement Time-out conducted.  Noted no overlying erythema, induration, or other signs of local infection.  Skin prepped in a sterile fashion.  Local anesthesia: Topical Ethyl chloride.  With sterile technique and under real time ultrasound guidance: 80mg  kenalog and 65ml marcaine injected easily.  Completed without difficulty  Pain immediately resolved suggesting accurate placement of the medication.  Advised to call if fevers/chills, erythema, induration, drainage, or persistent bleeding.  Images permanently stored and available for review in the ultrasound unit.  Impression: Technically successful ultrasound guided injection.     No results found for this or any previous visit (from the past 48 hour(s)). No results found.    Assessment and Plan: 61 y.o. female with BL knee pain due to DJD. Plan for repeat injection today along with continued quad strengthening exercises and weight loss. If steroid injections or not effective will proceed with trial of  viscous supplementation. We'll start the authorization process today.    No orders of the defined types were placed in this encounter.  No orders of the defined types were placed in this encounter.   Discussed warning signs or symptoms. Please see discharge instructions. Patient expresses understanding.

## 2016-10-09 NOTE — Patient Instructions (Signed)
Thank you for coming in today. Call or go to the ER if you develop a large red swollen joint with extreme pain or oozing puss.  Let me know how you do .  We will authorize the gel shots.   Keep using the exercise bike this will help both quadriceps strength and keep the weight off. This is best evidence for knee arthritis pain control.    Sodium Hyaluronate intra-articular injection What is this medicine? SODIUM HYALURONATE (SOE dee um hye al yoor ON ate) is used to treat pain in the knee due to osteoarthritis. This medicine may be used for other purposes; ask your health care provider or pharmacist if you have questions. COMMON BRAND NAME(S): Amvisc, DUROLANE, Euflexxa, GELSYN-3, Hyalgan, Monovisc, Orthovisc, Supartz, Supartz FX What should I tell my health care provider before I take this medicine? They need to know if you have any of these conditions: -bleeding disorders -glaucoma -infection in the knee joint -skin conditions or sensitivity -skin infection -an unusual allergic reaction to sodium hyaluronate, other medicines, foods, dyes, or preservatives. Different brands of sodium hyaluronate contain different allergens. Some may contain egg. Talk to your doctor about your allergies to make sure that you get the right product. -pregnant or trying to get pregnant -breast-feeding How should I use this medicine? This medicine is for injection into the knee joint. It is given by a health care professional in a hospital or clinic setting. Talk to your pediatrician regarding the use of this medicine in children. Special care may be needed. Overdosage: If you think you have taken too much of this medicine contact a poison control center or emergency room at once. NOTE: This medicine is only for you. Do not share this medicine with others. What if I miss a dose? This does not apply. What may interact with this medicine? Interactions are not expected. This list may not describe all  possible interactions. Give your health care provider a list of all the medicines, herbs, non-prescription drugs, or dietary supplements you use. Also tell them if you smoke, drink alcohol, or use illegal drugs. Some items may interact with your medicine. What should I watch for while using this medicine? Tell your doctor or healthcare professional if your symptoms do not start to get better or if they get worse. If receiving this medicine for osteoarthritis, limit your activity after you receive your injection. Avoid physical activity for 48 hours following your injection to keep your knee from swelling. Do not stand on your feet for more than 1 hour at a time during the first 48 hours following your injection. Ask your doctor or healthcare professional about when you can begin major physical activity again. What side effects may I notice from receiving this medicine? Side effects that you should report to your doctor or health care professional as soon as possible: -allergic reactions like skin rash, itching or hives, swelling of the face, lips, or tongue -dizziness -facial flushing -pain, tingling, numbness in the hands or feet -vision changes if received this medicine during eye surgery Side effects that usually do not require medical attention (report to your doctor or health care professional if they continue or are bothersome): -back pain -bruising at site where injected -chills -diarrhea -fever -headache -joint pain -joint stiffness -joint swelling -muscle cramps -muscle pain -nausea, vomiting -pain, redness, or irritation at site where injected -weak or tired This list may not describe all possible side effects. Call your doctor for medical advice about side effects. You  may report side effects to FDA at 1-800-FDA-1088. Where should I keep my medicine? This drug is given in a hospital or clinic and will not be stored at home. NOTE: This sheet is a summary. It may not cover all  possible information. If you have questions about this medicine, talk to your doctor, pharmacist, or health care provider.  2018 Elsevier/Gold Standard (2015-06-08 08:34:51)

## 2016-10-10 ENCOUNTER — Telehealth: Payer: Self-pay | Admitting: Family Medicine

## 2016-10-10 NOTE — Telephone Encounter (Signed)
-----   Message from Gregor Hams, MD sent at 10/09/2016  1:39 PM EDT ----- Please authorize orthovisc

## 2016-10-10 NOTE — Telephone Encounter (Signed)
Submitted for approval on Orthovisc. Awaiting confirmation.  

## 2016-11-13 NOTE — Telephone Encounter (Signed)
Received the following information from OV benefits investigation:   Orthovisc is covered. Buy and [employer] is allowed. After the deductible has been met, insurance will cover 80% of the allowable amount. The patient has a 20% coinsurance responsibility. Prior Authorization is required. The pharmacy option is available through [facility], [phone]. Call reference number is [id number]. Office Action Steps: Please call [phone] to initiate the PA for Orthovisc. Please also note the plan is set to terminate on 10/18/16.  Pt is past termination date. Left VM to see if Pt has new insurance information.

## 2016-11-14 ENCOUNTER — Encounter: Payer: Self-pay | Admitting: Family Medicine

## 2016-11-14 NOTE — Telephone Encounter (Signed)
Pt resent new insurance card (see MyChart message). Will resubmit.

## 2016-11-19 ENCOUNTER — Telehealth: Payer: Self-pay | Admitting: *Deleted

## 2016-11-19 NOTE — Telephone Encounter (Signed)
Orthovisc is covered. Buy and [employer] is allowed. After the deductible has been met, insurance will cover 80% of the allowable amount. The patient has a 20% coinsurance responsibility. Prior Authorization is required. The pharmacy option is available through [facility], [phone]. Call refererence number is [id number]. Pt advised that her total out of pocket for bilateral injections would be $4931.10. Pt would like to hold off on injections for now.

## 2016-11-25 ENCOUNTER — Encounter: Payer: Self-pay | Admitting: Family Medicine

## 2016-11-25 ENCOUNTER — Ambulatory Visit (INDEPENDENT_AMBULATORY_CARE_PROVIDER_SITE_OTHER): Payer: Managed Care, Other (non HMO) | Admitting: Family Medicine

## 2016-11-25 VITALS — BP 136/80 | HR 81 | Wt 218.0 lb

## 2016-11-25 DIAGNOSIS — R030 Elevated blood-pressure reading, without diagnosis of hypertension: Secondary | ICD-10-CM | POA: Diagnosis not present

## 2016-11-25 DIAGNOSIS — E039 Hypothyroidism, unspecified: Secondary | ICD-10-CM

## 2016-11-25 DIAGNOSIS — Z6832 Body mass index (BMI) 32.0-32.9, adult: Secondary | ICD-10-CM | POA: Diagnosis not present

## 2016-11-25 DIAGNOSIS — R7301 Impaired fasting glucose: Secondary | ICD-10-CM | POA: Diagnosis not present

## 2016-11-25 DIAGNOSIS — E785 Hyperlipidemia, unspecified: Secondary | ICD-10-CM

## 2016-11-25 LAB — POCT GLYCOSYLATED HEMOGLOBIN (HGB A1C): Hemoglobin A1C: 6

## 2016-11-25 MED ORDER — HYDROCHLOROTHIAZIDE 25 MG PO TABS: 25.0000 mg | ORAL_TABLET | Freq: Every day | ORAL | 1 refills | Status: DC

## 2016-11-25 MED ORDER — METFORMIN HCL ER 500 MG PO TB24: 500.0000 mg | ORAL_TABLET | Freq: Every day | ORAL | 1 refills | Status: DC

## 2016-11-25 MED ORDER — LEVOTHYROXINE SODIUM 125 MCG PO TABS: 125.0000 ug | ORAL_TABLET | Freq: Every day | ORAL | 1 refills | Status: DC

## 2016-11-25 NOTE — Progress Notes (Signed)
Subjective:    CC: BP, glucose, thyroid  HPI: Elevated BP  Pt denies chest pain, SOB, dizziness, or heart palpitations.     Impaired fasting glucose-no increased thirst or urination. No symptoms consistent with hypoglycemia.  Hypothyroidism-no recent changes. She is having some difficulty losing weight. She's actually been working out and still managed to gain 2 pounds. She feels like she has a pretty good handle on the daily caloric intake. She wonders if she should go back on weight loss medication again.  Past medical history, Surgical history, Family history not pertinant except as noted below, Social history, Allergies, and medications have been entered into the medical record, reviewed, and corrections made.   Review of Systems: No fevers, chills, night sweats, weight loss, chest pain, or shortness of breath.   Objective:    General: Well Developed, well nourished, and in no acute distress.  Neuro: Alert and oriented x3, extra-ocular muscles intact, sensation grossly intact.  HEENT: Normocephalic, atraumatic  Skin: Warm and dry, no rashes. Cardiac: Regular rate and rhythm, no murmurs rubs or gallops, no lower extremity edema.  Respiratory: Clear to auscultation bilaterally. Not using accessory muscles, speaking in full sentences.   Impression and Recommendations:   Elevated blood pressure - continue to take HCTZ daily. Due to recheck potassium and kidney  IFG  - Well controlled. Though A1c is up a little bit from last year. Just make sure continuing to work on healthy diet and regular exercise. Continue current regimen. Follow up in  6 months.   Lab Results  Component Value Date   HGBA1C 6.0 11/25/2016    Hypothyroidism-due to recheck TSH. Will call with results once available.  BMI 32-we discussed options. I think she would actually really be a great candidate for bariatric program which would be more comprehensive for her. I think in the long run she would actually be more  successful and have the support that she needs to be able to stick with this plan. She has used my fitness pal in the past and was actually on phentermine back in 2015. Encouraged her to think about referral for bariatric program and let me know if she thinks she is interested. F/U in 6 months if not sooner.    Discussed Shingrix, H.O given.

## 2016-11-27 LAB — LIPID PANEL W/REFLEX DIRECT LDL
Cholesterol: 248 mg/dL — ABNORMAL HIGH (ref <200)
HDL: 72 mg/dL (ref >50)
LDL-CHOLESTEROL: 151 mg/dL — AB
NON-HDL CHOLESTEROL (CALC): 176 mg/dL — AB (ref <130)
TRIGLYCERIDES: 126 mg/dL (ref <150)
Total CHOL/HDL Ratio: 3.4 Ratio (ref <5.0)

## 2016-11-27 LAB — COMPLETE METABOLIC PANEL WITH GFR
ALT: 26 U/L (ref 6–29)
AST: 19 U/L (ref 10–35)
Albumin: 4.3 g/dL (ref 3.6–5.1)
Alkaline Phosphatase: 75 U/L (ref 33–130)
BILIRUBIN TOTAL: 0.5 mg/dL (ref 0.2–1.2)
BUN: 20 mg/dL (ref 7–25)
CHLORIDE: 103 mmol/L (ref 98–110)
CO2: 25 mmol/L (ref 20–31)
CREATININE: 0.6 mg/dL (ref 0.50–0.99)
Calcium: 9.8 mg/dL (ref 8.6–10.4)
GLUCOSE: 96 mg/dL (ref 65–99)
Potassium: 4.6 mmol/L (ref 3.5–5.3)
Sodium: 139 mmol/L (ref 135–146)
TOTAL PROTEIN: 6.6 g/dL (ref 6.1–8.1)

## 2016-11-27 LAB — TSH: TSH: 1.3 m[IU]/L

## 2016-11-28 MED ORDER — PITAVASTATIN CALCIUM 2 MG PO TABS: 2.0000 mg | ORAL_TABLET | Freq: Every day | ORAL | 3 refills | Status: DC

## 2016-11-28 NOTE — Addendum Note (Signed)
Addended by: Beatrice Lecher D on: 11/28/2016 10:28 PM   Modules accepted: Orders

## 2016-12-02 ENCOUNTER — Telehealth: Payer: Self-pay

## 2016-12-02 NOTE — Telephone Encounter (Signed)
PA approved for Pitavastatin Calcium approved from 12/02/16-12/02/17. Auth # [id number].

## 2017-02-05 ENCOUNTER — Encounter: Payer: Self-pay | Admitting: Family Medicine

## 2017-02-07 ENCOUNTER — Encounter: Payer: Self-pay | Admitting: Family Medicine

## 2017-02-09 NOTE — Telephone Encounter (Signed)
I would definitely stop the livalo especially if causes a rash.  Thank you for trying it!    I will add it to your intolerance list.  You can try the cherry juice if you would like.  It just doesn't have the same data to reduce the risk of stroke and heart attack so just keep that in mind.    Dr. Jerilynn Mages

## 2017-02-24 ENCOUNTER — Encounter: Payer: Self-pay | Admitting: Family Medicine

## 2017-02-24 ENCOUNTER — Ambulatory Visit (INDEPENDENT_AMBULATORY_CARE_PROVIDER_SITE_OTHER): Payer: 59 | Admitting: Family Medicine

## 2017-02-24 VITALS — BP 162/82 | HR 82 | Ht 69.0 in | Wt 214.0 lb

## 2017-02-24 DIAGNOSIS — G8929 Other chronic pain: Secondary | ICD-10-CM | POA: Diagnosis not present

## 2017-02-24 DIAGNOSIS — I10 Essential (primary) hypertension: Secondary | ICD-10-CM | POA: Diagnosis not present

## 2017-02-24 DIAGNOSIS — M25562 Pain in left knee: Secondary | ICD-10-CM

## 2017-02-24 DIAGNOSIS — M25561 Pain in right knee: Secondary | ICD-10-CM

## 2017-02-24 DIAGNOSIS — E782 Mixed hyperlipidemia: Secondary | ICD-10-CM

## 2017-02-24 NOTE — Patient Instructions (Addendum)
Thank you for coming in today. Call or go to the ER if you develop a large red swollen joint with extreme pain or oozing puss.  Let me know if anything comes up.  Recheck with me as needed.   If your cholesterol cannot get better on Apple Cider Vinegar and Fish Oil we may want consider repatha.   Evolocumab injection What is this medicine? EVOLOCUMAB (e voe LOK ue mab) is known as a PCSK9 inhibitor. It is used to lower the level of cholesterol in the blood. It may be used alone or in combination with other cholesterol-lowering drugs. This drug may also be used to reduce the risk of heart attack, stroke, and certain types of heart surgery in patients with heart disease. This medicine may be used for other purposes; ask your health care provider or pharmacist if you have questions. COMMON BRAND NAME(S): REPATHA What should I tell my health care provider before I take this medicine? They need to know if you have any of these conditions: -an unusual or allergic reaction to evolocumab, other medicines, foods, dyes, or preservatives -pregnant or trying to get pregnant -breast-feeding How should I use this medicine? This medicine is for injection under the skin. You will be taught how to prepare and give this medicine. Use exactly as directed. Take your medicine at regular intervals. Do not take your medicine more often than directed. It is important that you put your used needles and syringes in a special sharps container. Do not put them in a trash can. If you do not have a sharps container, call your pharmacist or health care provider to get one. Talk to your pediatrician regarding the use of this medicine in children. While this drug may be prescribed for children as young as 13 years for selected conditions, precautions do apply. Overdosage: If you think you have taken too much of this medicine contact a poison control center or emergency room at once. NOTE: This medicine is only for you. Do not  share this medicine with others. What if I miss a dose? If you miss a dose, take it as soon as you can if there are more than 7 days until the next scheduled dose, or skip the missed dose and take the next dose according to your original schedule. Do not take double or extra doses. What may interact with this medicine? Interactions are not expected. This list may not describe all possible interactions. Give your health care provider a list of all the medicines, herbs, non-prescription drugs, or dietary supplements you use. Also tell them if you smoke, drink alcohol, or use illegal drugs. Some items may interact with your medicine. What should I watch for while using this medicine? You may need blood work while you are taking this medicine. What side effects may I notice from receiving this medicine? Side effects that you should report to your doctor or health care professional as soon as possible: -allergic reactions like skin rash, itching or hives, swelling of the face, lips, or tongue -signs and symptoms of infection like fever or chills; cough; sore throat; pain or trouble passing urine Side effects that usually do not require medical attention (report to your doctor or health care professional if they continue or are bothersome): -diarrhea -nausea -muscle pain -pain, redness, or irritation at site where injected This list may not describe all possible side effects. Call your doctor for medical advice about side effects. You may report side effects to FDA at 1-800-FDA-1088. Where  should I keep my medicine? Keep out of the reach of children. You will be instructed on how to store this medicine. Throw away any unused medicine after the expiration date on the label. NOTE: This sheet is a summary. It may not cover all possible information. If you have questions about this medicine, talk to your doctor, pharmacist, or health care provider.  2018 Elsevier/Gold Standard (2016-04-22  13:21:53)

## 2017-02-24 NOTE — Progress Notes (Signed)
[name] [name] is a 61 y.o. female who presents to [facility] today for follow-up knee pain. 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 She received several steroid knee injections which worked quite well. Her last injection was in May. She's done pretty well but notes returning pain. She's planning on traveling to [place] in a few days to attend a wedding and notes that she'll be doing a lot of walking. She would like a steroid injection if possible before then.  Hyperlipidemia: Patient has had trouble tolerating statins previously. Her primary care provider tried Livalo which she cannot tolerate either. It causes itching. She's taking Fish oil and apple cider vinegar. She plans on having her lipids rechecked in December.  Past Medical History:  Diagnosis Date  . Hyperlipidemia   . Hypothyroidism   . Migraine    hx of  . Migraines   . Obesity   . Obesity    Past Surgical History:  Procedure Laterality Date  . ABDOMINAL HYSTERECTOMY  2005   complete  . CATARACT EXTRACTION W/ INTRAOCULAR LENS IMPLANT Left 05/10/14  . dexa  4/07   (+) 1.7  . JOINT REPLACEMENT     L hip replacement 2011  . migraines, resolved  2003  . Right foot surgery  2000   callus removed   Social History  Substance Use Topics  . Smoking status: Former Smoker    Quit date: 05/20/1994  . Smokeless tobacco: Never Used  . Alcohol use 1.8 oz/week    3 Glasses of wine per week     Comment: 2-3 glasses of wine on weekends     ROS:  As above   Medications: Current Outpatient Prescriptions  Medication Sig Dispense Refill  . Ascorbic Acid (VITAMIN C) 1000 MG tablet Take 1 tablet by mouth daily.    . Calcium Carbonate (CALCIUM 600 PO) Take by mouth.    . Cholecalciferol (D-3-5) 5000 UNITS capsule Take 5,000 Units by mouth daily.    . Cyanocobalamin (B-12) 2500 MCG TABS Take 1 tablet by mouth daily.    . fish oil-omega-3 fatty acids 1000  MG capsule Take 1 g by mouth daily.     . hydrochlorothiazide (HYDRODIURIL) 25 MG tablet Take 1 tablet (25 mg total) by mouth daily. 90 tablet 1  . levothyroxine (SYNTHROID, LEVOTHROID) 125 MCG tablet Take 1 tablet (125 mcg total) by mouth daily. 90 tablet 1  . metFORMIN (GLUCOPHAGE XR) 500 MG 24 hr tablet Take 1 tablet (500 mg total) by mouth daily with breakfast. 180 tablet 1  . Multiple Vitamin (MULTIVITAMIN) capsule Take 1 capsule by mouth daily.    . Pitavastatin Calcium 2 MG TABS Take 1 tablet (2 mg total) by mouth at bedtime. 90 tablet 3  . timolol (TIMOPTIC) 0.5 % ophthalmic solution     . vitamin E 1000 UNIT capsule Take 1,000 Units by mouth daily.    . Zinc 50 MG TABS Take 50 mg by mouth daily.     No current facility-administered medications for this visit.    Allergies  Allergen Reactions  . Lipitor [Atorvastatin] Other (See Comments)    Myalgia  . Lisinopril     Throat swelling  . Simvastatin Other (See Comments)    Myalgias and joint pain.  Chanda Busing [Pitavastatin] Rash    Rash on face, itchy skin, joint stiffness     Exam:  BP (!) 157/67   Pulse 93   Ht  5\' 9"  (1.753 m)   Wt 214 lb (97.1 kg)   BMI 31.60 kg/m  General: Well Developed, well nourished, and in no acute distress.  Neuro/Psych: Alert and oriented x3, extra-ocular muscles intact, able to move all 4 extremities, sensation grossly intact. Skin: Warm and dry, no rashes noted.  Respiratory: Not using accessory muscles, speaking in full sentences, trachea midline.  Cardiovascular: Pulses palpable, no extremity edema. Abdomen: Does not appear distended. MSK: Knees bilaterally are free of erythema. Mild effusion is present in the left but not on the right. Range of motion is normal as is ligamentous exam testing.  Procedure: Real-time Ultrasound Guided Injection of right knee  Device: GE Logiq E  Images permanently stored and available for review in the ultrasound unit. Verbal informed consent obtained.  Discussed risks and benefits of procedure. Warned about infection bleeding damage to structures skin hypopigmentation and fat atrophy among others. Patient expresses understanding and agreement Time-out conducted.  Noted no overlying erythema, induration, or other signs of local infection.  Skin prepped in a sterile fashion.  Local anesthesia: Topical Ethyl chloride.  With sterile technique and under real time ultrasound guidance: 80mg  kenalog and 52ml marcaine injected easily.  Completed without difficulty  Pain immediately resolved suggesting accurate placement of the medication.  Advised to call if fevers/chills, erythema, induration, drainage, or persistent bleeding.  Images permanently stored and available for review in the ultrasound unit.  Impression: Technically successful ultrasound guided injection.   Procedure: Real-time Ultrasound Guided Injection of left knee  Device: GE Logiq E  Images permanently stored and available for review in the ultrasound unit. Verbal informed consent obtained. Discussed risks and benefits of procedure. Warned about infection bleeding damage to structures skin hypopigmentation and fat atrophy among others. Patient expresses understanding and agreement Time-out conducted.  Noted no overlying erythema, induration, or other signs of local infection.  Skin prepped in a sterile fashion.  Local anesthesia: Topical Ethyl chloride.  With sterile technique and under real time ultrasound guidance: 80mg  kenalog and 43ml marcaine injected easily.  Completed without difficulty  Pain immediately resolved suggesting accurate placement of the medication.  Advised to call if fevers/chills, erythema, induration, drainage, or persistent bleeding.  Images permanently stored and available for review in the ultrasound unit.  Impression: Technically successful ultrasound guided injection.  No results found for this or any previous visit (from the  past 48 hour(s)). No results found.    Assessment and Plan: 61 y.o. female with knee pain bilaterally due to DJD. Steroid injection today. Discussed alternative treatments. Additionally discussed her other chronic medical conditions including hypertension and hyperlipidemia.   Hyperlipidemia: As noted above. Plan to recheck lipids in December with PCP. We discussed Repatha briefly.  Hypertension: Blood pressure elevated today initially and it rechecked. Recommend follow-up with PCP to further adjust antihypertension medications as needed.   No orders of the defined types were placed in this encounter.  No orders of the defined types were placed in this encounter.   Discussed warning signs or symptoms. Please see discharge instructions. Patient expresses understanding.

## 2017-03-11 ENCOUNTER — Other Ambulatory Visit: Payer: Self-pay | Admitting: Family Medicine

## 2017-03-11 DIAGNOSIS — Z1231 Encounter for screening mammogram for malignant neoplasm of breast: Secondary | ICD-10-CM

## 2017-04-17 ENCOUNTER — Telehealth: Payer: Self-pay

## 2017-04-17 DIAGNOSIS — E782 Mixed hyperlipidemia: Secondary | ICD-10-CM

## 2017-04-17 DIAGNOSIS — R739 Hyperglycemia, unspecified: Secondary | ICD-10-CM

## 2017-04-17 NOTE — Telephone Encounter (Signed)
Labs ordered.

## 2017-04-30 ENCOUNTER — Ambulatory Visit (INDEPENDENT_AMBULATORY_CARE_PROVIDER_SITE_OTHER): Payer: 59

## 2017-04-30 DIAGNOSIS — Z1231 Encounter for screening mammogram for malignant neoplasm of breast: Secondary | ICD-10-CM

## 2017-05-01 LAB — COMPLETE METABOLIC PANEL WITH GFR
AG RATIO: 1.7 (calc) (ref 1.0–2.5)
ALBUMIN MSPROF: 4.1 g/dL (ref 3.6–5.1)
ALT: 18 U/L (ref 6–29)
AST: 17 U/L (ref 10–35)
Alkaline phosphatase (APISO): 70 U/L (ref 33–130)
BILIRUBIN TOTAL: 0.5 mg/dL (ref 0.2–1.2)
BUN: 14 mg/dL (ref 7–25)
CO2: 29 mmol/L (ref 20–32)
Calcium: 9.8 mg/dL (ref 8.6–10.4)
Chloride: 101 mmol/L (ref 98–110)
Creat: 0.6 mg/dL (ref 0.50–0.99)
GFR, Est African American: 114 mL/min/{1.73_m2} (ref >=60)
GFR, Est Non African American: 98 mL/min/{1.73_m2} (ref >=60)
GLOBULIN: 2.4 g/dL (ref 1.9–3.7)
Glucose, Bld: 107 mg/dL — ABNORMAL HIGH (ref 65–99)
Potassium: 4.5 mmol/L (ref 3.5–5.3)
SODIUM: 137 mmol/L (ref 135–146)
TOTAL PROTEIN: 6.5 g/dL (ref 6.1–8.1)

## 2017-05-01 LAB — HEMOGLOBIN A1C
Hgb A1c MFr Bld: 5.9 % of total Hgb — ABNORMAL HIGH (ref <5.7)
Mean Plasma Glucose: 123 (calc)
eAG (mmol/L): 6.8 (calc)

## 2017-05-01 LAB — LIPID PANEL W/REFLEX DIRECT LDL
CHOLESTEROL: 237 mg/dL — AB (ref <200)
HDL: 71 mg/dL (ref >50)
LDL Cholesterol (Calc): 139 mg/dL (calc) — ABNORMAL HIGH
Non-HDL Cholesterol (Calc): 166 mg/dL (calc) — ABNORMAL HIGH (ref <130)
TRIGLYCERIDES: 144 mg/dL (ref <150)
Total CHOL/HDL Ratio: 3.3 (calc) (ref <5.0)

## 2017-05-07 ENCOUNTER — Ambulatory Visit: Payer: 59

## 2017-05-26 ENCOUNTER — Ambulatory Visit: Payer: Managed Care, Other (non HMO) | Admitting: Family Medicine

## 2017-05-27 ENCOUNTER — Ambulatory Visit (INDEPENDENT_AMBULATORY_CARE_PROVIDER_SITE_OTHER): Payer: 59 | Admitting: Family Medicine

## 2017-05-27 ENCOUNTER — Encounter: Payer: Self-pay | Admitting: Family Medicine

## 2017-05-27 VITALS — BP 138/70 | HR 88 | Ht 69.0 in | Wt 216.0 lb

## 2017-05-27 VITALS — BP 156/82 | HR 88 | Ht 69.0 in | Wt 216.0 lb

## 2017-05-27 DIAGNOSIS — H04203 Unspecified epiphora, bilateral lacrimal glands: Secondary | ICD-10-CM | POA: Diagnosis not present

## 2017-05-27 DIAGNOSIS — M25562 Pain in left knee: Secondary | ICD-10-CM

## 2017-05-27 DIAGNOSIS — G8929 Other chronic pain: Secondary | ICD-10-CM | POA: Diagnosis not present

## 2017-05-27 DIAGNOSIS — I10 Essential (primary) hypertension: Secondary | ICD-10-CM

## 2017-05-27 DIAGNOSIS — R7301 Impaired fasting glucose: Secondary | ICD-10-CM | POA: Diagnosis not present

## 2017-05-27 DIAGNOSIS — M25561 Pain in right knee: Secondary | ICD-10-CM

## 2017-05-27 MED ORDER — LEVOTHYROXINE SODIUM 125 MCG PO TABS: 125.0000 ug | ORAL_TABLET | Freq: Every day | ORAL | 3 refills | Status: DC

## 2017-05-27 MED ORDER — METFORMIN HCL ER 500 MG PO TB24: 500.0000 mg | ORAL_TABLET | Freq: Every day | ORAL | 1 refills | Status: DC

## 2017-05-27 MED ORDER — HYDROCHLOROTHIAZIDE 25 MG PO TABS: 25.0000 mg | ORAL_TABLET | Freq: Every day | ORAL | 3 refills | Status: DC

## 2017-05-27 NOTE — Patient Instructions (Signed)
Thank you for coming in today. Ask you insurance about Orthovisc again.  We will consider PRP.  Recheck with me as needed.   Call or go to the ER if you develop a large red swollen joint with extreme pain or oozing puss.

## 2017-05-27 NOTE — Patient Instructions (Addendum)
Recommend a trial of over-the-counter Zaditor.

## 2017-05-27 NOTE — Progress Notes (Signed)
Subjective:    CC: BP and glucose.   HPI:  Hypertension- Pt denies chest pain, SOB, dizziness, or heart palpitations.  Taking meds as directed w/o problems.  Denies medication side effects.    Impaired fasting glucose-no increased thirst or urination. No symptoms consistent with hypoglycemia. Lab Results  Component Value Date   HGBA1C 5.9 (H) 04/30/2017    She also reports that she is been having some trouble with watery eyes.  They are not itchy but she says sometimes they feel little puffy or swollen almost like she is getting some type of allergic or pollen type reaction.  She has an appointment coming up with her eye doctor in a few weeks.  She is not having any vision changes.  She also wanted to know if she could start turmeric for her joint pain.  She had a friend that suggested it but just wanted to make sure that it was safe.  In regards to her cholesterol she wondered if she should start niacin.  She read this in her [employer].  She is also been drinking apple cider vinegar to help with her cholesterol.  Past medical history, Surgical history, Family history not pertinant except as noted below, Social history, Allergies, and medications have been entered into the medical record, reviewed, and corrections made.   Review of Systems: No fevers, chills, night sweats, weight loss, chest pain, or shortness of breath.   Objective:    General: Well Developed, well nourished, and in no acute distress.  Neuro: Alert and oriented x3, extra-ocular muscles intact, sensation grossly intact.  HEENT: Normocephalic, atraumatic  Skin: Warm and dry, no rashes. Cardiac: Regular rate and rhythm, no murmurs rubs or gallops, no lower extremity edema.  Respiratory: Clear to auscultation bilaterally. Not using accessory muscles, speaking in full sentences.   Impression and Recommendations:   HTN - Well controlled. Continue current regimen. Follow up in  52months.    IFG -stable.  We will  continue to monitor every 6 months.  Continue work on [employer] regular exercise.  I would love to see her lose about 20 pounds and get under 200 pounds.  Eye watering/Epiphora- can be caused by multiple things including allergic conjunctivitis versus clogging of the tear ducts etc.  For now we will try Zaditor for the next week or 2 to see if she gets any symptom improvement.  Keep follow-up with eye doctor in a couple weeks.  If he gets worse or she notices any vision changes then recommend that she be seen sooner..    Arthritis of several joints-I certainly think it is reasonable if she wants to try to tumeric.  If it is not helpful then I would not continue to spend money on it.  But it should not interact with any of her current medications.  For her cholesterol I did not recommend a trial of niacin.  Explained that it has not shown any significant reduction in cardiovascular disease or stroke so would not take on the risks associated with taking niacin.  She will be due for a tetanus vaccine next month.  So reminded her at next office visit we will update that.

## 2017-05-28 NOTE — Progress Notes (Signed)
[name] [name] is a 62 y.o. female who presents to [place] today for knee pain.  Goldye has bilateral knee pain due to DJD.  In the past she has had steroid injections and is interested possible.  The last injections lasted about 2 months.  She notes pain is worse with activity and better with rest.  She notes the pain interferes with her ability to exercise.  She denies any radiating pain weakness or numbness.   Past Medical History:  Diagnosis Date  . HTN (hypertension) 04/04/2010   Qualifier: Diagnosis of  By: Esmeralda Arthur    . Hyperlipidemia   . Hypothyroidism   . Migraine    hx of  . Migraines   . Obesity   . Obesity    Past Surgical History:  Procedure Laterality Date  . ABDOMINAL HYSTERECTOMY  2005   complete  . CATARACT EXTRACTION W/ INTRAOCULAR LENS IMPLANT Left 05/10/14  . dexa  4/07   (+) 1.7  . JOINT REPLACEMENT     L hip replacement 2011  . migraines, resolved  2003  . Right foot surgery  2000   callus removed   Social History   Tobacco Use  . Smoking status: Former Smoker    Last attempt to quit: 05/20/1994    Years since quitting: 23.0  . Smokeless tobacco: Never Used  Substance Use Topics  . Alcohol use: Yes    Alcohol/week: 1.8 oz    Types: 3 Glasses of wine per week    Comment: 2-3 glasses of wine on weekends     ROS:  As above   Medications: Current Outpatient Medications  Medication Sig Dispense Refill  . Ascorbic Acid (VITAMIN C) 1000 MG tablet Take 1 tablet by mouth daily.    . Calcium Carbonate (CALCIUM 600 PO) Take by mouth.    . Cholecalciferol (D-3-5) 5000 UNITS capsule Take 5,000 Units by mouth daily.    . Cyanocobalamin (B-12) 2500 MCG TABS Take 1 tablet by mouth daily.    . fish oil-omega-3 fatty acids 1000 MG capsule Take 1 g by mouth 2 (two) times daily.     . Multiple Vitamin (MULTIVITAMIN) capsule Take 1 capsule by mouth daily.    . timolol (TIMOPTIC) 0.5 % ophthalmic  solution     . vitamin E 1000 UNIT capsule Take 1,000 Units by mouth daily.    . Zinc 50 MG TABS Take 50 mg by mouth daily.    . hydrochlorothiazide (HYDRODIURIL) 25 MG tablet Take 1 tablet (25 mg total) by mouth daily. 90 tablet 3  . levothyroxine (SYNTHROID, LEVOTHROID) 125 MCG tablet Take 1 tablet (125 mcg total) by mouth daily. 90 tablet 3  . metFORMIN (GLUCOPHAGE XR) 500 MG 24 hr tablet Take 1 tablet (500 mg total) by mouth daily with breakfast. 180 tablet 1   No current facility-administered medications for this visit.    Allergies  Allergen Reactions  . Lipitor [Atorvastatin] Other (See Comments)    Myalgia  . Lisinopril     Throat swelling  . Simvastatin Other (See Comments)    Myalgias and joint pain.  Chanda Busing [Pitavastatin] Rash    Rash on face, itchy skin, joint stiffness     Exam:  BP (!) 156/82   Pulse 88   Ht 5\' 9"  (1.753 m)   Wt 216 lb (98 kg)   BMI 31.90 kg/m  General: Well Developed, well nourished, and in no acute distress.  Neuro/Psych: Alert and  oriented x3, extra-ocular muscles intact, able to move all 4 extremities, sensation grossly intact. Skin: Warm and dry, no rashes noted.  Respiratory: Not using accessory muscles, speaking in full sentences, trachea midline.  Cardiovascular: Pulses palpable, no extremity edema. Abdomen: Does not appear distended. MSK:  Right knee: Moderate effusion no erythema Nontender. Normal range of motion. Stable ligamentous exam.  Left knee: minimal effusion no erythema. Nontender. Normal range of motion Normal stable ligamentous exam.  Procedure: Real-time Ultrasound Guided Injection of right knee Device: GE Logiq E  Images permanently stored and available for review in the ultrasound unit. Verbal informed consent obtained. Discussed risks and benefits of procedure. Warned about infection bleeding damage to structures skin hypopigmentation and fat atrophy among others. Patient expresses understanding and  agreement Time-out conducted.  Noted no overlying erythema, induration, or other signs of local infection.  Skin prepped in a sterile fashion.  Local anesthesia: Topical Ethyl chloride.  With sterile technique and under real time ultrasound guidance: 80mg  depomedrol and 30ml marcaine injected easily.  Completed without difficulty  Pain immediately resolved suggesting accurate placement of the medication.  Advised to call if fevers/chills, erythema, induration, drainage, or persistent bleeding.  Images permanently stored and available for review in the ultrasound unit.  Impression: Technically successful ultrasound guided injection.   Procedure: Real-time Ultrasound Guided Injection of left knee  Device: GE Logiq E  Images permanently stored and available for review in the ultrasound unit. Verbal informed consent obtained. Discussed risks and benefits of procedure. Warned about infection bleeding damage to structures skin hypopigmentation and fat atrophy among others. Patient expresses understanding and agreement Time-out conducted.  Noted no overlying erythema, induration, or other signs of local infection.  Skin prepped in a sterile fashion.  Local anesthesia: Topical Ethyl chloride.  With sterile technique and under real time ultrasound guidance: 80mg  depomedrol and 75ml marcaine injected easily.  Completed without difficulty  Pain immediately resolved suggesting accurate placement of the medication.  Advised to call if fevers/chills, erythema, induration, drainage, or persistent bleeding.  Images permanently stored and available for review in the ultrasound unit.  Impression: Technically successful ultrasound guided injection.     No results found for this or any previous visit (from the past 48 hour(s)). No results found.    Assessment and Plan: 62 y.o. female with  Knee pain due to DJD.  Status post steroid injection today.  We also discussed other  options including hyaluronic acid injections versus PRP injections.  To think about it otherwise recheck after 3 months for repeat injection if needed.  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    No orders of the defined types were placed in this encounter.  No orders of the defined types were placed in this encounter.   Discussed warning signs or symptoms. Please see discharge instructions. Patient expresses understanding.  I spent 25 minutes with this patient, greater than 50% was face-to-face time counseling regarding other treatment options.

## 2017-06-04 ENCOUNTER — Encounter: Payer: Self-pay | Admitting: Family Medicine

## 2017-06-16 ENCOUNTER — Ambulatory Visit (INDEPENDENT_AMBULATORY_CARE_PROVIDER_SITE_OTHER): Payer: 59 | Admitting: Physician Assistant

## 2017-06-16 VITALS — BP 158/80 | HR 81 | Temp 99.0°F

## 2017-06-16 DIAGNOSIS — Z23 Encounter for immunization: Secondary | ICD-10-CM | POA: Diagnosis not present

## 2017-06-16 DIAGNOSIS — I10 Essential (primary) hypertension: Secondary | ICD-10-CM

## 2017-06-16 NOTE — Progress Notes (Signed)
Pt came into clinic today for shingles #1 vaccine. Pt tolerated injection in left deltoid well, no immediate complications. Pt was hypertensive today in office, she reports this is usual when she comes to the Dr. Martin Majestic over readings with Provider in clinic, advised Pt to check her BP at home daily for 2 weeks and call us with those readings. She will and call us with the readings.

## 2017-06-27 ENCOUNTER — Encounter: Payer: Self-pay | Admitting: Physician Assistant

## 2017-07-01 ENCOUNTER — Other Ambulatory Visit: Payer: Self-pay | Admitting: Physician Assistant

## 2017-07-01 MED ORDER — AMLODIPINE BESYLATE 2.5 MG PO TABS
2.5000 mg | ORAL_TABLET | Freq: Every day | ORAL | 0 refills | Status: DC
Start: 2017-07-01 — End: 2017-09-28

## 2017-07-01 NOTE — Progress Notes (Signed)
nor

## 2017-07-14 ENCOUNTER — Encounter: Payer: Self-pay | Admitting: Family Medicine

## 2017-08-13 ENCOUNTER — Encounter: Payer: Self-pay | Admitting: Physician Assistant

## 2017-09-01 ENCOUNTER — Encounter: Payer: Self-pay | Admitting: Family Medicine

## 2017-09-01 DIAGNOSIS — E782 Mixed hyperlipidemia: Secondary | ICD-10-CM

## 2017-09-02 ENCOUNTER — Ambulatory Visit (INDEPENDENT_AMBULATORY_CARE_PROVIDER_SITE_OTHER): Payer: 59 | Admitting: Family Medicine

## 2017-09-02 VITALS — Temp 98.3°F

## 2017-09-02 DIAGNOSIS — Z23 Encounter for immunization: Secondary | ICD-10-CM | POA: Diagnosis not present

## 2017-09-02 NOTE — Progress Notes (Signed)
   Subjective:    Patient ID: [name] [name], female    DOB: 06-19-1955, 62 y.o.   MRN: [id number]  HPI  Annye is here for last shingles vaccine.   Review of Systems     Objective:   Physical Exam        Assessment & Plan:  Patient tolerated injection well without complications.

## 2017-09-03 NOTE — Progress Notes (Signed)
Agree with documentation as above.   Ridgely Anastacio, MD  

## 2017-09-11 ENCOUNTER — Ambulatory Visit (INDEPENDENT_AMBULATORY_CARE_PROVIDER_SITE_OTHER): Payer: 59 | Admitting: Family Medicine

## 2017-09-11 ENCOUNTER — Encounter: Payer: Self-pay | Admitting: Family Medicine

## 2017-09-11 ENCOUNTER — Telehealth: Payer: Self-pay | Admitting: Family Medicine

## 2017-09-11 VITALS — BP 137/24 | HR 74 | Ht 69.02 in | Wt 214.0 lb

## 2017-09-11 DIAGNOSIS — M25561 Pain in right knee: Secondary | ICD-10-CM | POA: Diagnosis not present

## 2017-09-11 DIAGNOSIS — M25562 Pain in left knee: Secondary | ICD-10-CM

## 2017-09-11 DIAGNOSIS — Z23 Encounter for immunization: Secondary | ICD-10-CM

## 2017-09-11 DIAGNOSIS — G8929 Other chronic pain: Secondary | ICD-10-CM | POA: Diagnosis not present

## 2017-09-11 DIAGNOSIS — M170 Bilateral primary osteoarthritis of knee: Secondary | ICD-10-CM

## 2017-09-11 LAB — LIPID PANEL
CHOL/HDL RATIO: 4 (calc) (ref <5.0)
Cholesterol: 250 mg/dL — ABNORMAL HIGH (ref <200)
HDL: 63 mg/dL (ref >50)
LDL Cholesterol (Calc): 162 mg/dL (calc) — ABNORMAL HIGH
NON-HDL CHOLESTEROL (CALC): 187 mg/dL — AB (ref <130)
Triglycerides: 123 mg/dL (ref <150)

## 2017-09-11 NOTE — Patient Instructions (Addendum)
Thank you for coming in today. Call or go to the ER if you develop a large red swollen joint with extreme pain or oozing puss.  We will work on authorizing the [employer].   Work on weight loss and quad strength.

## 2017-09-11 NOTE — Telephone Encounter (Signed)
-----   Message from Tasia Catchings, [place] sent at 09/11/2017  9:23 AM EDT ----- Information has been submitted to [facility] and awaiting determination.   ----- Message ----- From: Tasia Catchings, CMA Sent: 09/11/2017   8:33 AM To: Tasia Catchings, CMA  Per Dr. Georgina Snell work on [employer] injections approval.

## 2017-09-11 NOTE — Progress Notes (Signed)
.  tf

## 2017-09-11 NOTE — Progress Notes (Signed)
[name] [name] is a 62 y.o. female who presents to [place] today for knee pain.  [name] [name] has bilateral knee pain due to DJD.  She last had a steroid injection over 3 months ago.  She notes the injections lasted about a month and a half to about 2 months.  She continues to try over-the-counter NSAIDs which helps some.She continues to try OTC NSAIDs which help som  She notes pain is worse with activity and better with rest.  She notes the pain interferes with her ability to exercise.  She denies any radiating pain weakness or numbness.   Past Medical History:  Diagnosis Date  . HTN (hypertension) 04/04/2010   Qualifier: Diagnosis of  By: Esmeralda Arthur    . Hyperlipidemia   . Hypothyroidism   . Migraine    hx of  . Migraines   . Obesity   . Obesity    Past Surgical History:  Procedure Laterality Date  . ABDOMINAL HYSTERECTOMY  2005   complete  . CATARACT EXTRACTION W/ INTRAOCULAR LENS IMPLANT Left 05/10/14  . dexa  4/07   (+) 1.7  . JOINT REPLACEMENT     L hip replacement 2011  . migraines, resolved  2003  . Right foot surgery  2000   callus removed   Social History   Tobacco Use  . Smoking status: Former Smoker    Last attempt to quit: 05/20/1994    Years since quitting: 23.3  . Smokeless tobacco: Never Used  Substance Use Topics  . Alcohol use: Yes    Alcohol/week: 1.8 oz    Types: 3 Glasses of wine per week    Comment: 2-3 glasses of wine on weekends     ROS:  As above   Medications: Current Outpatient Medications  Medication Sig Dispense Refill  . amLODipine (NORVASC) 2.5 MG tablet Take 1 tablet (2.5 mg total) by mouth daily. 90 tablet 0  . Calcium Carbonate (CALCIUM 600 PO) Take by mouth.    . Cholecalciferol (D-3-5) 5000 UNITS capsule Take 5,000 Units by mouth daily.    . Cyanocobalamin (B-12) 2500 MCG TABS Take 1 tablet by mouth daily.    . fish oil-omega-3 fatty acids 1000 MG capsule Take 1 g by mouth 2  (two) times daily.     . hydrochlorothiazide (HYDRODIURIL) 25 MG tablet Take 1 tablet (25 mg total) by mouth daily. 90 tablet 3  . levothyroxine (SYNTHROID, LEVOTHROID) 125 MCG tablet Take 1 tablet (125 mcg total) by mouth daily. 90 tablet 3  . metFORMIN (GLUCOPHAGE XR) 500 MG 24 hr tablet Take 1 tablet (500 mg total) by mouth daily with breakfast. 180 tablet 1  . Multiple Vitamin (MULTIVITAMIN) capsule Take 1 capsule by mouth daily.    . timolol (TIMOPTIC) 0.5 % ophthalmic solution      No current facility-administered medications for this visit.    Allergies  Allergen Reactions  . Lipitor [Atorvastatin] Other (See Comments)    Myalgia  . Lisinopril     Throat swelling  . Simvastatin Other (See Comments)    Myalgias and joint pain.  Chanda Busing [Pitavastatin] Rash    Rash on face, itchy skin, joint stiffness     Exam:  BP (!) 137/24   Pulse 74   Ht 5' 9.02" (1.753 m)   Wt 214 lb (97.1 kg)   SpO2 96%   BMI 31.59 kg/m  General: Well Developed, well nourished, and in no acute distress.  Neuro/Psych: Alert  and oriented x3, extra-ocular muscles intact, able to move all 4 extremities, sensation grossly intact. Skin: Warm and dry, no rashes noted.  Respiratory: Not using accessory muscles, speaking in full sentences, trachea midline.  Cardiovascular: Pulses palpable, no extremity edema. Abdomen: Does not appear distended. MSK:  Right knee: Moderate effusion no erythema Nontender. Normal range of motion. Stable ligamentous exam.  Left knee: minimal effusion no erythema. Nontender. Normal range of motion Normal stable ligamentous exam.  Procedure: Real-time Ultrasound Guided Injection of right knee Device: GE Logiq E  Images permanently stored and available for review in the ultrasound unit. Verbal informed consent obtained. Discussed risks and benefits of procedure. Warned about infection bleeding damage to structures skin hypopigmentation and fat atrophy among  others. Patient expresses understanding and agreement Time-out conducted.  Noted no overlying erythema, induration, or other signs of local infection.  Skin prepped in a sterile fashion.  Local anesthesia: Topical Ethyl chloride.  With sterile technique and under real time ultrasound guidance: 80mg  kenalog and 35ml marcaine injected easily.  Completed without difficulty  Pain immediately resolved suggesting accurate placement of the medication.  Advised to call if fevers/chills, erythema, induration, drainage, or persistent bleeding.  Images permanently stored and available for review in the ultrasound unit.  Impression: Technically successful ultrasound guided injection.   Procedure: Real-time Ultrasound Guided Injection of left knee  Device: GE Logiq E  Images permanently stored and available for review in the ultrasound unit. Verbal informed consent obtained. Discussed risks and benefits of procedure. Warned about infection bleeding damage to structures skin hypopigmentation and fat atrophy among others. Patient expresses understanding and agreement Time-out conducted.  Noted no overlying erythema, induration, or other signs of local infection.  Skin prepped in a sterile fashion.  Local anesthesia: Topical Ethyl chloride.  With sterile technique and under real time ultrasound guidance: 80mg  kenalog and 46ml marcaine injected easily.  Completed without difficulty  Pain immediately resolved suggesting accurate placement of the medication.  Advised to call if fevers/chills, erythema, induration, drainage, or persistent bleeding.  Images permanently stored and available for review in the ultrasound unit.  Impression: Technically successful ultrasound guided injection.     No results found for this or any previous visit (from the past 48 hour(s)). No results found.    Assessment and Plan: 62 y.o. female with  Knee pain due to DJD.  Status post steroid injection  today.  We also discussed other options including hyaluronic acid injections versus PRP injections.  Will work on authorizing the [employer].  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  Tdap given prior to D/C  Orders Placed This Encounter  Procedures  . Tdap vaccine greater than or equal to 7yo IM   No orders of the defined types were placed in this encounter.   Discussed warning signs or symptoms. Please see discharge instructions. Patient expresses understanding.  I spent 15 minutes with this patient, greater than 50% was face-to-face time counseling regarding other treatment options.

## 2017-09-12 LAB — LDL CHOLESTEROL, DIRECT: Direct LDL: 156 mg/dL — ABNORMAL HIGH (ref <100)

## 2017-09-15 ENCOUNTER — Encounter: Payer: Self-pay | Admitting: Family Medicine

## 2017-09-15 NOTE — Telephone Encounter (Signed)
[employer] is covered. There is no copay. After the deductible has been met, the patient's responsibility will be 20% of the allowable amount. Once the out of pocket is met, the patient will have no financial responsibility. Call reference number is [id number].    Left message for patient to call back.

## 2017-09-15 NOTE — Telephone Encounter (Signed)
Patient is unable to pay for the injections at this time. She will check back at a later date.

## 2017-09-28 ENCOUNTER — Other Ambulatory Visit: Payer: Self-pay | Admitting: Physician Assistant

## 2017-09-29 MED ORDER — AMLODIPINE BESYLATE 2.5 MG PO TABS: 2.5000 mg | ORAL_TABLET | Freq: Every day | ORAL | 0 refills | Status: DC

## 2017-11-17 ENCOUNTER — Ambulatory Visit: Payer: 59 | Admitting: Family Medicine

## 2017-11-17 ENCOUNTER — Encounter: Payer: Self-pay | Admitting: Family Medicine

## 2017-11-17 ENCOUNTER — Ambulatory Visit (INDEPENDENT_AMBULATORY_CARE_PROVIDER_SITE_OTHER): Payer: 59 | Admitting: Family Medicine

## 2017-11-17 VITALS — BP 120/58 | HR 87 | Ht 69.0 in | Wt 213.0 lb

## 2017-11-17 DIAGNOSIS — E039 Hypothyroidism, unspecified: Secondary | ICD-10-CM

## 2017-11-17 DIAGNOSIS — E782 Mixed hyperlipidemia: Secondary | ICD-10-CM | POA: Diagnosis not present

## 2017-11-17 DIAGNOSIS — R7301 Impaired fasting glucose: Secondary | ICD-10-CM

## 2017-11-17 DIAGNOSIS — I10 Essential (primary) hypertension: Secondary | ICD-10-CM | POA: Diagnosis not present

## 2017-11-17 LAB — POCT GLYCOSYLATED HEMOGLOBIN (HGB A1C): HEMOGLOBIN A1C: 6 % — AB (ref 4.0–5.6)

## 2017-11-17 MED ORDER — AMLODIPINE BESYLATE 2.5 MG PO TABS: 2.5000 mg | ORAL_TABLET | Freq: Every day | ORAL | 2 refills | Status: DC

## 2017-11-17 MED ORDER — LIVALO 2 MG PO TABS: 2.0000 mg | ORAL_TABLET | Freq: Every day | ORAL | 3 refills | Status: DC

## 2017-11-17 NOTE — Progress Notes (Signed)
Subjective:    CC: HTN, glucose  HPI: Hypertension- Pt denies chest pain, SOB, dizziness, or heart palpitations.  Taking meds as directed w/o problems.  Denies medication side effects.    Impaired fasting glucose-no increased thirst or urination. No symptoms consistent with hypoglycemia.  Hyperlipidemia - not well controlled.  Restarted Livalo in April.  tolerateing well with no S.E.  Lab Results  Component Value Date   CHOL 250 (H) 09/11/2017   HDL 63 09/11/2017   LDLCALC 162 (H) 09/11/2017   LDLDIRECT 156 (H) 09/11/2017   TRIG 123 09/11/2017   CHOLHDL 4.0 09/11/2017   Hypothyroid -  had had some dry skin around her eyes.  No major weight changes.   Lab Results  Component Value Date   TSH 1.30 11/25/2016     Past medical history, Surgical history, Family history not pertinant except as noted below, Social history, Allergies, and medications have been entered into the medical record, reviewed, and corrections made.   Review of Systems: No fevers, chills, night sweats, weight loss, chest pain, or shortness of breath.   Objective:    General: Well Developed, well nourished, and in no acute distress.  Neuro: Alert and oriented x3, extra-ocular muscles intact, sensation grossly intact.  HEENT: Normocephalic, atraumatic  Skin: Warm and dry, no rashes. Cardiac: Regular rate and rhythm, no murmurs rubs or gallops, no lower extremity edema.  Respiratory: Clear to auscultation bilaterally. Not using accessory muscles, speaking in full sentences.   Impression and Recommendations:    HTN - Well controlled. Continue current regimen. Follow up in  34months.   IFG - Well controlled. Continue current regimen. Follow up in  6 months. Continue cmetformin.   Hyperlipidemia - On LIvalo. Due to recheck lipids.    Hypothyroid - due to recheck TSH. Will adjust dose if needed.   Dry skin around eyes/eczema - has been moisturizing well.  Can try a steroid if neeeded.

## 2017-11-21 LAB — TSH: TSH: 1.13 mIU/L (ref 0.40–4.50)

## 2017-11-21 LAB — LIPID PANEL
CHOL/HDL RATIO: 2.9 (calc) (ref <5.0)
CHOLESTEROL: 200 mg/dL — AB (ref <200)
HDL: 68 mg/dL (ref >50)
LDL Cholesterol (Calc): 112 mg/dL (calc) — ABNORMAL HIGH
Non-HDL Cholesterol (Calc): 132 mg/dL (calc) — ABNORMAL HIGH (ref <130)
Triglycerides: 94 mg/dL (ref <150)

## 2017-11-21 LAB — COMPLETE METABOLIC PANEL WITH GFR
AG Ratio: 1.9 (calc) (ref 1.0–2.5)
ALBUMIN MSPROF: 4.4 g/dL (ref 3.6–5.1)
ALT: 19 U/L (ref 6–29)
AST: 16 U/L (ref 10–35)
Alkaline phosphatase (APISO): 73 U/L (ref 33–130)
BUN: 25 mg/dL (ref 7–25)
CALCIUM: 10.1 mg/dL (ref 8.6–10.4)
CO2: 27 mmol/L (ref 20–32)
Chloride: 105 mmol/L (ref 98–110)
Creat: 0.6 mg/dL (ref 0.50–0.99)
GFR, EST NON AFRICAN AMERICAN: 98 mL/min/{1.73_m2} (ref >=60)
GFR, Est African American: 114 mL/min/{1.73_m2} (ref >=60)
GLOBULIN: 2.3 g/dL (ref 1.9–3.7)
Glucose, Bld: 112 mg/dL — ABNORMAL HIGH (ref 65–99)
POTASSIUM: 4.2 mmol/L (ref 3.5–5.3)
SODIUM: 139 mmol/L (ref 135–146)
Total Bilirubin: 0.5 mg/dL (ref 0.2–1.2)
Total Protein: 6.7 g/dL (ref 6.1–8.1)

## 2017-11-24 ENCOUNTER — Ambulatory Visit: Payer: 59 | Admitting: Family Medicine

## 2018-02-04 ENCOUNTER — Ambulatory Visit: Payer: 59 | Admitting: Family Medicine

## 2018-02-12 ENCOUNTER — Telehealth: Payer: Self-pay | Admitting: *Deleted

## 2018-02-12 NOTE — Telephone Encounter (Signed)
lvm advising pt that we received a fax from [facility] and I wanted to know if she has switched mail order pharmacies? She has [employer] [facility] order listed on her chart. Asked that she return call to verify.[name] [name], [place]

## 2018-02-13 NOTE — Telephone Encounter (Signed)
Henny called back and states [place] has merged. She still uses [facility] order.

## 2018-02-13 NOTE — Telephone Encounter (Signed)
Thanks.Cam Dauphin Lynetta, CMA  

## 2018-02-24 NOTE — Telephone Encounter (Signed)
Received fax from [facility] that [name] [name] has been approved from 02/24/2018 through 02/24/2019. Form sent to scan.-hsm.

## 2018-03-17 ENCOUNTER — Other Ambulatory Visit: Payer: Self-pay | Admitting: Family Medicine

## 2018-03-17 DIAGNOSIS — Z1231 Encounter for screening mammogram for malignant neoplasm of breast: Secondary | ICD-10-CM

## 2018-03-24 ENCOUNTER — Encounter: Payer: Self-pay | Admitting: Family Medicine

## 2018-03-24 ENCOUNTER — Ambulatory Visit (INDEPENDENT_AMBULATORY_CARE_PROVIDER_SITE_OTHER): Payer: 59 | Admitting: Family Medicine

## 2018-03-24 VITALS — BP 167/63 | HR 70 | Ht 69.0 in | Wt 216.0 lb

## 2018-03-24 DIAGNOSIS — M25561 Pain in right knee: Secondary | ICD-10-CM

## 2018-03-24 DIAGNOSIS — M170 Bilateral primary osteoarthritis of knee: Secondary | ICD-10-CM

## 2018-03-24 DIAGNOSIS — G8929 Other chronic pain: Secondary | ICD-10-CM | POA: Diagnosis not present

## 2018-03-24 DIAGNOSIS — M25562 Pain in left knee: Secondary | ICD-10-CM

## 2018-03-24 NOTE — Patient Instructions (Addendum)
Thank you for coming in today. Follow up with Dr Alvan Dame.  Start thinking about planning for total knee replacement.  Recheck with me as needed.  I can continue to injection but I think getting replacement in the future is a good idea.    Total Knee Replacement Total knee replacement is a procedure to replace the knee joint with an artificial (prosthetic) knee joint. The purpose of this surgery is to reduce knee pain and improve knee function. The prosthetic knee joint (prosthesis) may be made of metal, plastic or ceramic. It replaces parts of the thigh bone (femur), lower leg bone (tibia), and kneecap (patella) that are removed during the procedure. Tell a health care provider about:  Any allergies you have.  All medicines you are taking, including vitamins, herbs, eye drops, creams, and over-the-counter medicines.  Any problems you or family members have had with anesthetic medicines.  Any blood disorders you have.  Any surgeries you have had.  Any medical conditions you have.  Whether you are pregnant or may be pregnant. What are the risks? Generally, this is a safe procedure. However, problems may occur, including:  Infection.  Bleeding.  Allergic reactions to medicines.  Damage to other structures or organs.  Decreased range of motion of the knee.  Instability of the knee.  Loosening of the prosthetic joint.  Knee pain that does not go away (chronic pain).  What happens before the procedure?  Ask your health care provider about: ? Changing or stopping your regular medicines. This is especially important if you are taking diabetes medicines or blood thinners. ? Taking medicines such as aspirin and ibuprofen. These medicines can thin your blood. Do not take these medicines before your procedure if your health care provider instructs you not to.  Have dental care and routine cleanings completed before your procedure. Plan to not have dental work done for 3 months  after your procedure. Germs from anywhere in your body, including your mouth, can travel to your new joint and infect it.  Follow instructions from your health care provider about eating or drinking restrictions.  Ask your health care provider how your surgical site will be marked or identified.  You may be given antibiotic medicine to help prevent infection.  If your health care provider prescribes physical therapy, do exercises as instructed.  Do not use any tobacco products, such as cigarettes, chewing tobacco, or e-cigarettes. If you need help quitting, ask your health care provider.  You may have a physical exam.  You may have tests, such as: ? X-rays. ? MRI. ? CT scan. ? Bone scans.  You may have a blood or urine sample taken.  Plan to have someone take you home after the procedure.  If you will be going home right after the procedure, plan to have someone with you for at least 24 hours. It is recommended that you have someone to help care for you for at least 4-6 weeks after your procedure. What happens during the procedure?  To reduce your risk of infection: ? Your health care team will wash or sanitize their hands. ? Your skin will be washed with soap.  An IV tube will be inserted into one of your veins.  You will be given one or more of the following: ? A medicine to help you relax (sedative). ? A medicine to numb the area (local anesthetic). ? A medicine to make you fall asleep (general anesthetic). ? A medicine that is injected into your spine  to numb the area below and slightly above the injection site (spinal anesthetic). ? A medicine that is injected into an area of your body to numb everything below the injection site (regional anesthetic).  An incision will be made in your knee.  Damaged cartilage and bone will be removed from your femur, tibia, and patella.  Parts of the prosthesis (liners) will be placed over the areas of bone and cartilage that were  removed. A metal liner will be placed over your femur, and plastic liners will be placed over your tibia and the underside of your patella.  One or more small tubes (drains) may be placed near your incision to help drain extra fluid from your surgical site.  Your incision will be closed with stitches (sutures), skin glue, or adhesive strips. Medicine may be applied to your incision.  A bandage (dressing) will be placed over your incision. The procedure may vary among health care providers and hospitals. What happens after the procedure?  Your blood pressure, heart rate, breathing rate, and blood oxygen level will be monitored often until the medicines you were given have worn off.  You may continue to receive fluids and medicines through an IV tube.  You will have some pain. Pain medicines will be available to help you.  You may have fluid coming from one or more drains in your incision.  You may have to wear compression stockings. These stockings help to prevent blood clots and reduce swelling in your legs.  You will be encouraged to move around as much as possible.  You may be given a continuous passive motion machine to use at home. You will be shown how to use this machine.  Do not drive for 24 hours if you received a sedative. This information is not intended to replace advice given to you by your health care provider. Make sure you discuss any questions you have with your health care provider. Document Released: 08/12/2000 Document Revised: 06/08/2016 Document Reviewed: 04/12/2015 Elsevier Interactive Patient Education  [employer].

## 2018-03-24 NOTE — Progress Notes (Signed)
[name] [name] is a 62 y.o. female who presents to [facility] today for knee pain.  [name] [name] has a history of bilateral knee pain due to DJD.  She has been receiving conservative management including quad strengthening exercise, weight loss advice and dietary modification, diclofenac gel, and intermittent steroid injections.  Her last injection was in April.  She notes the injections bilaterally lasting about a month or 2 and the pain is slowly worsened.  She notes knee pain and swelling.  She has significant difficulties with walking as that does really worsen her pain.  She notes her pain is now interfering with her life at home and at work.  She no longer is exercising and has difficulty bending down and standing up again.  She is a history of a total hip replacement performed by Dr. Alvan Dame about 7 years ago.  She would like to delay surgery if possible but notes that it is likely inevitable.  Additionally she had evaluation for viscosupplementation injection and notes that it was very expensive when we authorized earlier this year and she elected to not pursue this.    ROS:  As above  Exam:  BP (!) 167/63   Pulse 70   Ht 5\' 9"  (1.753 m)   Wt 216 lb (98 kg)   BMI 31.90 kg/m  General: Well Developed, well nourished, and in no acute distress.  Neuro/Psych: Alert and oriented x3, extra-ocular muscles intact, able to move all 4 extremities, sensation grossly intact. Skin: Warm and dry, no rashes noted.  Respiratory: Not using accessory muscles, speaking in full sentences, trachea midline.  Cardiovascular: Pulses palpable, no extremity edema. Abdomen: Does not appear distended. MSK:  Right knee: Moderate effusion otherwise normal-appearing. Range of motion 3-110 degrees. Mildly tender to palpation medial and lateral joint lines. Intact strength.  Left knee: Moderate effusion. Range of motion 5 to 100 degrees. Diffusely tender.  Intact  strength.  Patient walks with an antalgic gait.    Lab and Radiology Results Procedure: Real-time Ultrasound Guided Injection of right knee  Device: GE Logiq E   Images permanently stored and available for review in the ultrasound unit. Verbal informed consent obtained.  Discussed risks and benefits of procedure. Warned about infection bleeding damage to structures skin hypopigmentation and fat atrophy among others. Patient expresses understanding and agreement Time-out conducted.   Noted no overlying erythema, induration, or other signs of local infection.   Skin prepped in a sterile fashion.   Local anesthesia: Topical Ethyl chloride.   With sterile technique and under real time ultrasound guidance:  80mg  kenalog and 45ml marcaine injected easily.   Completed without difficulty   Pain immediately resolved suggesting accurate placement of the medication.   Advised to call if fevers/chills, erythema, induration, drainage, or persistent bleeding.   Images permanently stored and available for review in the ultrasound unit.  Impression: Technically successful ultrasound guided injection.  Procedure: Real-time Ultrasound Guided Injection of left knee  Device: GE Logiq E   Images permanently stored and available for review in the ultrasound unit. Verbal informed consent obtained.  Discussed risks and benefits of procedure. Warned about infection bleeding damage to structures skin hypopigmentation and fat atrophy among others. Patient expresses understanding and agreement Time-out conducted.   Noted no overlying erythema, induration, or other signs of local infection.   Skin prepped in a sterile fashion.   Local anesthesia: Topical Ethyl chloride.   With sterile technique and under real time ultrasound guidance:  80mg  kenalog and 2ml marcaine injected easily.   Completed without difficulty   Pain immediately resolved suggesting accurate placement of the medication.   Advised to call if  fevers/chills, erythema, induration, drainage, or persistent bleeding.   Images permanently stored and available for review in the ultrasound unit.  Impression: Technically successful ultrasound guided injection.     Assessment and Plan: 62 y.o. female with  Knee pain bilaterally due to significant DJD.  Last x-ray on record several years old and shows severe DJD I had a lengthy discussion with patient about expected outcomes.  I do not see the utility in pursuing further conservative management is [name] [name] is a good surgical candidate.  She is done well with her prior large joint replacement (hip) and is young enough and healthy enough to do well with the postsurgical rehab.  Her knee pain is significantly impairing her quality of life.  Plan for repeat injection as above today and refer back to Dr. Alvan Dame for evaluation management of bilateral total knee replacement.  As I think it is very likely that she will have x-rays performed at her orthopedic office I have elected not to pursue for repeat x-ray today as I do not want to duplicate work and costs.    I spent 25 minutes with this patient, greater than 50% was face-to-face time counseling regarding ddx and plan.  Orders Placed This Encounter  Procedures  . Ambulatory referral to Orthopedic Surgery    Referral Priority:   Routine    Referral Type:   Surgical    Referral Reason:   Specialty Services Required    Referred to Provider:   Paralee Cancel, MD    Requested Specialty:   Orthopedic Surgery    Number of Visits Requested:   1   No orders of the defined types were placed in this encounter.   Historical information moved to improve visibility of documentation.  Past Medical History:  Diagnosis Date  . HTN (hypertension) 04/04/2010   Qualifier: Diagnosis of  By: Esmeralda Arthur    . Hyperlipidemia   . Hypothyroidism   . Migraine    hx of  . Migraines   . Obesity   . Obesity    Past Surgical History:  Procedure Laterality  Date  . ABDOMINAL HYSTERECTOMY  2005   complete  . CATARACT EXTRACTION W/ INTRAOCULAR LENS IMPLANT Left 05/10/14  . dexa  4/07   (+) 1.7  . JOINT REPLACEMENT     L hip replacement 2011  . migraines, resolved  2003  . Right foot surgery  2000   callus removed   Social History   Tobacco Use  . Smoking status: Former Smoker    Last attempt to quit: 05/20/1994    Years since quitting: 23.8  . Smokeless tobacco: Never Used  Substance Use Topics  . Alcohol use: Yes    Alcohol/week: 3.0 standard drinks    Types: 3 Glasses of wine per week    Comment: 2-3 glasses of wine on weekends   family history includes CAD in her mother; Heart attack in her mother; Hyperlipidemia in her brother and mother; Hypertension in her father; Other (age of onset: 23) in her father.  Medications: Current Outpatient Medications  Medication Sig Dispense Refill  . amLODipine (NORVASC) 2.5 MG tablet Take 1 tablet (2.5 mg total) by mouth daily. 90 tablet 2  . Calcium Carbonate (CALCIUM 600 PO) Take by mouth.    . Cholecalciferol (D-3-5) 5000 UNITS capsule Take 5,000  Units by mouth daily.    . Cyanocobalamin (B-12) 2500 MCG TABS Take 1 tablet by mouth daily.    . fish oil-omega-3 fatty acids 1000 MG capsule Take 1 g by mouth 2 (two) times daily.     . hydrochlorothiazide (HYDRODIURIL) 25 MG tablet Take 1 tablet (25 mg total) by mouth daily. 90 tablet 3  . levothyroxine (SYNTHROID, LEVOTHROID) 125 MCG tablet Take 1 tablet (125 mcg total) by mouth daily. 90 tablet 3  . LIVALO 2 MG TABS Take 1 tablet (2 mg total) by mouth at bedtime. 90 tablet 3  . metFORMIN (GLUCOPHAGE XR) 500 MG 24 hr tablet Take 1 tablet (500 mg total) by mouth daily with breakfast. 180 tablet 1  . Multiple Vitamin (MULTIVITAMIN) capsule Take 1 capsule by mouth daily.    . timolol (TIMOPTIC) 0.5 % ophthalmic solution      No current facility-administered medications for this visit.    Allergies  Allergen Reactions  . Lipitor [Atorvastatin]  Other (See Comments)    Myalgia  . Lisinopril     Throat swelling  . Simvastatin Other (See Comments)    Myalgias and joint pain.  Chanda Busing [Pitavastatin] Rash    Rash on face, itchy skin, joint stiffness      Discussed warning signs or symptoms. Please see discharge instructions. Patient expresses understanding.

## 2018-04-17 ENCOUNTER — Encounter: Payer: Self-pay | Admitting: Family Medicine

## 2018-05-14 ENCOUNTER — Ambulatory Visit (INDEPENDENT_AMBULATORY_CARE_PROVIDER_SITE_OTHER): Payer: 59

## 2018-05-14 DIAGNOSIS — Z1231 Encounter for screening mammogram for malignant neoplasm of breast: Secondary | ICD-10-CM

## 2018-05-26 ENCOUNTER — Ambulatory Visit (INDEPENDENT_AMBULATORY_CARE_PROVIDER_SITE_OTHER): Payer: 59 | Admitting: Family Medicine

## 2018-05-26 ENCOUNTER — Encounter: Payer: Self-pay | Admitting: Family Medicine

## 2018-05-26 VITALS — BP 148/84 | HR 78 | Ht 69.0 in | Wt 223.0 lb

## 2018-05-26 DIAGNOSIS — E039 Hypothyroidism, unspecified: Secondary | ICD-10-CM | POA: Diagnosis not present

## 2018-05-26 DIAGNOSIS — R21 Rash and other nonspecific skin eruption: Secondary | ICD-10-CM

## 2018-05-26 DIAGNOSIS — Z1159 Encounter for screening for other viral diseases: Secondary | ICD-10-CM | POA: Diagnosis not present

## 2018-05-26 DIAGNOSIS — I10 Essential (primary) hypertension: Secondary | ICD-10-CM | POA: Diagnosis not present

## 2018-05-26 DIAGNOSIS — R7301 Impaired fasting glucose: Secondary | ICD-10-CM | POA: Diagnosis not present

## 2018-05-26 LAB — POCT GLYCOSYLATED HEMOGLOBIN (HGB A1C): Hemoglobin A1C: 6 % — AB (ref 4.0–5.6)

## 2018-05-26 MED ORDER — AMLODIPINE BESYLATE 5 MG PO TABS: 5.0000 mg | ORAL_TABLET | Freq: Every day | ORAL | 1 refills | Status: DC

## 2018-05-26 MED ORDER — LEVOTHYROXINE SODIUM 125 MCG PO TABS: 125.0000 ug | ORAL_TABLET | Freq: Every day | ORAL | 3 refills | Status: DC

## 2018-05-26 MED ORDER — TRIAMCINOLONE ACETONIDE 0.1 % EX CREA: 1.0000 "application " | TOPICAL_CREAM | Freq: Every day | CUTANEOUS | 0 refills | Status: DC

## 2018-05-26 MED ORDER — HYDROCHLOROTHIAZIDE 25 MG PO TABS: 25.0000 mg | ORAL_TABLET | Freq: Every day | ORAL | 3 refills | Status: DC

## 2018-05-26 MED ORDER — METFORMIN HCL ER 500 MG PO TB24: 500.0000 mg | ORAL_TABLET | Freq: Every day | ORAL | 1 refills | Status: DC

## 2018-05-26 NOTE — Progress Notes (Signed)
Subjective:    CC:   HPI:  Hypertension- Pt denies chest pain, SOB, dizziness, or heart palpitations.  Taking meds as directed w/o problems.  Denies medication side effects.    Impaired fasting glucose-no increased thirst or urination. No symptoms consistent with hypoglycemia.  Hypothyroidism - Taking medication regularly in the AM away from food and vitamins, etc. No recent change to skin, hair, or energy levels.  She has a rash around her right eye. Has been trying to moiturize it really well and that helps some but it never goes away. It gets red and scaly.    Past medical history, Surgical history, Family history not pertinant except as noted below, Social history, Allergies, and medications have been entered into the medical record, reviewed, and corrections made.   Review of Systems: No fevers, chills, night sweats, weight loss, chest pain, or shortness of breath.   Objective:    General: Well Developed, well nourished, and in no acute distress.  Neuro: Alert and oriented x3, extra-ocular muscles intact, sensation grossly intact.  HEENT: Normocephalic, atraumatic  Skin: Warm and dry, no rashes. Cardiac: Regular rate and rhythm, no murmurs rubs or gallops, no lower extremity edema.  Respiratory: Clear to auscultation bilaterally. Not using accessory muscles, speaking in full sentences.   Impression and Recommendations:    HTN -controlled.  Repeat blood pressure improved but still not at goal.  She is also gained a little over 10 pounds since the summer which is likely contributed to her pressure increase.  Will increase amlodipine to 5 mg.  IFG -A1c of 6.0.  Well controlled. Continue current regimen. Follow up in  6 months.    Hypothyroidism -well controlled.  Last TSH looked fantastic.  We will follow-up in 6 months.  Rash around her eye-likely eczema.  Recommend that she try increasing her moisturization especially at night.  Recommend a more heavier oil-based lotion or  moisturizer or even something like coconut oil.  If that is not helping then okay to use the triamcinolone cream.  Do not use for more than 2 consecutive weeks as it will cause skin thinning.  Did warn about digital side effects.  Due for hepatitis C screening.

## 2018-05-27 LAB — BASIC METABOLIC PANEL WITH GFR
BUN: 14 mg/dL (ref 7–25)
CO2: 27 mmol/L (ref 20–32)
Calcium: 9.6 mg/dL (ref 8.6–10.4)
Chloride: 104 mmol/L (ref 98–110)
Creat: 0.65 mg/dL (ref 0.50–0.99)
GFR, EST NON AFRICAN AMERICAN: 95 mL/min/{1.73_m2} (ref >=60)
GFR, Est African American: 110 mL/min/{1.73_m2} (ref >=60)
Glucose, Bld: 77 mg/dL (ref 65–99)
POTASSIUM: 4.2 mmol/L (ref 3.5–5.3)
SODIUM: 138 mmol/L (ref 135–146)

## 2018-05-27 LAB — HEPATITIS C ANTIBODY
Hepatitis C Ab: NONREACTIVE
SIGNAL TO CUT-OFF: 0.01 (ref <1.00)

## 2018-05-27 NOTE — Progress Notes (Signed)
All labs are normal. 

## 2018-06-09 ENCOUNTER — Encounter: Payer: Self-pay | Admitting: Family Medicine

## 2018-07-07 LAB — HM DIABETES EYE EXAM

## 2018-07-30 ENCOUNTER — Encounter: Payer: Self-pay | Admitting: Family Medicine

## 2018-08-27 ENCOUNTER — Encounter: Payer: Self-pay | Admitting: Family Medicine

## 2018-09-02 ENCOUNTER — Encounter: Payer: Self-pay | Admitting: Family Medicine

## 2018-09-29 ENCOUNTER — Encounter: Payer: Self-pay | Admitting: Family Medicine

## 2018-10-05 ENCOUNTER — Encounter: Payer: Self-pay | Admitting: Family Medicine

## 2018-10-05 ENCOUNTER — Ambulatory Visit (INDEPENDENT_AMBULATORY_CARE_PROVIDER_SITE_OTHER): Payer: 59 | Admitting: Family Medicine

## 2018-10-05 VITALS — BP 147/73 | HR 88 | Ht 69.0 in | Wt 230.0 lb

## 2018-10-05 DIAGNOSIS — L989 Disorder of the skin and subcutaneous tissue, unspecified: Secondary | ICD-10-CM

## 2018-10-05 DIAGNOSIS — L821 Other seborrheic keratosis: Secondary | ICD-10-CM | POA: Diagnosis not present

## 2018-10-05 MED ORDER — IMIQUIMOD 5 % EX CREA: TOPICAL_CREAM | CUTANEOUS | 0 refills | Status: DC

## 2018-10-05 NOTE — Progress Notes (Signed)
Acute Office Visit  Subjective:    Patient ID: [name] [name], female    DOB: [birth year], 63 y.o.   MRN: [id number]  Chief Complaint  Patient presents with  . Nevus    she reports that she has noticed that it has been a change in size in the past month or so in the mole on her chest. it has gotten larger, darker and blistered. denies any itchiness she hasn't used anything on this. she would also like for Dr. Madilyn Fireman to check another one that is on her LLback     HPI Patient is in today for she reports that she has noticed that it has been a change in size in the past month or so in the mole on her chest. it has gotten larger, darker and blistered. denies any itchiness she hasn't used anything on this. she would also like for Dr. Madilyn Fireman to check another one that is on her LLback  Past Medical History:  Diagnosis Date  . HTN (hypertension) 04/04/2010   Qualifier: Diagnosis of  By: Esmeralda Arthur    . Hyperlipidemia   . Hypothyroidism   . Migraine    hx of  . Migraines   . Obesity   . Obesity     Past Surgical History:  Procedure Laterality Date  . ABDOMINAL HYSTERECTOMY  2005   complete  . CATARACT EXTRACTION W/ INTRAOCULAR LENS IMPLANT Left 05/10/14  . dexa  4/07   (+) 1.7  . JOINT REPLACEMENT     L hip replacement 2011  . migraines, resolved  2003  . Right foot surgery  2000   callus removed    Family History  Problem Relation Age of Onset  . Heart attack Mother   . CAD Mother        stents  . Hyperlipidemia Mother   . Other Father 43       stents  . Hypertension Father   . Hyperlipidemia Brother     Social History   Socioeconomic History  . Marital status: Divorced    Spouse name: divorced  . Number of children: Not on file  . Years of education: Not on file  . Highest education level: Not on file  Occupational History    Employer: [employer]  . Financial resource strain: Not on file  . Food insecurity:    Worry: Not on  file    Inability: Not on file  . Transportation needs:    Medical: Not on file    Non-medical: Not on file  Tobacco Use  . Smoking status: Former Smoker    Last attempt to quit: 05/20/1994    Years since quitting: 24.3  . Smokeless tobacco: Never Used  Substance and Sexual Activity  . Alcohol use: Yes    Alcohol/week: 3.0 standard drinks    Types: 3 Glasses of wine per week    Comment: 2-3 glasses of wine on weekends  . Drug use: No  . Sexual activity: Not Currently  Lifestyle  . Physical activity:    Days per week: Not on file    Minutes per session: Not on file  . Stress: Not on file  Relationships  . Social connections:    Talks on phone: Not on file    Gets together: Not on file    Attends religious service: Not on file    Active member of club or organization: Not on file    Attends meetings of clubs or  organizations: Not on file    Relationship status: Not on file  . Intimate partner violence:    Fear of current or ex partner: Not on file    Emotionally abused: Not on file    Physically abused: Not on file    Forced sexual activity: Not on file  Other Topics Concern  . Not on file  Social History Narrative   Regular exercise, walking    Outpatient Medications Prior to Visit  Medication Sig Dispense Refill  . amLODipine (NORVASC) 5 MG tablet Take 1 tablet (5 mg total) by mouth daily. 90 tablet 1  . Calcium Carbonate (CALCIUM 600 PO) Take by mouth.    . Cholecalciferol (D-3-5) 5000 UNITS capsule Take 5,000 Units by mouth daily.    . Cyanocobalamin (B-12) 2500 MCG TABS Take 1 tablet by mouth daily.    . fish oil-omega-3 fatty acids 1000 MG capsule Take 1 g by mouth 2 (two) times daily.     . hydrochlorothiazide (HYDRODIURIL) 25 MG tablet Take 1 tablet (25 mg total) by mouth daily. 90 tablet 3  . levothyroxine (SYNTHROID, LEVOTHROID) 125 MCG tablet Take 1 tablet (125 mcg total) by mouth daily. 90 tablet 3  . LIVALO 2 MG TABS Take 1 tablet (2 mg total) by mouth at  bedtime. 90 tablet 3  . metFORMIN (GLUCOPHAGE XR) 500 MG 24 hr tablet Take 1 tablet (500 mg total) by mouth daily with breakfast. 180 tablet 1  . Multiple Vitamin (MULTIVITAMIN) capsule Take 1 capsule by mouth daily.    . timolol (TIMOPTIC) 0.5 % ophthalmic solution     . triamcinolone cream (KENALOG) 0.1 % Apply 1 application topically at bedtime. 15 g 0   No facility-administered medications prior to visit.     Allergies  Allergen Reactions  . Lipitor [Atorvastatin] Other (See Comments)    Myalgia  . Lisinopril     Throat swelling  . Simvastatin Other (See Comments)    Myalgias and joint pain.  Chanda Busing [Pitavastatin] Rash    Rash on face, itchy skin, joint stiffness    ROS     Objective:    Physical Exam  Constitutional: She is oriented to person, place, and time. She appears well-developed and well-nourished.  HENT:  Head: Normocephalic and atraumatic.  Eyes: Conjunctivae and EOM are normal.  Cardiovascular: Normal rate.  Pulmonary/Chest: Effort normal.  Neurological: She is alert and oriented to person, place, and time.  Skin: Skin is dry. No pallor.  Lesion on the left upper chest wall approx 2 cm  has a hyperpigmented and most light brown color but at the base and almost a crescent shape it is maculopapular erythematous with a little bit of a shine and some peeling skin to it.  He also has an oval-shaped skin colored papular dry lesion on her left lower back most consistent with a seborrheic keratosis.  Psychiatric: She has a normal mood and affect. Her behavior is normal.  Vitals reviewed.   BP (!) 147/73 (BP Location: Left Arm)   Pulse 88   Ht 5\' 9"  (1.753 m)   Wt 230 lb (104.3 kg)   SpO2 96%   BMI 33.97 kg/m  Wt Readings from Last 3 Encounters:  10/05/18 230 lb (104.3 kg)  05/26/18 223 lb (101.2 kg)  03/24/18 216 lb (98 kg)    There are no preventive care reminders to display for this patient.  There are no preventive care reminders to display for this  patient.   Lab  Results  Component Value Date   TSH 1.13 11/21/2017   Lab Results  Component Value Date   WBC 6.3 10/11/2015   HGB 13.9 10/11/2015   HCT 42.3 10/11/2015   MCV 89.1 10/11/2015   PLT 328 10/11/2015   Lab Results  Component Value Date   NA 138 05/26/2018   K 4.2 05/26/2018   CO2 27 05/26/2018   GLUCOSE 77 05/26/2018   BUN 14 05/26/2018   CREATININE 0.65 05/26/2018   BILITOT 0.5 11/21/2017   ALKPHOS 75 11/25/2016   AST 16 11/21/2017   ALT 19 11/21/2017   PROT 6.7 11/21/2017   ALBUMIN 4.3 11/25/2016   CALCIUM 9.6 05/26/2018   Lab Results  Component Value Date   CHOL 200 (H) 11/21/2017   Lab Results  Component Value Date   HDL 68 11/21/2017   Lab Results  Component Value Date   LDLCALC 112 (H) 11/21/2017   Lab Results  Component Value Date   TRIG 94 11/21/2017   Lab Results  Component Value Date   CHOLHDL 2.9 11/21/2017   Lab Results  Component Value Date   HGBA1C 6.0 (A) 05/26/2018       Assessment & Plan:   Problem List Items Addressed This Visit    None    Visit Diagnoses    Skin lesion of chest wall    -  Primary   Seborrheic keratosis         Atypical lesion on her chest-because the bottom part of the lesion has an erythematous almost shiny appearance consider could be a basal cell versus an inflamed seborrheic keratosis.  Does not quite have the scale usually seen with a squamous cell.  We discussed treatment options including doing a biopsy.  She said she would prefer a topical treatment and then return for biopsy if not resolving.  Thus will treat with imiquimod topical 5% 5 times a week for 6 weeks.  Then she will let it heal for 2 weeks and then return for me to recheck the lesion.  Seborrheic keratosis-the lesion on her left low back is most consistent with seborrheic keratosis.  Explained that these are often inherited and genetic and not related to sun damage.  Meds ordered this encounter  Medications  . imiquimod (ALDARA)  5 % cream    Sig: 1 application topically 5 times weekly for a total of 6 weeks.    Dispense:  24 each    Refill:  0     Beatrice Lecher, MD

## 2018-10-16 ENCOUNTER — Encounter: Payer: Self-pay | Admitting: Family Medicine

## 2018-10-16 NOTE — Telephone Encounter (Signed)
Please attach blank forms for her to give to her neighbor for medical release.

## 2018-10-19 ENCOUNTER — Encounter: Payer: Self-pay | Admitting: Family Medicine

## 2018-10-19 ENCOUNTER — Other Ambulatory Visit: Payer: Self-pay | Admitting: Family Medicine

## 2018-10-19 DIAGNOSIS — I10 Essential (primary) hypertension: Secondary | ICD-10-CM

## 2018-10-28 ENCOUNTER — Encounter: Payer: Self-pay | Admitting: Family Medicine

## 2018-11-09 ENCOUNTER — Encounter: Payer: Self-pay | Admitting: Family Medicine

## 2018-11-24 ENCOUNTER — Encounter: Payer: Self-pay | Admitting: Family Medicine

## 2018-11-24 ENCOUNTER — Ambulatory Visit (INDEPENDENT_AMBULATORY_CARE_PROVIDER_SITE_OTHER): Payer: 59 | Admitting: Family Medicine

## 2018-11-24 ENCOUNTER — Other Ambulatory Visit: Payer: Self-pay

## 2018-11-24 VITALS — BP 137/74 | HR 70 | Ht 69.0 in | Wt 222.0 lb

## 2018-11-24 DIAGNOSIS — L608 Other nail disorders: Secondary | ICD-10-CM

## 2018-11-24 DIAGNOSIS — R7301 Impaired fasting glucose: Secondary | ICD-10-CM

## 2018-11-24 DIAGNOSIS — E039 Hypothyroidism, unspecified: Secondary | ICD-10-CM | POA: Diagnosis not present

## 2018-11-24 DIAGNOSIS — I10 Essential (primary) hypertension: Secondary | ICD-10-CM | POA: Diagnosis not present

## 2018-11-24 DIAGNOSIS — M79601 Pain in right arm: Secondary | ICD-10-CM | POA: Diagnosis not present

## 2018-11-24 LAB — POCT GLYCOSYLATED HEMOGLOBIN (HGB A1C): Hemoglobin A1C: 6.3 % — AB (ref 4.0–5.6)

## 2018-11-24 MED ORDER — METFORMIN HCL 500 MG PO TABS: 500.0000 mg | ORAL_TABLET | Freq: Two times a day (BID) | ORAL | 3 refills | Status: DC

## 2018-11-24 NOTE — Assessment & Plan Note (Signed)
Well controlled. Continue current regimen. Follow up in  6 months.  

## 2018-11-24 NOTE — Assessment & Plan Note (Signed)
Due to recheck TSH. 

## 2018-11-24 NOTE — Progress Notes (Signed)
Established Patient Office Visit  Subjective:  Patient ID: [name] [name], female    DOB: [birth year]  Age: 63 y.o. MRN: [id number]  CC:  Chief Complaint  Patient presents with  . Hypertension  . ifg    HPI [name] [name] presents for   Hypertension- Pt denies chest pain, SOB, dizziness, or heart palpitations.  Taking meds as directed w/o problems.  Denies medication side effects.   Impaired fasting glucose-no increased thirst or urination. No symptoms consistent with hypoglycemia.  Hypothyroidism - Taking medication regularly in the AM away from food and vitamins, etc. No recent change to skin, hair, or energy levels.  Also complains of right lateral arm pain near the outside of her elbow.  Is been going on for about a week.  She thinks it might be related to her workstation she is right-handed.  No known injury or trauma.  She does take ibuprofen for pain and that does seem to help some.  He also has a pink line going down her pinky nail that has been there for several years she just kept thinking it would grow out but it never did and she would like me to look at today.  Is not bothersome or painful.  Past Medical History:  Diagnosis Date  . HTN (hypertension) 04/04/2010   Qualifier: Diagnosis of  By: Esmeralda Arthur    . Hyperlipidemia   . Hypothyroidism   . Migraine    hx of  . Migraines   . Obesity   . Obesity     Past Surgical History:  Procedure Laterality Date  . ABDOMINAL HYSTERECTOMY  2005   complete  . CATARACT EXTRACTION W/ INTRAOCULAR LENS IMPLANT Left 05/10/14  . dexa  4/07   (+) 1.7  . JOINT REPLACEMENT     L hip replacement 2011  . migraines, resolved  2003  . Right foot surgery  2000   callus removed    Family History  Problem Relation Age of Onset  . Heart attack Mother   . CAD Mother        stents  . Hyperlipidemia Mother   . Other Father 29       stents  . Hypertension Father   . Hyperlipidemia Brother     Social History    Socioeconomic History  . Marital status: Divorced    Spouse name: divorced  . Number of children: Not on file  . Years of education: Not on file  . Highest education level: Not on file  Occupational History    Employer: [employer]  . Financial resource strain: Not on file  . Food insecurity    Worry: Not on file    Inability: Not on file  . Transportation needs    Medical: Not on file    Non-medical: Not on file  Tobacco Use  . Smoking status: Former Smoker    Quit date: 05/20/1994    Years since quitting: 24.5  . Smokeless tobacco: Never Used  Substance and Sexual Activity  . Alcohol use: Yes    Alcohol/week: 3.0 standard drinks    Types: 3 Glasses of wine per week    Comment: 2-3 glasses of wine on weekends  . Drug use: No  . Sexual activity: Not Currently  Lifestyle  . Physical activity    Days per week: Not on file    Minutes per session: Not on file  . Stress: Not on file  Relationships  . Social connections  Talks on phone: Not on file    Gets together: Not on file    Attends religious service: Not on file    Active member of club or organization: Not on file    Attends meetings of clubs or organizations: Not on file    Relationship status: Not on file  . Intimate partner violence    Fear of current or ex partner: Not on file    Emotionally abused: Not on file    Physically abused: Not on file    Forced sexual activity: Not on file  Other Topics Concern  . Not on file  Social History Narrative   Regular exercise, walking    Outpatient Medications Prior to Visit  Medication Sig Dispense Refill  . amLODipine (NORVASC) 5 MG tablet TAKE 1 TABLET DAILY 90 tablet 1  . Calcium Carbonate (CALCIUM 600 PO) Take by mouth.    . Cholecalciferol (D-3-5) 5000 UNITS capsule Take 5,000 Units by mouth daily.    . Cyanocobalamin (B-12) 2500 MCG TABS Take 1 tablet by mouth daily.    . fish oil-omega-3 fatty acids 1000 MG capsule Take 1 g by mouth 2  (two) times daily.     . hydrochlorothiazide (HYDRODIURIL) 25 MG tablet Take 1 tablet (25 mg total) by mouth daily. 90 tablet 3  . levothyroxine (SYNTHROID, LEVOTHROID) 125 MCG tablet Take 1 tablet (125 mcg total) by mouth daily. 90 tablet 3  . LIVALO 2 MG TABS TAKE 1 TABLET AT BEDTIME 90 tablet 3  . Multiple Vitamin (MULTIVITAMIN) capsule Take 1 capsule by mouth daily.    . timolol (TIMOPTIC) 0.5 % ophthalmic solution     . triamcinolone cream (KENALOG) 0.1 % Apply 1 application topically at bedtime. (Patient taking differently: Apply 1 application topically once a week. ) 15 g 0  . metFORMIN (GLUCOPHAGE XR) 500 MG 24 hr tablet Take 1 tablet (500 mg total) by mouth daily with breakfast. 180 tablet 1  . amLODipine (NORVASC) 2.5 MG tablet TAKE 1 TABLET DAILY 90 tablet 2  . imiquimod (ALDARA) 5 % cream 1 application topically 5 times weekly for a total of 6 weeks. 24 each 0   No facility-administered medications prior to visit.     Allergies  Allergen Reactions  . Lipitor [Atorvastatin] Other (See Comments)    Myalgia  . Lisinopril     Throat swelling  . Simvastatin Other (See Comments)    Myalgias and joint pain.  Chanda Busing [Pitavastatin] Rash    Rash on face, itchy skin, joint stiffness    ROS Review of Systems    Objective:    Physical Exam  Constitutional: She is oriented to person, place, and time. She appears well-developed and well-nourished.  HENT:  Head: Normocephalic and atraumatic.  Neck: Neck supple.  Cardiovascular: Normal rate, regular rhythm and normal heart sounds.  Pulmonary/Chest: Effort normal and breath sounds normal.  Musculoskeletal:     Comments: Right elbow and wrist with normal range of motion.  Nontender over the lateral epicondyle but just distal over the tendon and muscle tissue she is tender.  Some pain with supination against resistance over that area.  No significant discomfort with pronation against resistance.  No bruising or swelling.   Neurological: She is alert and oriented to person, place, and time.  Skin: Skin is warm and dry.  Over her pinky nail she does have a vertical groove right down the middle of the nail that is been there for years.  Skin on her  chest appears to be starting to heal looks quite erythematous and it was blistered looking.  Psychiatric: She has a normal mood and affect. Her behavior is normal.    BP 137/74   Pulse 70   Ht 5\' 9"  (1.753 m)   Wt 222 lb (100.7 kg)   SpO2 100%   BMI 32.78 kg/m  Wt Readings from Last 3 Encounters:  11/24/18 222 lb (100.7 kg)  10/05/18 230 lb (104.3 kg)  05/26/18 223 lb (101.2 kg)     There are no preventive care reminders to display for this patient.  There are no preventive care reminders to display for this patient.  Lab Results  Component Value Date   TSH 1.13 11/21/2017   Lab Results  Component Value Date   WBC 6.3 10/11/2015   HGB 13.9 10/11/2015   HCT 42.3 10/11/2015   MCV 89.1 10/11/2015   PLT 328 10/11/2015   Lab Results  Component Value Date   NA 138 05/26/2018   K 4.2 05/26/2018   CO2 27 05/26/2018   GLUCOSE 77 05/26/2018   BUN 14 05/26/2018   CREATININE 0.65 05/26/2018   BILITOT 0.5 11/21/2017   ALKPHOS 75 11/25/2016   AST 16 11/21/2017   ALT 19 11/21/2017   PROT 6.7 11/21/2017   ALBUMIN 4.3 11/25/2016   CALCIUM 9.6 05/26/2018   Lab Results  Component Value Date   CHOL 200 (H) 11/21/2017   Lab Results  Component Value Date   HDL 68 11/21/2017   Lab Results  Component Value Date   LDLCALC 112 (H) 11/21/2017   Lab Results  Component Value Date   TRIG 94 11/21/2017   Lab Results  Component Value Date   CHOLHDL 2.9 11/21/2017   Lab Results  Component Value Date   HGBA1C 6.3 (A) 11/24/2018      Assessment & Plan:   Problem List Items Addressed This Visit      Cardiovascular and Mediastinum   HTN (hypertension) - Primary    Well controlled. Continue current regimen. Follow up in  6 months.        Relevant Orders   COMPLETE METABOLIC PANEL WITH GFR   Lipid panel     Endocrine   IFG (impaired fasting glucose)    A1C is up  A little from last time. Work on exercise and diet. Cut back on fruits. F/U in 6 months. Will change metformin to regular release bc of market withdrawal.       Relevant Orders   POCT glycosylated hemoglobin (Hb A1C) (Completed)   Hypothyroidism    Due to recheck TSH.       Relevant Orders   TSH    Other Visit Diagnoses    Right arm pain       Nail deformity          She has right arm pain just distal to the lateral epicondyle.  She is not tender directly over the lateral epicondyle but her pain is in that distribution.  I did give her exercises to do on her own and recommend icing.  We discussed looking at the position of her mouse and making sure that her elbow is tucked inward when she is using it.  Meds ordered this encounter  Medications  . metFORMIN (GLUCOPHAGE) 500 MG tablet    Sig: Take 1 tablet (500 mg total) by mouth 2 (two) times daily with a meal.    Dispense:  180 tablet    Refill:  3  Follow-up: Return in about 6 months (around 05/27/2019) for Hypertension and glucose.    Beatrice Lecher, MD

## 2018-11-24 NOTE — Assessment & Plan Note (Signed)
A1C is up  A little from last time. Work on exercise and diet. Cut back on fruits. F/U in 6 months. Will change metformin to regular release bc of market withdrawal.

## 2018-11-26 LAB — COMPLETE METABOLIC PANEL WITH GFR
AG Ratio: 1.8 (calc) (ref 1.0–2.5)
ALT: 25 U/L (ref 6–29)
AST: 21 U/L (ref 10–35)
Albumin: 4.2 g/dL (ref 3.6–5.1)
Alkaline phosphatase (APISO): 71 U/L (ref 37–153)
BUN: 17 mg/dL (ref 7–25)
CO2: 29 mmol/L (ref 20–32)
Calcium: 9.5 mg/dL (ref 8.6–10.4)
Chloride: 104 mmol/L (ref 98–110)
Creat: 0.6 mg/dL (ref 0.50–0.99)
GFR, Est African American: 112 mL/min/{1.73_m2} (ref >=60)
GFR, Est Non African American: 97 mL/min/{1.73_m2} (ref >=60)
Globulin: 2.4 g/dL (calc) (ref 1.9–3.7)
Glucose, Bld: 119 mg/dL — ABNORMAL HIGH (ref 65–99)
Potassium: 4 mmol/L (ref 3.5–5.3)
Sodium: 140 mmol/L (ref 135–146)
Total Bilirubin: 0.6 mg/dL (ref 0.2–1.2)
Total Protein: 6.6 g/dL (ref 6.1–8.1)

## 2018-11-26 LAB — LIPID PANEL
Cholesterol: 169 mg/dL (ref <200)
HDL: 53 mg/dL (ref >=50)
LDL Cholesterol (Calc): 95 mg/dL (calc)
Non-HDL Cholesterol (Calc): 116 mg/dL (calc) (ref <130)
Total CHOL/HDL Ratio: 3.2 (calc) (ref <5.0)
Triglycerides: 113 mg/dL (ref <150)

## 2018-11-26 LAB — TSH: TSH: 0.87 mIU/L (ref 0.40–4.50)

## 2019-01-08 ENCOUNTER — Encounter: Payer: Self-pay | Admitting: Family Medicine

## 2019-01-09 ENCOUNTER — Other Ambulatory Visit: Payer: Self-pay | Admitting: Family Medicine

## 2019-01-09 DIAGNOSIS — I10 Essential (primary) hypertension: Secondary | ICD-10-CM

## 2019-01-09 DIAGNOSIS — E039 Hypothyroidism, unspecified: Secondary | ICD-10-CM

## 2019-01-11 NOTE — Telephone Encounter (Signed)
Appointment has been made. No further questions at this time.  

## 2019-01-21 ENCOUNTER — Ambulatory Visit (INDEPENDENT_AMBULATORY_CARE_PROVIDER_SITE_OTHER): Payer: 59 | Admitting: Family Medicine

## 2019-01-21 ENCOUNTER — Encounter: Payer: Self-pay | Admitting: Family Medicine

## 2019-01-21 ENCOUNTER — Other Ambulatory Visit: Payer: Self-pay

## 2019-01-21 VITALS — BP 140/62 | HR 85

## 2019-01-21 DIAGNOSIS — D239 Other benign neoplasm of skin, unspecified: Secondary | ICD-10-CM

## 2019-01-21 NOTE — Patient Instructions (Signed)
Keep wound covered until tomorrow morning.  Okay to remove the bandage.  Okay to get wet in the shower do not scrub the area.  Do not apply any alcohol or peroxide products.  Just apply Vaseline twice a day for 2 weeks.  If any concern in regards to wound healing please let me know.  Okay to use Neosporin for a couple of days initially if desired.

## 2019-01-21 NOTE — Progress Notes (Signed)
Acute Office Visit  Subjective:    Patient ID: [name] [name], female    DOB: 1956/04/28, 63 y.o.   MRN: [id number]  Chief Complaint  Patient presents with  . skin tag    HPI Patient is in today for to have a skin lesion removed from the right upper posterior thigh. She says it has gotten larger over the last year and would like to have it removed.  No pain. It does get easily irritated.    Past Medical History:  Diagnosis Date  . HTN (hypertension) 04/04/2010   Qualifier: Diagnosis of  By: Esmeralda Arthur    . Hyperlipidemia   . Hypothyroidism   . Migraine    hx of  . Migraines   . Obesity   . Obesity     Past Surgical History:  Procedure Laterality Date  . ABDOMINAL HYSTERECTOMY  2005   complete  . CATARACT EXTRACTION W/ INTRAOCULAR LENS IMPLANT Left 05/10/14  . dexa  4/07   (+) 1.7  . JOINT REPLACEMENT     L hip replacement 2011  . migraines, resolved  2003  . Right foot surgery  2000   callus removed    Family History  Problem Relation Age of Onset  . Heart attack Mother   . CAD Mother        stents  . Hyperlipidemia Mother   . Other Father 81       stents  . Hypertension Father   . Hyperlipidemia Brother     Social History   Socioeconomic History  . Marital status: Divorced    Spouse name: divorced  . Number of children: Not on file  . Years of education: Not on file  . Highest education level: Not on file  Occupational History    Employer: [employer]  . Financial resource strain: Not on file  . Food insecurity    Worry: Not on file    Inability: Not on file  . Transportation needs    Medical: Not on file    Non-medical: Not on file  Tobacco Use  . Smoking status: Former Smoker    Quit date: 05/20/1994    Years since quitting: 24.6  . Smokeless tobacco: Never Used  Substance and Sexual Activity  . Alcohol use: Yes    Alcohol/week: 3.0 standard drinks    Types: 3 Glasses of wine per week    Comment: 2-3 glasses  of wine on weekends  . Drug use: No  . Sexual activity: Not Currently  Lifestyle  . Physical activity    Days per week: Not on file    Minutes per session: Not on file  . Stress: Not on file  Relationships  . Social Herbalist on phone: Not on file    Gets together: Not on file    Attends religious service: Not on file    Active member of club or organization: Not on file    Attends meetings of clubs or organizations: Not on file    Relationship status: Not on file  . Intimate partner violence    Fear of current or ex partner: Not on file    Emotionally abused: Not on file    Physically abused: Not on file    Forced sexual activity: Not on file  Other Topics Concern  . Not on file  Social History Narrative   Regular exercise, walking    Outpatient Medications Prior to Visit  Medication  Sig Dispense Refill  . amLODipine (NORVASC) 5 MG tablet TAKE 1 TABLET DAILY 90 tablet 1  . Calcium Carbonate (CALCIUM 600 PO) Take by mouth.    . Cholecalciferol (D-3-5) 5000 UNITS capsule Take 5,000 Units by mouth daily.    . Cyanocobalamin (B-12) 2500 MCG TABS Take 1 tablet by mouth daily.    . fish oil-omega-3 fatty acids 1000 MG capsule Take 1 g by mouth 2 (two) times daily.     . hydrochlorothiazide (HYDRODIURIL) 25 MG tablet TAKE 1 TABLET DAILY 90 tablet 3  . levothyroxine (SYNTHROID) 125 MCG tablet TAKE 1 TABLET DAILY 90 tablet 3  . LIVALO 2 MG TABS TAKE 1 TABLET AT BEDTIME 90 tablet 3  . metFORMIN (GLUCOPHAGE) 500 MG tablet Take 1 tablet (500 mg total) by mouth 2 (two) times daily with a meal. 180 tablet 3  . Multiple Vitamin (MULTIVITAMIN) capsule Take 1 capsule by mouth daily.    . timolol (TIMOPTIC) 0.5 % ophthalmic solution     . triamcinolone cream (KENALOG) 0.1 % Apply 1 application topically at bedtime. (Patient taking differently: Apply 1 application topically once a week. ) 15 g 0   No facility-administered medications prior to visit.     Allergies  Allergen  Reactions  . Lipitor [Atorvastatin] Other (See Comments)    Myalgia  . Lisinopril     Throat swelling  . Simvastatin Other (See Comments)    Myalgias and joint pain.  Chanda Busing [Pitavastatin] Rash    Rash on face, itchy skin, joint stiffness    ROS     Objective:    Physical Exam  Constitutional: She is oriented to person, place, and time. She appears well-developed and well-nourished.  HENT:  Head: Normocephalic and atraumatic.  Eyes: Conjunctivae and EOM are normal.  Cardiovascular: Normal rate.  Pulmonary/Chest: Effort normal.  Neurological: She is alert and oriented to person, place, and time.  Skin: Skin is dry. No pallor.  0.33 x 1 cm soft papular lesion consistant with either a skin tag or dermatofibroma.   Psychiatric: She has a normal mood and affect. Her behavior is normal.  Vitals reviewed.   BP 140/62   Pulse 85  Wt Readings from Last 3 Encounters:  11/24/18 222 lb (100.7 kg)  10/05/18 230 lb (104.3 kg)  05/26/18 223 lb (101.2 kg)    Health Maintenance Due  Topic Date Due  . INFLUENZA VACCINE  12/19/2018       Assessment & Plan:   Problem List Items Addressed This Visit    None    Visit Diagnoses    Dermatofibroma    -  Primary     Excision tolerated well. F/u wound care discussed.    Shave Biopsy Procedure Note  Pre-operative Diagnosis: Dermatofibroma  Post-operative Diagnosis: normal  Locations:right posterior thigh  Indications: irrated and getting larger   Anesthesia: Lidocaine 1% with epinephrine    Procedure Details  Patient informed of the risks (including bleeding and infection) and benefits of the  procedure and Verbal informed consent obtained.  The lesion and surrounding area were given a sterile prep using chlorhexidine and draped in the usual sterile fashion. A scalpel was used to shave an area of skin approximately 1cm by 0.3cm.  Hemostasis achieved with alumuninum chloride. Antibiotic ointment and a sterile dressing  applied.  The specimen was sent for pathologic examination. The patient tolerated the procedure well.  EBL: trace  Findings: Not sent for pathology  Condition: Stable  Complications: none.  Plan:  1. Instructed to keep the wound dry and covered for 24-48h and clean thereafter. 2. Warning signs of infection were reviewed.   3. Recommended that the patient use OTC acetaminophen as needed for pain.  4. Return PRN.   No orders of the defined types were placed in this encounter.    Beatrice Lecher, MD

## 2019-01-22 ENCOUNTER — Ambulatory Visit: Payer: 59 | Admitting: Family Medicine

## 2019-02-09 ENCOUNTER — Encounter: Payer: Self-pay | Admitting: Family Medicine

## 2019-02-23 ENCOUNTER — Encounter: Payer: Self-pay | Admitting: Family Medicine

## 2019-03-17 ENCOUNTER — Telehealth: Payer: Self-pay

## 2019-03-17 NOTE — Telephone Encounter (Signed)
[employer] called and left message stating the [facility] needs a PA or provider needs to change to generic.

## 2019-03-17 NOTE — Telephone Encounter (Signed)
Please initiate prior authorization.  She has already tried atorvastatin, simvastatin, and pravastatin.

## 2019-03-18 NOTE — Telephone Encounter (Signed)
Done - CF °

## 2019-03-24 ENCOUNTER — Encounter: Payer: Self-pay | Admitting: Family Medicine

## 2019-03-31 ENCOUNTER — Other Ambulatory Visit: Payer: Self-pay | Admitting: Family Medicine

## 2019-03-31 DIAGNOSIS — Z1239 Encounter for other screening for malignant neoplasm of breast: Secondary | ICD-10-CM

## 2019-05-08 ENCOUNTER — Encounter: Payer: Self-pay | Admitting: Family Medicine

## 2019-05-10 NOTE — Telephone Encounter (Signed)
Do not see anemia on problem list and also do not see where we have had a recent CBC.  Do you want to go ahead and check blood work?

## 2019-05-19 ENCOUNTER — Ambulatory Visit (INDEPENDENT_AMBULATORY_CARE_PROVIDER_SITE_OTHER): Payer: 59

## 2019-05-19 ENCOUNTER — Other Ambulatory Visit: Payer: Self-pay

## 2019-05-19 DIAGNOSIS — Z1239 Encounter for other screening for malignant neoplasm of breast: Secondary | ICD-10-CM

## 2019-05-22 ENCOUNTER — Other Ambulatory Visit: Payer: Self-pay | Admitting: Family Medicine

## 2019-05-22 DIAGNOSIS — R7301 Impaired fasting glucose: Secondary | ICD-10-CM

## 2019-05-27 ENCOUNTER — Other Ambulatory Visit: Payer: Self-pay

## 2019-05-27 ENCOUNTER — Ambulatory Visit (INDEPENDENT_AMBULATORY_CARE_PROVIDER_SITE_OTHER): Payer: 59 | Admitting: Family Medicine

## 2019-05-27 ENCOUNTER — Encounter: Payer: Self-pay | Admitting: Family Medicine

## 2019-05-27 VITALS — BP 135/59 | HR 91 | Ht 69.0 in | Wt 233.0 lb

## 2019-05-27 DIAGNOSIS — E039 Hypothyroidism, unspecified: Secondary | ICD-10-CM

## 2019-05-27 DIAGNOSIS — I10 Essential (primary) hypertension: Secondary | ICD-10-CM

## 2019-05-27 DIAGNOSIS — R7301 Impaired fasting glucose: Secondary | ICD-10-CM | POA: Diagnosis not present

## 2019-05-27 DIAGNOSIS — Z6834 Body mass index (BMI) 34.0-34.9, adult: Secondary | ICD-10-CM | POA: Diagnosis not present

## 2019-05-27 DIAGNOSIS — Z6833 Body mass index (BMI) 33.0-33.9, adult: Secondary | ICD-10-CM | POA: Insufficient documentation

## 2019-05-27 LAB — POCT GLYCOSYLATED HEMOGLOBIN (HGB A1C): Hemoglobin A1C: 5.8 % — AB (ref 4.0–5.6)

## 2019-05-27 MED ORDER — AMLODIPINE BESYLATE 5 MG PO TABS: 5.0000 mg | ORAL_TABLET | Freq: Every day | ORAL | 1 refills | Status: DC

## 2019-05-27 NOTE — Assessment & Plan Note (Signed)
A1c looks great.  Improved from last time.  Keep up the good work.

## 2019-05-27 NOTE — Assessment & Plan Note (Signed)
Last TSH 6 months ago looked great.  But with recent weight gain we will go ahead and recheck TSH to make sure that her dose does not need to be adjusted.

## 2019-05-27 NOTE — Progress Notes (Signed)
Established Patient Office Visit  Subjective:  Patient ID: [name] [name], female    DOB: 03-14-1956  Age: 64 y.o. MRN: [id number]  CC:  Chief Complaint  Patient presents with  . abnormal glucose  . Hypertension  . Hypothyroidism    HPI [name] [name] presents for   Hypertension- Pt denies chest pain, SOB, dizziness, or heart palpitations.  Taking meds as directed w/o problems.  Denies medication side effects.    Impaired fasting glucose-no increased thirst or urination. No symptoms consistent with hypoglycemia.  She does felt like she has been eating a little better.  Hypothyroidism - Taking medication regularly in the AM away from food and vitamins, etc. No recent change to skin, hair, or energy levels. She has gained some weight and is a little frustrated with that.      Past Medical History:  Diagnosis Date  . HTN (hypertension) 04/04/2010   Qualifier: Diagnosis of  By: Esmeralda Arthur    . Hyperlipidemia   . Hypothyroidism   . Migraine    hx of  . Migraines   . Obesity   . Obesity     Past Surgical History:  Procedure Laterality Date  . ABDOMINAL HYSTERECTOMY  2005   complete  . CATARACT EXTRACTION W/ INTRAOCULAR LENS IMPLANT Left 05/10/14  . dexa  4/07   (+) 1.7  . JOINT REPLACEMENT     L hip replacement 2011  . migraines, resolved  2003  . Right foot surgery  2000   callus removed    Family History  Problem Relation Age of Onset  . Heart attack Mother   . CAD Mother        stents  . Hyperlipidemia Mother   . Other Father 39       stents  . Hypertension Father   . Hyperlipidemia Brother     Social History   Socioeconomic History  . Marital status: Divorced    Spouse name: divorced  . Number of children: Not on file  . Years of education: Not on file  . Highest education level: Not on file  Occupational History    Employer: [employer]  Tobacco Use  . Smoking status: Former Smoker    Quit date: 05/20/1994    Years since quitting:  25.0  . Smokeless tobacco: Never Used  Substance and Sexual Activity  . Alcohol use: Yes    Alcohol/week: 3.0 standard drinks    Types: 3 Glasses of wine per week    Comment: 2-3 glasses of wine on weekends  . Drug use: No  . Sexual activity: Not Currently  Other Topics Concern  . Not on file  Social History Narrative   Regular exercise, walking   Social Determinants of Health   Financial Resource Strain:   . Difficulty of Paying Living Expenses: Not on file  Food Insecurity:   . Worried About Charity fundraiser in the Last Year: Not on file  . Ran Out of Food in the Last Year: Not on file  Transportation Needs:   . Lack of Transportation (Medical): Not on file  . Lack of Transportation (Non-Medical): Not on file  Physical Activity:   . Days of Exercise per Week: Not on file  . Minutes of Exercise per Session: Not on file  Stress:   . Feeling of Stress : Not on file  Social Connections:   . Frequency of Communication with Friends and Family: Not on file  . Frequency of Social Gatherings with  Friends and Family: Not on file  . Attends Religious Services: Not on file  . Active Member of Clubs or Organizations: Not on file  . Attends Archivist Meetings: Not on file  . Marital Status: Not on file  Intimate Partner Violence:   . Fear of Current or Ex-Partner: Not on file  . Emotionally Abused: Not on file  . Physically Abused: Not on file  . Sexually Abused: Not on file    Outpatient Medications Prior to Visit  Medication Sig Dispense Refill  . Calcium Carbonate (CALCIUM 600 PO) Take by mouth.    . Cholecalciferol (D-3-5) 5000 UNITS capsule Take 5,000 Units by mouth daily.    . Cyanocobalamin (B-12) 2500 MCG TABS Take 1 tablet by mouth daily.    . fish oil-omega-3 fatty acids 1000 MG capsule Take 1 g by mouth 2 (two) times daily.     . hydrochlorothiazide (HYDRODIURIL) 25 MG tablet TAKE 1 TABLET DAILY 90 tablet 3  . levothyroxine (SYNTHROID) 125 MCG tablet  TAKE 1 TABLET DAILY 90 tablet 3  . LIVALO 2 MG TABS TAKE 1 TABLET AT BEDTIME 90 tablet 3  . metFORMIN (GLUCOPHAGE) 500 MG tablet Take 1 tablet (500 mg total) by mouth 2 (two) times daily with a meal. 180 tablet 3  . Multiple Vitamin (MULTIVITAMIN) capsule Take 1 capsule by mouth daily.    . timolol (TIMOPTIC) 0.5 % ophthalmic solution     . triamcinolone cream (KENALOG) 0.1 % Apply 1 application topically at bedtime. (Patient taking differently: Apply 1 application topically once a week. ) 15 g 0  . amLODipine (NORVASC) 5 MG tablet TAKE 1 TABLET DAILY 90 tablet 1   No facility-administered medications prior to visit.    Allergies  Allergen Reactions  . Lipitor [Atorvastatin] Other (See Comments)    Myalgia  . Lisinopril     Throat swelling  . Simvastatin Other (See Comments)    Myalgias and joint pain.  Chanda Busing [Pitavastatin] Rash    Rash on face, itchy skin, joint stiffness    ROS Review of Systems    Objective:    Physical Exam  Constitutional: She is oriented to person, place, and time. She appears well-developed and well-nourished.  HENT:  Head: Normocephalic and atraumatic.  Cardiovascular: Normal rate, regular rhythm and normal heart sounds.  Pulmonary/Chest: Effort normal and breath sounds normal.  Neurological: She is alert and oriented to person, place, and time.  Skin: Skin is warm and dry.  Psychiatric: She has a normal mood and affect. Her behavior is normal.    BP (!) 135/59   Pulse 91   Ht 5\' 9"  (1.753 m)   Wt 233 lb (105.7 kg)   SpO2 96%   BMI 34.41 kg/m  Wt Readings from Last 3 Encounters:  05/27/19 233 lb (105.7 kg)  11/24/18 222 lb (100.7 kg)  10/05/18 230 lb (104.3 kg)     There are no preventive care reminders to display for this patient.  There are no preventive care reminders to display for this patient.  Lab Results  Component Value Date   TSH 0.87 11/25/2018   Lab Results  Component Value Date   WBC 6.3 10/11/2015   HGB 13.9  10/11/2015   HCT 42.3 10/11/2015   MCV 89.1 10/11/2015   PLT 328 10/11/2015   Lab Results  Component Value Date   NA 140 11/25/2018   K 4.0 11/25/2018   CO2 29 11/25/2018   GLUCOSE 119 (H) 11/25/2018  BUN 17 11/25/2018   CREATININE 0.60 11/25/2018   BILITOT 0.6 11/25/2018   ALKPHOS 75 11/25/2016   AST 21 11/25/2018   ALT 25 11/25/2018   PROT 6.6 11/25/2018   ALBUMIN 4.3 11/25/2016   CALCIUM 9.5 11/25/2018   Lab Results  Component Value Date   CHOL 169 11/25/2018   Lab Results  Component Value Date   HDL 53 11/25/2018   Lab Results  Component Value Date   LDLCALC 95 11/25/2018   Lab Results  Component Value Date   TRIG 113 11/25/2018   Lab Results  Component Value Date   CHOLHDL 3.2 11/25/2018   Lab Results  Component Value Date   HGBA1C 5.8 (A) 05/27/2019      Assessment & Plan:   Problem List Items Addressed This Visit      Cardiovascular and Mediastinum   HTN (hypertension) - Primary    Well controlled. Continue current regimen. Follow up in  6 mo.       Relevant Medications   amLODipine (NORVASC) 5 MG tablet   Other Relevant Orders   BMP w/ GFR     Endocrine   IFG (impaired fasting glucose)    A1c looks great.  Improved from last time.  Keep up the good work.      Relevant Orders   POCT HgB A1C (Completed)   Hypothyroidism    Last TSH 6 months ago looked great.  But with recent weight gain we will go ahead and recheck TSH to make sure that her dose does not need to be adjusted.      Relevant Orders   TSH     Other   BMI 34.0-34.9,adult    Discussed setting some goals.  Trying to get more active overall.  We also discussed working on calorie goal setting.  She actually has my fitness pal loaded on her phone and has used it previously.  So encouraged her to consider restarting it.  Also encouraged her to consider other more formal plans such as weight watchers.         Meds ordered this encounter  Medications  . amLODipine  (NORVASC) 5 MG tablet    Sig: Take 1 tablet (5 mg total) by mouth daily.    Dispense:  90 tablet    Refill:  1    Follow-up: Return in about 4 months (around 09/24/2019) for Hypertension and Diabetes .    Beatrice Lecher, MD

## 2019-05-27 NOTE — Assessment & Plan Note (Signed)
Discussed setting some goals.  Trying to get more active overall.  We also discussed working on calorie goal setting.  She actually has my fitness pal loaded on her phone and has used it previously.  So encouraged her to consider restarting it.  Also encouraged her to consider other more formal plans such as weight watchers.

## 2019-05-27 NOTE — Assessment & Plan Note (Signed)
Well controlled. Continue current regimen. Follow up in  6 mo  

## 2019-05-28 MED ORDER — AMLODIPINE BESYLATE 5 MG PO TABS: 5.0000 mg | ORAL_TABLET | Freq: Every day | ORAL | 1 refills | Status: DC

## 2019-06-02 LAB — BASIC METABOLIC PANEL WITH GFR
BUN: 18 mg/dL (ref 7–25)
CO2: 27 mmol/L (ref 20–32)
Calcium: 10 mg/dL (ref 8.6–10.4)
Chloride: 103 mmol/L (ref 98–110)
Creat: 0.6 mg/dL (ref 0.50–0.99)
GFR, Est African American: 112 mL/min/{1.73_m2} (ref >=60)
GFR, Est Non African American: 97 mL/min/{1.73_m2} (ref >=60)
Glucose, Bld: 115 mg/dL — ABNORMAL HIGH (ref 65–99)
Potassium: 4.4 mmol/L (ref 3.5–5.3)
Sodium: 139 mmol/L (ref 135–146)

## 2019-06-02 LAB — TSH: TSH: 0.98 mIU/L (ref 0.40–4.50)

## 2019-06-03 ENCOUNTER — Encounter: Payer: Self-pay | Admitting: Family Medicine

## 2019-06-03 NOTE — Progress Notes (Signed)
All labs are normal. 

## 2019-09-17 ENCOUNTER — Other Ambulatory Visit: Payer: Self-pay | Admitting: Family Medicine

## 2019-09-27 ENCOUNTER — Ambulatory Visit: Payer: 59 | Admitting: Family Medicine

## 2019-10-01 ENCOUNTER — Telehealth: Payer: Self-pay | Admitting: Family Medicine

## 2019-10-01 NOTE — Telephone Encounter (Signed)
LVM advising pt of recommendations. Asked that she either rtn call or send a mychart message to let us know how she would like to proceed and to let us know if she has had her COVID vaccine

## 2019-10-01 NOTE — Telephone Encounter (Signed)
Call pt: due for colon cancer screening. What would she like to do? Scope? Cologuard? etc

## 2019-10-04 ENCOUNTER — Ambulatory Visit (INDEPENDENT_AMBULATORY_CARE_PROVIDER_SITE_OTHER): Payer: 59 | Admitting: Family Medicine

## 2019-10-04 ENCOUNTER — Encounter: Payer: Self-pay | Admitting: Family Medicine

## 2019-10-04 VITALS — BP 138/75 | HR 74 | Ht 69.0 in | Wt 227.0 lb

## 2019-10-04 DIAGNOSIS — R7301 Impaired fasting glucose: Secondary | ICD-10-CM | POA: Diagnosis not present

## 2019-10-04 DIAGNOSIS — H401134 Primary open-angle glaucoma, bilateral, indeterminate stage: Secondary | ICD-10-CM | POA: Diagnosis not present

## 2019-10-04 DIAGNOSIS — Z1211 Encounter for screening for malignant neoplasm of colon: Secondary | ICD-10-CM | POA: Diagnosis not present

## 2019-10-04 DIAGNOSIS — E039 Hypothyroidism, unspecified: Secondary | ICD-10-CM | POA: Diagnosis not present

## 2019-10-04 DIAGNOSIS — I10 Essential (primary) hypertension: Secondary | ICD-10-CM

## 2019-10-04 LAB — POCT GLYCOSYLATED HEMOGLOBIN (HGB A1C): Hemoglobin A1C: 6.4 % — AB (ref 4.0–5.6)

## 2019-10-04 MED ORDER — TIMOLOL MALEATE 0.5 % OP SOLN: 1.0000 [drp] | Freq: Two times a day (BID) | OPHTHALMIC | 3 refills | Status: DC

## 2019-10-04 NOTE — Assessment & Plan Note (Signed)
BP borderline today.  Says when she donates blood her BP looks great! Will monitor.

## 2019-10-04 NOTE — Telephone Encounter (Signed)
Pt's HM updated.

## 2019-10-04 NOTE — Assessment & Plan Note (Addendum)
Significantly increased compared to prior.  Now 6.4.  We discussed again this is right on the verge of being diagnosed with diabetes.  We also discussed the possibility of increasing Metformin but she feels motivated to get back on track with her diet.  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

## 2019-10-04 NOTE — Assessment & Plan Note (Signed)
Send her prescription for to cover her new eye doctor.

## 2019-10-04 NOTE — Assessment & Plan Note (Signed)
Last TSH looked great.  Continue current regimen.

## 2019-10-04 NOTE — Progress Notes (Signed)
V    Established Patient Office Visit  Subjective:  Patient ID: [name] [name], female    DOB: Feb 23, 1956  Age: 64 y.o. MRN: [id number]  CC:  Chief Complaint  Patient presents with  . Diabetes  . Hypertension    HPI [name] [name] presents for   Hypertension- Pt denies chest pain, SOB, dizziness, or heart palpitations.  Taking meds as directed w/o problems.  Denies medication side effects.   Impaired fasting glucose-no increased thirst or urination. No symptoms consistent with hypoglycemia.  Still taking her Metformin.  She did ask if I could fill her atenolol for her.  She has a history of glaucoma she is been following regularly with her eye doctor but is actually switching eye doctor since and has been able to get in to light and 1 is notified be willing to refill them for now.  Hypothyroidism - Taking medication regularly in the AM away from food and vitamins, etc. No recent change to skin, hair, or energy levels.    Past Medical History:  Diagnosis Date  . HTN (hypertension) 04/04/2010   Qualifier: Diagnosis of  By: Esmeralda Arthur    . Hyperlipidemia   . Hypothyroidism   . Migraine    hx of  . Migraines   . Obesity   . Obesity     Past Surgical History:  Procedure Laterality Date  . ABDOMINAL HYSTERECTOMY  2005   complete  . CATARACT EXTRACTION W/ INTRAOCULAR LENS IMPLANT Left 05/10/14  . dexa  4/07   (+) 1.7  . JOINT REPLACEMENT     L hip replacement 2011  . migraines, resolved  2003  . Right foot surgery  2000   callus removed    Family History  Problem Relation Age of Onset  . Heart attack Mother   . CAD Mother        stents  . Hyperlipidemia Mother   . Other Father 61       stents  . Hypertension Father   . Hyperlipidemia Brother     Social History   Socioeconomic History  . Marital status: Divorced    Spouse name: divorced  . Number of children: Not on file  . Years of education: Not on file  . Highest education level: Not on file   Occupational History    Employer: [employer]  Tobacco Use  . Smoking status: Former Smoker    Quit date: 05/20/1994    Years since quitting: 25.3  . Smokeless tobacco: Never Used  Substance and Sexual Activity  . Alcohol use: Yes    Alcohol/week: 3.0 standard drinks    Types: 3 Glasses of wine per week    Comment: 2-3 glasses of wine on weekends  . Drug use: No  . Sexual activity: Not Currently  Other Topics Concern  . Not on file  Social History Narrative   Regular exercise, walking   Social Determinants of Health   Financial Resource Strain:   . Difficulty of Paying Living Expenses:   Food Insecurity:   . Worried About Charity fundraiser in the Last Year:   . Arboriculturist in the Last Year:   Transportation Needs:   . Film/video editor (Medical):   Marland Kitchen Lack of Transportation (Non-Medical):   Physical Activity:   . Days of Exercise per Week:   . Minutes of Exercise per Session:   Stress:   . Feeling of Stress :   Social Connections:   . Frequency  of Communication with Friends and Family:   . Frequency of Social Gatherings with Friends and Family:   . Attends Religious Services:   . Active Member of Clubs or Organizations:   . Attends Archivist Meetings:   Marland Kitchen Marital Status:   Intimate Partner Violence:   . Fear of Current or Ex-Partner:   . Emotionally Abused:   Marland Kitchen Physically Abused:   . Sexually Abused:     Outpatient Medications Prior to Visit  Medication Sig Dispense Refill  . amLODipine (NORVASC) 5 MG tablet Take 1 tablet (5 mg total) by mouth daily. 90 tablet 1  . Calcium Carbonate (CALCIUM 600 PO) Take by mouth.    . Cholecalciferol (D-3-5) 5000 UNITS capsule Take 5,000 Units by mouth daily.    . Cyanocobalamin (B-12) 2500 MCG TABS Take 1 tablet by mouth daily.    . fish oil-omega-3 fatty acids 1000 MG capsule Take 1 g by mouth 2 (two) times daily.     . hydrochlorothiazide (HYDRODIURIL) 25 MG tablet TAKE 1 TABLET DAILY 90 tablet 3   . levothyroxine (SYNTHROID) 125 MCG tablet TAKE 1 TABLET DAILY 90 tablet 3  . LIVALO 2 MG TABS TAKE 1 TABLET AT BEDTIME 90 tablet 3  . metFORMIN (GLUCOPHAGE) 500 MG tablet Take 1 tablet (500 mg total) by mouth 2 (two) times daily with a meal. 180 tablet 3  . Multiple Vitamin (MULTIVITAMIN) capsule Take 1 capsule by mouth daily.    Marland Kitchen triamcinolone cream (KENALOG) 0.1 % Apply 1 application topically at bedtime. (Patient taking differently: Apply 1 application topically once a week. ) 15 g 0  . timolol (TIMOPTIC) 0.5 % ophthalmic solution      No facility-administered medications prior to visit.    Allergies  Allergen Reactions  . Lipitor [Atorvastatin] Other (See Comments)    Myalgia  . Lisinopril     Throat swelling  . Simvastatin Other (See Comments)    Myalgias and joint pain.  Chanda Busing [Pitavastatin] Rash    Rash on face, itchy skin, joint stiffness    ROS Review of Systems    Objective:    Physical Exam  Constitutional: She is oriented to person, place, and time. She appears well-developed and well-nourished.  HENT:  Head: Normocephalic and atraumatic.  Cardiovascular: Normal rate, regular rhythm and normal heart sounds.  Pulmonary/Chest: Effort normal and breath sounds normal.  Neurological: She is alert and oriented to person, place, and time.  Skin: Skin is warm and dry.  Psychiatric: She has a normal mood and affect. Her behavior is normal.    BP 138/75   Pulse 74   Ht 5\' 9"  (1.753 m)   Wt 227 lb (103 kg)   SpO2 100%   BMI 33.52 kg/m  Wt Readings from Last 3 Encounters:  10/04/19 227 lb (103 kg)  05/27/19 233 lb (105.7 kg)  11/24/18 222 lb (100.7 kg)     There are no preventive care reminders to display for this patient.  There are no preventive care reminders to display for this patient.  Lab Results  Component Value Date   TSH 0.98 06/02/2019   Lab Results  Component Value Date   WBC 6.3 10/11/2015   HGB 13.9 10/11/2015   HCT 42.3 10/11/2015    MCV 89.1 10/11/2015   PLT 328 10/11/2015   Lab Results  Component Value Date   NA 139 06/02/2019   K 4.4 06/02/2019   CO2 27 06/02/2019   GLUCOSE 115 (H) 06/02/2019  BUN 18 06/02/2019   CREATININE 0.60 06/02/2019   BILITOT 0.6 11/25/2018   ALKPHOS 75 11/25/2016   AST 21 11/25/2018   ALT 25 11/25/2018   PROT 6.6 11/25/2018   ALBUMIN 4.3 11/25/2016   CALCIUM 10.0 06/02/2019   Lab Results  Component Value Date   CHOL 169 11/25/2018   Lab Results  Component Value Date   HDL 53 11/25/2018   Lab Results  Component Value Date   LDLCALC 95 11/25/2018   Lab Results  Component Value Date   TRIG 113 11/25/2018   Lab Results  Component Value Date   CHOLHDL 3.2 11/25/2018   Lab Results  Component Value Date   HGBA1C 6.4 (A) 10/04/2019      Assessment & Plan:   Problem List Items Addressed This Visit      Cardiovascular and Mediastinum   HTN (hypertension) - Primary    BP borderline today.  Says when she donates blood her BP looks great! Will monitor.          Endocrine   IFG (impaired fasting glucose)    Significantly increased compared to prior.  Now 6.4.  We discussed again this is right on the verge of being diagnosed with diabetes.  We also discussed the possibility of increasing Metformin but she feels motivated to get back on track with her diet.  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      Relevant Orders   POCT glycosylated hemoglobin (Hb A1C) (Completed)   Hypothyroidism    Last TSH looked great.  Continue current regimen.        Other   Primary open angle glaucoma of both eyes, indeterminate stage    Send her prescription for to cover her new eye doctor.      Relevant Medications   timolol (TIMOPTIC) 0.5 % ophthalmic solution      Meds ordered this encounter  Medications  . timolol (TIMOPTIC) 0.5 % ophthalmic solution    Sig: Place 1 drop into both eyes 2 (two) times daily.    Dispense:  15 mL    Refill:  3     Follow-up: Return in about 4 months (around 02/04/2020) for Hypertension and prediabetes. Beatrice Lecher, MD

## 2019-10-10 ENCOUNTER — Encounter: Payer: Self-pay | Admitting: Family Medicine

## 2019-10-12 ENCOUNTER — Encounter: Payer: Self-pay | Admitting: Family Medicine

## 2019-10-12 MED ORDER — IMIQUIMOD 5 % EX CREA: TOPICAL_CREAM | CUTANEOUS | 0 refills | Status: DC

## 2019-10-14 LAB — HEMOCCULT GUIAC POC 1CARD (OFFICE)
Card #2 Fecal Occult Blod, POC: NEGATIVE
Card #3 Fecal Occult Blood, POC: NEGATIVE
Fecal Occult Blood, POC: NEGATIVE

## 2019-10-14 NOTE — Addendum Note (Signed)
Addended byAnnamaria Helling on: 10/14/2019 04:28 PM   Modules accepted: Orders

## 2019-11-01 ENCOUNTER — Encounter: Payer: Self-pay | Admitting: Family Medicine

## 2019-11-01 DIAGNOSIS — E782 Mixed hyperlipidemia: Secondary | ICD-10-CM

## 2019-11-01 DIAGNOSIS — T50905A Adverse effect of unspecified drugs, medicaments and biological substances, initial encounter: Secondary | ICD-10-CM

## 2019-11-01 MED ORDER — ROSUVASTATIN CALCIUM 5 MG PO TABS: 5.0000 mg | ORAL_TABLET | Freq: Every day | ORAL | 3 refills | Status: DC

## 2019-11-05 LAB — COMPLETE METABOLIC PANEL WITH GFR
AG Ratio: 1.8 (calc) (ref 1.0–2.5)
ALT: 22 U/L (ref 6–29)
AST: 18 U/L (ref 10–35)
Albumin: 4.6 g/dL (ref 3.6–5.1)
Alkaline phosphatase (APISO): 91 U/L (ref 37–153)
BUN: 21 mg/dL (ref 7–25)
CO2: 25 mmol/L (ref 20–32)
Calcium: 10.1 mg/dL (ref 8.6–10.4)
Chloride: 101 mmol/L (ref 98–110)
Creat: 0.72 mg/dL (ref 0.50–0.99)
GFR, Est African American: 103 mL/min/{1.73_m2} (ref >=60)
GFR, Est Non African American: 89 mL/min/{1.73_m2} (ref >=60)
Globulin: 2.5 g/dL (calc) (ref 1.9–3.7)
Glucose, Bld: 242 mg/dL — ABNORMAL HIGH (ref 65–99)
Potassium: 4 mmol/L (ref 3.5–5.3)
Sodium: 137 mmol/L (ref 135–146)
Total Bilirubin: 0.5 mg/dL (ref 0.2–1.2)
Total Protein: 7.1 g/dL (ref 6.1–8.1)

## 2019-11-07 ENCOUNTER — Encounter: Payer: Self-pay | Admitting: Family Medicine

## 2019-11-09 ENCOUNTER — Other Ambulatory Visit: Payer: Self-pay

## 2019-11-09 DIAGNOSIS — R7301 Impaired fasting glucose: Secondary | ICD-10-CM

## 2019-11-28 ENCOUNTER — Other Ambulatory Visit: Payer: Self-pay | Admitting: Family Medicine

## 2019-11-28 DIAGNOSIS — I10 Essential (primary) hypertension: Secondary | ICD-10-CM

## 2019-11-29 ENCOUNTER — Encounter: Payer: Self-pay | Admitting: Family Medicine

## 2019-11-30 NOTE — Telephone Encounter (Signed)
Please call and schedule appointment with Dr. Madilyn Fireman   Thanks

## 2019-11-30 NOTE — Telephone Encounter (Signed)
Appointment has been made. This has been taken care of.

## 2019-12-17 ENCOUNTER — Ambulatory Visit (INDEPENDENT_AMBULATORY_CARE_PROVIDER_SITE_OTHER): Payer: 59 | Admitting: Family Medicine

## 2019-12-17 ENCOUNTER — Other Ambulatory Visit: Payer: Self-pay

## 2019-12-17 ENCOUNTER — Encounter: Payer: Self-pay | Admitting: Family Medicine

## 2019-12-17 VITALS — BP 136/80 | HR 88 | Ht 69.0 in | Wt 222.0 lb

## 2019-12-17 DIAGNOSIS — I10 Essential (primary) hypertension: Secondary | ICD-10-CM | POA: Diagnosis not present

## 2019-12-17 DIAGNOSIS — E669 Obesity, unspecified: Secondary | ICD-10-CM

## 2019-12-17 DIAGNOSIS — E039 Hypothyroidism, unspecified: Secondary | ICD-10-CM | POA: Diagnosis not present

## 2019-12-17 DIAGNOSIS — N760 Acute vaginitis: Secondary | ICD-10-CM

## 2019-12-17 DIAGNOSIS — L989 Disorder of the skin and subcutaneous tissue, unspecified: Secondary | ICD-10-CM

## 2019-12-17 DIAGNOSIS — S76312A Strain of muscle, fascia and tendon of the posterior muscle group at thigh level, left thigh, initial encounter: Secondary | ICD-10-CM

## 2019-12-17 DIAGNOSIS — R7301 Impaired fasting glucose: Secondary | ICD-10-CM

## 2019-12-17 DIAGNOSIS — R3589 Other polyuria: Secondary | ICD-10-CM

## 2019-12-17 DIAGNOSIS — R358 Other polyuria: Secondary | ICD-10-CM

## 2019-12-17 LAB — WET PREP FOR TRICH, YEAST, CLUE
MICRO NUMBER:: 10770090
Specimen Quality: ADEQUATE

## 2019-12-17 MED ORDER — FLUCONAZOLE 150 MG PO TABS
150.0000 mg | ORAL_TABLET | Freq: Once | ORAL | 0 refills | Status: AC
Start: 2019-12-17 — End: 2019-12-17

## 2019-12-17 NOTE — Patient Instructions (Signed)
Check with your insurance to see if they will cover Saxenda, Wegovy, Qsymia, or Contrave weight loss.

## 2019-12-17 NOTE — Assessment & Plan Note (Signed)
Pressure is a little borderline elevated today.  I had really like to see the systolic less than 485 we will continue to keep an eye on this.  She is due for updated blood work.

## 2019-12-17 NOTE — Assessment & Plan Note (Signed)
Well controlled. Continue current regimen. Follow up in  6 mo  Lab Results  Component Value Date   HGBA1C 6.4 (A) 10/04/2019

## 2019-12-17 NOTE — Progress Notes (Signed)
Established Patient Office Visit  Subjective:  Patient ID: [name] [name], female    DOB: [birth year]  Age: 64 y.o. MRN: [id number]  CC:  Chief Complaint  Patient presents with  . Skin Problem  . Vaginitis      HPI [name] [name] presents for  Follow-up of lesion on her chest.  She finished up with the imiquimod about 2 weeks ago.  Still very inflamed and red.   Left leg pain x 2 weeks.  She does not remember specific injury or trauma but says her hamstring from the back of her knee to the buttocks has been painful and bothersome particular with activities.  She has been icing it.  She has been taking 800 mg ibuprofen up to 3 times a day does help some.  Just does not feel like it is getting a lot better.  She has not noticed any bruising or swelling.  No loss of strength.  She also feels like she might have a yeast infection.  She was treated with a weeks with amoxicillin for dental obstruction for one of her wisdom teeth.  She says she has used 2 different Monistat's and has not gotten any significant relief she says at this point it just feels like it is burning and very irritated.  She denies any abnormal discharge.  Impaired fasting glucose-no increased thirst or urination. No symptoms consistent with hypoglycemia.   He also brought in an article from [employer] she is interested in one of the newer weight loss medications.  Past Medical History:  Diagnosis Date  . HTN (hypertension) 04/04/2010   Qualifier: Diagnosis of  By: Esmeralda Arthur    . Hyperlipidemia   . Hypothyroidism   . Migraine    hx of  . Migraines   . Obesity   . Obesity     Past Surgical History:  Procedure Laterality Date  . ABDOMINAL HYSTERECTOMY  2005   complete  . CATARACT EXTRACTION W/ INTRAOCULAR LENS IMPLANT Left 05/10/14  . dexa  4/07   (+) 1.7  . JOINT REPLACEMENT     L hip replacement 2011  . migraines, resolved  2003  . Right foot surgery  2000   callus removed    Family  History  Problem Relation Age of Onset  . Heart attack Mother   . CAD Mother        stents  . Hyperlipidemia Mother   . Other Father 73       stents  . Hypertension Father   . Hyperlipidemia Brother     Social History   Socioeconomic History  . Marital status: Divorced    Spouse name: divorced  . Number of children: Not on file  . Years of education: Not on file  . Highest education level: Not on file  Occupational History    Employer: [employer]  Tobacco Use  . Smoking status: Former Smoker    Quit date: 05/20/1994    Years since quitting: 25.5  . Smokeless tobacco: Never Used  Substance and Sexual Activity  . Alcohol use: Yes    Alcohol/week: 3.0 standard drinks    Types: 3 Glasses of wine per week    Comment: 2-3 glasses of wine on weekends  . Drug use: No  . Sexual activity: Not Currently  Other Topics Concern  . Not on file  Social History Narrative   Regular exercise, walking   Social Determinants of Health   Financial Resource Strain:   . Difficulty  of Paying Living Expenses:   Food Insecurity:   . Worried About Charity fundraiser in the Last Year:   . Arboriculturist in the Last Year:   Transportation Needs:   . Film/video editor (Medical):   Marland Kitchen Lack of Transportation (Non-Medical):   Physical Activity:   . Days of Exercise per Week:   . Minutes of Exercise per Session:   Stress:   . Feeling of Stress :   Social Connections:   . Frequency of Communication with Friends and Family:   . Frequency of Social Gatherings with Friends and Family:   . Attends Religious Services:   . Active Member of Clubs or Organizations:   . Attends Archivist Meetings:   Marland Kitchen Marital Status:   Intimate Partner Violence:   . Fear of Current or Ex-Partner:   . Emotionally Abused:   Marland Kitchen Physically Abused:   . Sexually Abused:     Outpatient Medications Prior to Visit  Medication Sig Dispense Refill  . amLODipine (NORVASC) 5 MG tablet Take 1 tablet (5  mg total) by mouth daily. 90 tablet 0  . Calcium Carbonate (CALCIUM 600 PO) Take by mouth.    . Cholecalciferol (D-3-5) 5000 UNITS capsule Take 5,000 Units by mouth daily.    . Cyanocobalamin (B-12) 2500 MCG TABS Take 1 tablet by mouth daily.    . fish oil-omega-3 fatty acids 1000 MG capsule Take 1 g by mouth 2 (two) times daily.     . hydrochlorothiazide (HYDRODIURIL) 25 MG tablet TAKE 1 TABLET DAILY 90 tablet 3  . imiquimod (ALDARA) 5 % cream 1 application topically 5 times weekly for a total of 6 weeks. 24 each 0  . levothyroxine (SYNTHROID) 125 MCG tablet TAKE 1 TABLET DAILY 90 tablet 3  . metFORMIN (GLUCOPHAGE) 500 MG tablet Take 1 tablet (500 mg total) by mouth 2 (two) times daily with a meal. 180 tablet 3  . Multiple Vitamin (MULTIVITAMIN) capsule Take 1 capsule by mouth daily.    . rosuvastatin (CRESTOR) 5 MG tablet Take 1 tablet (5 mg total) by mouth at bedtime. 90 tablet 3  . timolol (TIMOPTIC) 0.5 % ophthalmic solution Place 1 drop into both eyes 2 (two) times daily. 15 mL 3  . triamcinolone cream (KENALOG) 0.1 % Apply 1 application topically at bedtime. (Patient taking differently: Apply 1 application topically once a week. ) 15 g 0  . LIVALO 2 MG TABS TAKE 1 TABLET AT BEDTIME 90 tablet 3   No facility-administered medications prior to visit.    Allergies  Allergen Reactions  . Lipitor [Atorvastatin] Other (See Comments)    Myalgia  . Lisinopril     Throat swelling  . Simvastatin Other (See Comments)    Myalgias and joint pain.  Chanda Busing [Pitavastatin] Rash    Rash on face, itchy skin, joint stiffness    ROS Review of Systems    Objective:    Physical Exam Constitutional:      Appearance: She is well-developed.  HENT:     Head: Normocephalic and atraumatic.  Cardiovascular:     Rate and Rhythm: Normal rate and regular rhythm.     Heart sounds: Normal heart sounds.  Pulmonary:     Effort: Pulmonary effort is normal.     Breath sounds: Normal breath sounds.   Musculoskeletal:     Comments: Hip knee and ankle strength is 5 out of 5.  Skin:    General: Skin is warm and dry.  Comments: Right upper chest wall with significant erythema where she had been previously putting the cream.  In the center she has an approximately 1-1/2 to 2 cm area that just appears a little bit thickened but no breaks or cracks in the skin.  Neurological:     Mental Status: She is alert and oriented to person, place, and time.  Psychiatric:        Behavior: Behavior normal.     BP (!) 136/80   Pulse 88   Ht 5\' 9"  (1.753 m)   Wt (!) 222 lb (100.7 kg)   SpO2 97%   BMI 32.78 kg/m  Wt Readings from Last 3 Encounters:  12/17/19 (!) 222 lb (100.7 kg)  10/04/19 227 lb (103 kg)  05/27/19 233 lb (105.7 kg)     There are no preventive care reminders to display for this patient.  There are no preventive care reminders to display for this patient.  Lab Results  Component Value Date   TSH 0.98 06/02/2019   Lab Results  Component Value Date   WBC 6.3 10/11/2015   HGB 13.9 10/11/2015   HCT 42.3 10/11/2015   MCV 89.1 10/11/2015   PLT 328 10/11/2015   Lab Results  Component Value Date   NA 137 11/05/2019   K 4.0 11/05/2019   CO2 25 11/05/2019   GLUCOSE 242 (H) 11/05/2019   BUN 21 11/05/2019   CREATININE 0.72 11/05/2019   BILITOT 0.5 11/05/2019   ALKPHOS 75 11/25/2016   AST 18 11/05/2019   ALT 22 11/05/2019   PROT 7.1 11/05/2019   ALBUMIN 4.3 11/25/2016   CALCIUM 10.1 11/05/2019   Lab Results  Component Value Date   CHOL 169 11/25/2018   Lab Results  Component Value Date   HDL 53 11/25/2018   Lab Results  Component Value Date   LDLCALC 95 11/25/2018   Lab Results  Component Value Date   TRIG 113 11/25/2018   Lab Results  Component Value Date   CHOLHDL 3.2 11/25/2018   Lab Results  Component Value Date   HGBA1C 6.4 (A) 10/04/2019      Assessment & Plan:   Problem List Items Addressed This Visit      Cardiovascular and  Mediastinum   HTN (hypertension)    Pressure is a little borderline elevated today.  I had really like to see the systolic less than 235 we will continue to keep an eye on this.  She is due for updated blood work.      Relevant Orders   CBC   COMPLETE METABOLIC PANEL WITH GFR   Lipid panel   Hemoglobin A1c   TSH     Endocrine   IFG (impaired fasting glucose)    Well controlled. Continue current regimen. Follow up in  6 mo  Lab Results  Component Value Date   HGBA1C 6.4 (A) 10/04/2019         Relevant Orders   CBC   COMPLETE METABOLIC PANEL WITH GFR   Lipid panel   Hemoglobin A1c   TSH   Hypothyroidism   Relevant Orders   TSH    Other Visit Diagnoses    Acute vaginitis    -  Primary   Relevant Orders   CBC   COMPLETE METABOLIC PANEL WITH GFR   Lipid panel   Hemoglobin A1c   TSH   WET PREP FOR [place], YEAST, CLUE (Completed)   Strain of left hamstring muscle, initial encounter  Skin lesion of chest wall       Obesity, Class I, BMI 30-34.9          Acute vaginitis-wet prep performed will call with results.  Since it is Friday and then go ahead and call in a prescription for Diflucan to be sent to the pharmacy will just rule out whether or not she may also have concomitant BV.  Skin lesion of the chest wall.-I like to see her back in about a month once a lot of the erythema has calm down so that we can see if there is anything that still needs to be biopsied.  There is still a little bit of thickness of the skin in the middle but again not sure if that is just from inflammation or not.  Left hamstring strain.  She has had symptoms for couple weeks at this point.  Given handout to do some stretches on her own at home.  Okay to continue with anti-inflammatory she is really at the max dose encouraged her not to take more than that if she really could damage her kidneys.  If is not improving over the next week then please let us know and we will get her in with sports  med for further work-up.  32-she is also interested in one of the newer weight loss medications.  We did discuss these in detail today and encouraged her to check with her insurance to see if they are covered.  She really feels like some of her other issues would improve significantly if she were able to get some of the weight off.  She will let me know  Meds ordered this encounter  Medications  . fluconazole (DIFLUCAN) 150 MG tablet    Sig: Take 1 tablet (150 mg total) by mouth once for 1 dose. Take additional 3 days later if symptoms persist    Dispense:  2 tablet    Refill:  0    Follow-up: Return in about 4 weeks (around 01/14/2020) for recheck skin lesion on chest.    Beatrice Lecher, MD

## 2019-12-18 LAB — COMPLETE METABOLIC PANEL WITH GFR
AG Ratio: 1.7 (calc) (ref 1.0–2.5)
ALT: 19 U/L (ref 6–29)
AST: 18 U/L (ref 10–35)
Albumin: 4.5 g/dL (ref 3.6–5.1)
Alkaline phosphatase (APISO): 72 U/L (ref 37–153)
BUN/Creatinine Ratio: 39 (calc) — ABNORMAL HIGH (ref 6–22)
BUN: 31 mg/dL — ABNORMAL HIGH (ref 7–25)
CO2: 24 mmol/L (ref 20–32)
Calcium: 10 mg/dL (ref 8.6–10.4)
Chloride: 102 mmol/L (ref 98–110)
Creat: 0.79 mg/dL (ref 0.50–0.99)
GFR, Est African American: 92 mL/min/{1.73_m2} (ref >=60)
GFR, Est Non African American: 79 mL/min/{1.73_m2} (ref >=60)
Globulin: 2.6 g/dL (calc) (ref 1.9–3.7)
Glucose, Bld: 102 mg/dL — ABNORMAL HIGH (ref 65–99)
Potassium: 3.9 mmol/L (ref 3.5–5.3)
Sodium: 138 mmol/L (ref 135–146)
Total Bilirubin: 0.5 mg/dL (ref 0.2–1.2)
Total Protein: 7.1 g/dL (ref 6.1–8.1)

## 2019-12-18 LAB — CBC
HCT: 39.9 % (ref 35.0–45.0)
Hemoglobin: 13.2 g/dL (ref 11.7–15.5)
MCH: 29.5 pg (ref 27.0–33.0)
MCHC: 33.1 g/dL (ref 32.0–36.0)
MCV: 89.3 fL (ref 80.0–100.0)
MPV: 10.2 fL (ref 7.5–12.5)
Platelets: 388 10*3/uL (ref 140–400)
RBC: 4.47 10*6/uL (ref 3.80–5.10)
RDW: 14.4 % (ref 11.0–15.0)
WBC: 6.8 10*3/uL (ref 3.8–10.8)

## 2019-12-18 LAB — TSH: TSH: 1.48 mIU/L (ref 0.40–4.50)

## 2019-12-18 LAB — LIPID PANEL
Cholesterol: 255 mg/dL — ABNORMAL HIGH (ref <200)
HDL: 60 mg/dL (ref >=50)
LDL Cholesterol (Calc): 165 mg/dL (calc) — ABNORMAL HIGH
Non-HDL Cholesterol (Calc): 195 mg/dL (calc) — ABNORMAL HIGH (ref <130)
Total CHOL/HDL Ratio: 4.3 (calc) (ref <5.0)
Triglycerides: 156 mg/dL — ABNORMAL HIGH (ref <150)

## 2019-12-18 LAB — HEMOGLOBIN A1C
Hgb A1c MFr Bld: 6.3 % of total Hgb — ABNORMAL HIGH (ref <5.7)
Mean Plasma Glucose: 134 (calc)
eAG (mmol/L): 7.4 (calc)

## 2019-12-21 ENCOUNTER — Encounter: Payer: Self-pay | Admitting: Family Medicine

## 2019-12-26 LAB — URINE CULTURE
MICRO NUMBER:: 10798968
SPECIMEN QUALITY:: ADEQUATE

## 2019-12-26 LAB — URINALYSIS
Bilirubin Urine: NEGATIVE
Glucose, UA: NEGATIVE
Hgb urine dipstick: NEGATIVE
Ketones, ur: NEGATIVE
Nitrite: NEGATIVE
Protein, ur: NEGATIVE
Specific Gravity, Urine: 1.024 (ref 1.001–1.03)
pH: 5.5 (ref 5.0–8.0)

## 2020-01-05 ENCOUNTER — Other Ambulatory Visit: Payer: Self-pay | Admitting: Family Medicine

## 2020-01-05 DIAGNOSIS — E039 Hypothyroidism, unspecified: Secondary | ICD-10-CM

## 2020-01-05 DIAGNOSIS — I10 Essential (primary) hypertension: Secondary | ICD-10-CM

## 2020-01-14 ENCOUNTER — Other Ambulatory Visit: Payer: Self-pay | Admitting: Family Medicine

## 2020-01-14 ENCOUNTER — Ambulatory Visit (INDEPENDENT_AMBULATORY_CARE_PROVIDER_SITE_OTHER): Payer: 59 | Admitting: Family Medicine

## 2020-01-14 ENCOUNTER — Encounter: Payer: Self-pay | Admitting: Family Medicine

## 2020-01-14 VITALS — BP 133/60 | HR 85 | Ht 69.0 in | Wt 224.0 lb

## 2020-01-14 DIAGNOSIS — L989 Disorder of the skin and subcutaneous tissue, unspecified: Secondary | ICD-10-CM | POA: Diagnosis not present

## 2020-01-14 NOTE — Patient Instructions (Signed)
Instructed to keep the wound dry and covered for 24-48h and clean thereafter. Do not scrub at the wound. Warning signs of infection were reviewed. Recommended that the patient use OTC acetaminophen as needed for pain.

## 2020-01-14 NOTE — Progress Notes (Signed)
Established Patient Office Visit  Subjective:  Patient ID: [name] [name], female    DOB: June 13, 1955  Age: 64 y.o. MRN: [id number]  CC:  Chief Complaint  Patient presents with  . Follow-up    HPI [name] [name] presents for follow-up skin lesion on upper anterior chest.  She had a fair amount of erythema with the topical treatment of the commode.  She had 2 lesions she was treating the first 1 she said responded much more quickly and the erythema resolved but on this when it is taken a lot longer and it still is quite irritated and erythematous.  Past Medical History:  Diagnosis Date  . HTN (hypertension) 04/04/2010   Qualifier: Diagnosis of  By: Esmeralda Arthur    . Hyperlipidemia   . Hypothyroidism   . Migraine    hx of  . Migraines   . Obesity   . Obesity     Past Surgical History:  Procedure Laterality Date  . ABDOMINAL HYSTERECTOMY  2005   complete  . CATARACT EXTRACTION W/ INTRAOCULAR LENS IMPLANT Left 05/10/14  . dexa  4/07   (+) 1.7  . JOINT REPLACEMENT     L hip replacement 2011  . migraines, resolved  2003  . Right foot surgery  2000   callus removed    Family History  Problem Relation Age of Onset  . Heart attack Mother   . CAD Mother        stents  . Hyperlipidemia Mother   . Other Father 56       stents  . Hypertension Father   . Hyperlipidemia Brother     Social History   Socioeconomic History  . Marital status: Divorced    Spouse name: divorced  . Number of children: Not on file  . Years of education: Not on file  . Highest education level: Not on file  Occupational History    Employer: [employer]  Tobacco Use  . Smoking status: Former Smoker    Quit date: 05/20/1994    Years since quitting: 25.6  . Smokeless tobacco: Never Used  Substance and Sexual Activity  . Alcohol use: Yes    Alcohol/week: 3.0 standard drinks    Types: 3 Glasses of wine per week    Comment: 2-3 glasses of wine on weekends  . Drug use: No  . Sexual  activity: Not Currently  Other Topics Concern  . Not on file  Social History Narrative   Regular exercise, walking   Social Determinants of Health   Financial Resource Strain:   . Difficulty of Paying Living Expenses: Not on file  Food Insecurity:   . Worried About Charity fundraiser in the Last Year: Not on file  . Ran Out of Food in the Last Year: Not on file  Transportation Needs:   . Lack of Transportation (Medical): Not on file  . Lack of Transportation (Non-Medical): Not on file  Physical Activity:   . Days of Exercise per Week: Not on file  . Minutes of Exercise per Session: Not on file  Stress:   . Feeling of Stress : Not on file  Social Connections:   . Frequency of Communication with Friends and Family: Not on file  . Frequency of Social Gatherings with Friends and Family: Not on file  . Attends Religious Services: Not on file  . Active Member of Clubs or Organizations: Not on file  . Attends Archivist Meetings: Not on file  .  Marital Status: Not on file  Intimate Partner Violence:   . Fear of Current or Ex-Partner: Not on file  . Emotionally Abused: Not on file  . Physically Abused: Not on file  . Sexually Abused: Not on file    Outpatient Medications Prior to Visit  Medication Sig Dispense Refill  . amLODipine (NORVASC) 5 MG tablet Take 1 tablet (5 mg total) by mouth daily. 90 tablet 0  . Calcium Carbonate (CALCIUM 600 PO) Take by mouth.    . Cholecalciferol (D-3-5) 5000 UNITS capsule Take 5,000 Units by mouth daily.    . Cyanocobalamin (B-12) 2500 MCG TABS Take 1 tablet by mouth daily.    . fish oil-omega-3 fatty acids 1000 MG capsule Take 1 g by mouth 2 (two) times daily.     . hydrochlorothiazide (HYDRODIURIL) 25 MG tablet TAKE 1 TABLET DAILY 90 tablet 3  . imiquimod (ALDARA) 5 % cream 1 application topically 5 times weekly for a total of 6 weeks. 24 each 0  . levothyroxine (SYNTHROID) 125 MCG tablet TAKE 1 TABLET DAILY 90 tablet 3  . metFORMIN  (GLUCOPHAGE) 500 MG tablet Take 1 tablet (500 mg total) by mouth 2 (two) times daily with a meal. 180 tablet 3  . Multiple Vitamin (MULTIVITAMIN) capsule Take 1 capsule by mouth daily.    . rosuvastatin (CRESTOR) 5 MG tablet Take 1 tablet (5 mg total) by mouth at bedtime. 90 tablet 3  . timolol (TIMOPTIC) 0.5 % ophthalmic solution Place 1 drop into both eyes 2 (two) times daily. 15 mL 3  . triamcinolone cream (KENALOG) 0.1 % Apply 1 application topically at bedtime. (Patient taking differently: Apply 1 application topically once a week. ) 15 g 0   No facility-administered medications prior to visit.    Allergies  Allergen Reactions  . Lipitor [Atorvastatin] Other (See Comments)    Myalgia  . Lisinopril     Throat swelling  . Simvastatin Other (See Comments)    Myalgias and joint pain.  Chanda Busing [Pitavastatin] Rash    Rash on face, itchy skin, joint stiffness    ROS Review of Systems    Objective:    Physical Exam  BP 133/60   Pulse 85   Ht 5\' 9"  (1.753 m)   Wt 224 lb (101.6 kg)   SpO2 97%   BMI 33.08 kg/m  Wt Readings from Last 3 Encounters:  01/14/20 224 lb (101.6 kg)  12/17/19 (!) 222 lb (100.7 kg)  10/04/19 227 lb (103 kg)     Health Maintenance Due  Topic Date Due  . COLONOSCOPY  08/16/2019    There are no preventive care reminders to display for this patient.  Lab Results  Component Value Date   TSH 1.48 12/17/2019   Lab Results  Component Value Date   WBC 6.8 12/17/2019   HGB 13.2 12/17/2019   HCT 39.9 12/17/2019   MCV 89.3 12/17/2019   PLT 388 12/17/2019   Lab Results  Component Value Date   NA 138 12/17/2019   K 3.9 12/17/2019   CO2 24 12/17/2019   GLUCOSE 102 (H) 12/17/2019   BUN 31 (H) 12/17/2019   CREATININE 0.79 12/17/2019   BILITOT 0.5 12/17/2019   ALKPHOS 75 11/25/2016   AST 18 12/17/2019   ALT 19 12/17/2019   PROT 7.1 12/17/2019   ALBUMIN 4.3 11/25/2016   CALCIUM 10.0 12/17/2019   Lab Results  Component Value Date   CHOL  255 (H) 12/17/2019   Lab Results  Component  Value Date   HDL 60 12/17/2019   Lab Results  Component Value Date   LDLCALC 165 (H) 12/17/2019   Lab Results  Component Value Date   TRIG 156 (H) 12/17/2019   Lab Results  Component Value Date   CHOLHDL 4.3 12/17/2019   Lab Results  Component Value Date   HGBA1C 6.3 (H) 12/17/2019      Assessment & Plan:   Problem List Items Addressed This Visit    None    Visit Diagnoses    Skin lesion of chest wall    -  Primary   Relevant Orders   Surgical pathology     Skin lesion that seems to not be healing though she still has significant erythema right around the lesions it does make me wonder if she is actually still having an immune reaction to it triggered by the medical mode but it is unclear at this point its been there for several months after treatment so I will go ahead and do a shave biopsy just to rule out any other underlying worrisome skin cancers.  Patient tolerated procedure well.  Will call with results as soon as available.  Follow-up wound care discussed.  No orders of the defined types were placed in this encounter.  Shave Biopsy Procedure Note  Pre-operative Diagnosis: Suspicious lesion  Post-operative Diagnosis: same  Locations:upper anterior chest  Indications: not healing  Anesthesia: Lidocaine 1% with epinephrine without added sodium bicarbonate  Procedure Details  Patient informed of the risks (including bleeding and infection) and benefits of the  procedure and Verbal informed consent obtained.  The lesion and surrounding area were given a sterile prep using chlorhexidine and draped in the usual sterile fashion. A scalpel was used to shave an area of skin approximately 1cm by 1cm.  Hemostasis achieved with alumuninum chloride. Antibiotic ointment and a sterile dressing applied.  The specimen was sent for pathologic examination. The patient tolerated the procedure well.  EBL: trace    Findings: Await pathology  Condition: Stable  Complications: none.  Plan: 1. Instructed to keep the wound dry and covered for 24-48h and clean thereafter. 2. Warning signs of infection were reviewed.   3. Recommended that the patient use OTC acetaminophen as needed for pain.  4. Return in PRN.   Follow-up: Return if symptoms worsen or fail to improve.    Beatrice Lecher, MD

## 2020-01-17 ENCOUNTER — Encounter: Payer: Self-pay | Admitting: Family Medicine

## 2020-01-31 ENCOUNTER — Ambulatory Visit: Payer: 59 | Admitting: Family Medicine

## 2020-02-14 ENCOUNTER — Encounter: Payer: Self-pay | Admitting: Family Medicine

## 2020-02-14 NOTE — Telephone Encounter (Signed)
She is welcome to do an E-visit through [employer]. They usually respond in the hour or a virtual visit with me or other provider thsi afternoon

## 2020-02-21 ENCOUNTER — Other Ambulatory Visit: Payer: Self-pay | Admitting: Family Medicine

## 2020-02-21 DIAGNOSIS — I10 Essential (primary) hypertension: Secondary | ICD-10-CM

## 2020-02-22 ENCOUNTER — Telehealth (INDEPENDENT_AMBULATORY_CARE_PROVIDER_SITE_OTHER): Payer: Managed Care, Other (non HMO) | Admitting: Family Medicine

## 2020-02-22 VITALS — Wt 218.0 lb

## 2020-02-22 DIAGNOSIS — R3915 Urgency of urination: Secondary | ICD-10-CM | POA: Diagnosis not present

## 2020-02-22 DIAGNOSIS — Z6832 Body mass index (BMI) 32.0-32.9, adult: Secondary | ICD-10-CM

## 2020-02-22 DIAGNOSIS — R350 Frequency of micturition: Secondary | ICD-10-CM

## 2020-02-22 MED ORDER — SULFAMETHOXAZOLE-TRIMETHOPRIM 800-160 MG PO TABS: 1.0000 | ORAL_TABLET | Freq: Two times a day (BID) | ORAL | 0 refills | Status: DC

## 2020-02-22 NOTE — Progress Notes (Signed)
Virtual Visit via Telephone Note  I connected with [name] [name] on 02/22/20 at 10:10 AM EDT by telephone and verified that I am speaking with the correct person using two identifiers.   I discussed the limitations, risks, security and privacy concerns of performing an evaluation and management service by telephone and the availability of in person appointments. I also discussed with the patient that there may be a patient responsible charge related to this service. The patient expressed understanding and agreed to proceed.  Patient location: at home.   Provider loccation: In office   Subjective:    CC: Possible bladder infection.  HPI: 64 year old female reports that for about 2 weeks she has had some urinary urgency and frequency.  No odor to the urine.  No vaginal discharge.  No fever, chills or sweats.  He had similar symptoms earlier this year and we did do a urinalysis and culture back in August.  The culture was negative at that time so we did not treat.  The symptoms gradually got better but now they are back again.  She has not had any back pain or blood in the urine.  Is also been doing my fitness pal with setting a goal of losing a pound a week.  She is already down to 218 pounds which is fantastic.  She says ultimately she wants to lose about 60 pounds she really wants to get off of blood pressure medication and cholesterol medication she says ultimately that is her goal.   Past medical history, Surgical history, Family history not pertinant except as noted below, Social history, Allergies, and medications have been entered into the medical record, reviewed, and corrections made.   Review of Systems: No fevers, chills, night sweats, weight loss, chest pain, or shortness of breath.   Objective:    General: Speaking clearly in complete sentences without any shortness of breath.  Alert and oriented x3.  Normal judgment. No apparent acute distress.    Impression and  Recommendations:    Urinary frequency/urgency-symptoms seem to be coming and going-we will go ahead and treat today for possible UTI.  No red flag symptoms on history.  If not improved then recommend further work-up with repeat urinalysis and culture though she just had one about 2 months ago and also recommend bladder ultrasound at that point.  She says it just feels like a constant pressure consider urology referral as well consider other diagnoses such as interstitial cystitis.  BMI 32/abnormal weight gain-so far she is actually doing really well with my fitness pal and she is down about 6 pounds but ultimately her goal is to lose about 60 and to be able to come off of several of her medications.   She is getting the flu vaccine next week at work      I discussed the assessment and treatment plan with the patient. The patient was provided an opportunity to ask questions and all were answered. The patient agreed with the plan and demonstrated an understanding of the instructions.   The patient was advised to call back or seek an in-person evaluation if the symptoms worsen or if the condition fails to improve as anticipated.  I provided 21 minutes of non-face-to-face time during this encounter.   Beatrice Lecher, MD

## 2020-02-24 LAB — HM DIABETES EYE EXAM

## 2020-03-05 ENCOUNTER — Other Ambulatory Visit: Payer: Self-pay | Admitting: Family Medicine

## 2020-03-05 ENCOUNTER — Encounter: Payer: Self-pay | Admitting: Family Medicine

## 2020-03-06 ENCOUNTER — Other Ambulatory Visit: Payer: Self-pay | Admitting: *Deleted

## 2020-03-06 MED ORDER — METFORMIN HCL 500 MG PO TABS
500.0000 mg | ORAL_TABLET | Freq: Two times a day (BID) | ORAL | 3 refills | Status: DC
Start: 2020-03-06 — End: 2020-07-18

## 2020-03-11 ENCOUNTER — Encounter: Payer: Self-pay | Admitting: Family Medicine

## 2020-04-20 ENCOUNTER — Other Ambulatory Visit: Payer: Self-pay | Admitting: Family Medicine

## 2020-04-20 DIAGNOSIS — I10 Essential (primary) hypertension: Secondary | ICD-10-CM

## 2020-05-02 ENCOUNTER — Encounter: Payer: Self-pay | Admitting: Family Medicine

## 2020-05-22 ENCOUNTER — Encounter: Payer: Self-pay | Admitting: Family Medicine

## 2020-05-22 NOTE — Telephone Encounter (Signed)
Scanning in and keying in

## 2020-05-29 ENCOUNTER — Encounter: Payer: Self-pay | Admitting: Family Medicine

## 2020-05-30 ENCOUNTER — Other Ambulatory Visit: Payer: Self-pay | Admitting: *Deleted

## 2020-05-30 MED ORDER — TRIAMCINOLONE ACETONIDE 0.1 % EX CREA
1.0000 | TOPICAL_CREAM | Freq: Every day | CUTANEOUS | 0 refills | Status: DC
Start: 2020-05-30 — End: 2022-10-15

## 2020-06-19 ENCOUNTER — Encounter: Payer: Self-pay | Admitting: Family Medicine

## 2020-06-21 ENCOUNTER — Other Ambulatory Visit: Payer: Self-pay | Admitting: Family Medicine

## 2020-06-21 DIAGNOSIS — Z1231 Encounter for screening mammogram for malignant neoplasm of breast: Secondary | ICD-10-CM

## 2020-07-07 ENCOUNTER — Encounter: Payer: Self-pay | Admitting: Family Medicine

## 2020-07-16 ENCOUNTER — Encounter: Payer: Self-pay | Admitting: Family Medicine

## 2020-07-18 ENCOUNTER — Other Ambulatory Visit: Payer: Self-pay | Admitting: *Deleted

## 2020-07-18 DIAGNOSIS — R7301 Impaired fasting glucose: Secondary | ICD-10-CM

## 2020-07-18 MED ORDER — METFORMIN HCL 500 MG PO TABS: 500.0000 mg | ORAL_TABLET | Freq: Two times a day (BID) | ORAL | 3 refills | Status: DC

## 2020-07-20 ENCOUNTER — Ambulatory Visit: Payer: 59

## 2020-07-27 ENCOUNTER — Other Ambulatory Visit: Payer: Self-pay

## 2020-07-27 ENCOUNTER — Ambulatory Visit (INDEPENDENT_AMBULATORY_CARE_PROVIDER_SITE_OTHER): Payer: 59

## 2020-07-27 DIAGNOSIS — Z1231 Encounter for screening mammogram for malignant neoplasm of breast: Secondary | ICD-10-CM | POA: Diagnosis not present

## 2020-07-30 ENCOUNTER — Encounter: Payer: Self-pay | Admitting: Family Medicine

## 2020-08-13 ENCOUNTER — Encounter: Payer: Self-pay | Admitting: Family Medicine

## 2020-08-14 ENCOUNTER — Other Ambulatory Visit: Payer: Self-pay | Admitting: *Deleted

## 2020-08-14 DIAGNOSIS — E782 Mixed hyperlipidemia: Secondary | ICD-10-CM

## 2020-08-14 MED ORDER — ROSUVASTATIN CALCIUM 5 MG PO TABS: 5.0000 mg | ORAL_TABLET | Freq: Every day | ORAL | 3 refills | Status: DC

## 2020-08-26 LAB — PROTIME-INR: INR: 1 (ref 0.9–1.1)

## 2020-08-28 ENCOUNTER — Ambulatory Visit: Payer: 59 | Admitting: Physician Assistant

## 2020-08-29 ENCOUNTER — Encounter: Payer: Self-pay | Admitting: Family Medicine

## 2020-08-30 ENCOUNTER — Encounter: Payer: Self-pay | Admitting: Physician Assistant

## 2020-08-30 ENCOUNTER — Telehealth: Payer: Self-pay | Admitting: Family Medicine

## 2020-08-30 NOTE — Telephone Encounter (Signed)
Patient wanted to do a preop for surgery over the phone. States that there is no way she can come in the office. She can not get a ride but she said she can do it over the phone. Please advise if this can be done for patient.

## 2020-08-30 NOTE — Telephone Encounter (Signed)
She has to come in for an appointment. She was sent a MyChart message 3 weeks ago advising she will need to come in for an appointment.

## 2020-08-31 ENCOUNTER — Other Ambulatory Visit: Payer: Self-pay

## 2020-08-31 ENCOUNTER — Encounter: Payer: Self-pay | Admitting: Physician Assistant

## 2020-08-31 ENCOUNTER — Ambulatory Visit (INDEPENDENT_AMBULATORY_CARE_PROVIDER_SITE_OTHER): Payer: 59 | Admitting: Physician Assistant

## 2020-08-31 VITALS — BP 118/59 | HR 82 | Temp 98.1°F | Wt 205.0 lb

## 2020-08-31 DIAGNOSIS — M1611 Unilateral primary osteoarthritis, right hip: Secondary | ICD-10-CM | POA: Insufficient documentation

## 2020-08-31 DIAGNOSIS — Z01818 Encounter for other preprocedural examination: Secondary | ICD-10-CM | POA: Diagnosis not present

## 2020-08-31 LAB — POCT URINALYSIS DIP (CLINITEK)
Bilirubin, UA: NEGATIVE
Blood, UA: NEGATIVE
Glucose, UA: NEGATIVE mg/dL
Ketones, POC UA: NEGATIVE mg/dL
Nitrite, UA: NEGATIVE
POC PROTEIN,UA: 30 — AB
Spec Grav, UA: 1.02 (ref 1.010–1.025)
Urobilinogen, UA: 0.2 E.U./dL
pH, UA: 5.5 (ref 5.0–8.0)

## 2020-08-31 NOTE — Progress Notes (Signed)
   Subjective:    Patient ID: [name] [name], female    DOB: 05/18/1956, 65 y.o.   MRN: [id number]  HPI  Pt is a 65 yo obese female with HTN, hypothyroidism who presents to the clinic for pre-surgical clearance.   Pt is scheduled to have right total hip replacement 09/18/2020.   She has already had labs drawn on 4/9.   No other problems or concerns today.  .. Active Ambulatory Problems    Diagnosis Date Noted  . Hypothyroidism 07/02/2007  . Hyperlipidemia 07/02/2007  . OBESITY 07/02/2007  . OSTEOARTHRITIS, HIP, LEFT 04/04/2010  . HTN (hypertension) 04/04/2010  . MIGRAINES, HX OF 07/02/2007  . ADJUSTMENT DISORDER WITH DEPRESSED MOOD 05/23/2010  . Glaucoma 10/16/2012  . IFG (impaired fasting glucose) 10/16/2012  . Myalgia and myositis 10/11/2015  . Pain in joint, ankle and foot 10/11/2015  . Knee pain, bilateral 10/20/2015  . Primary open angle glaucoma of both eyes, indeterminate stage 05/10/2014  . H/O laser assisted in situ keratomileusis 05/10/2014  . Status post cataract extraction and insertion of intraocular lens of left eye 06/24/2014  . BMI 34.0-34.9,adult 05/27/2019  . Preoperative clearance 08/31/2020  . Primary osteoarthritis of right hip 08/31/2020   Resolved Ambulatory Problems    Diagnosis Date Noted  . TOBACCO ABUSE, HX OF 04/04/2010  . Impaired fasting glucose 05/23/2010   Past Medical History:  Diagnosis Date  . Migraine   . Migraines   . Obesity   . Obesity      Review of Systems  All other systems reviewed and are negative.      Objective:   Physical Exam Vitals reviewed.  Constitutional:      Appearance: Normal appearance. She is obese.  Neck:     Vascular: No carotid bruit.  Cardiovascular:     Rate and Rhythm: Normal rate and regular rhythm.     Pulses: Normal pulses.  Pulmonary:     Effort: Pulmonary effort is normal.     Breath sounds: Normal breath sounds.  Abdominal:     Palpations: Abdomen is soft.     Tenderness: There is no  abdominal tenderness. There is no right CVA tenderness or left CVA tenderness.  Musculoskeletal:     Comments: Extreme pain in right hip with walking due to right hip pain.   Neurological:     General: No focal deficit present.     Mental Status: She is alert and oriented to person, place, and time.  Psychiatric:        Mood and Affect: Mood normal.           Assessment & Plan:  Marland KitchenMarland KitchenDiagnoses and all orders for this visit:  Preoperative clearance -     Urine Culture -     POCT URINALYSIS DIP (CLINITEK)  Primary osteoarthritis of right hip -     Urine Culture -     POCT URINALYSIS DIP (CLINITEK)   EKG: no acute changes. NSR. No ST elevation or depression. No arhythmias.  Reviewed labs. No acute findings.  UA dipstick had some leukocytes. Will culture.  Paperwork filled out and released for surgery.

## 2020-09-02 LAB — URINE CULTURE
MICRO NUMBER:: 11774692
SPECIMEN QUALITY:: ADEQUATE

## 2020-09-04 ENCOUNTER — Encounter: Payer: Self-pay | Admitting: Physician Assistant

## 2020-09-04 NOTE — Progress Notes (Signed)
Only scant superficial bacteria seen on culture. No infection.

## 2020-09-04 NOTE — Progress Notes (Signed)
Do you have this information to send?

## 2020-09-04 NOTE — Progress Notes (Signed)
[name] [name] faxed information.

## 2020-09-05 NOTE — Telephone Encounter (Signed)
Left voicemail for patient to call us back to make this IN OFFICE appointment.

## 2020-09-10 ENCOUNTER — Encounter: Payer: Self-pay | Admitting: Family Medicine

## 2020-09-10 DIAGNOSIS — I10 Essential (primary) hypertension: Secondary | ICD-10-CM

## 2020-09-11 MED ORDER — HYDROCHLOROTHIAZIDE 25 MG PO TABS: 1.0000 | ORAL_TABLET | Freq: Every day | ORAL | 1 refills | Status: DC

## 2020-10-06 NOTE — Addendum Note (Signed)
Addended by: Narda Rutherford on: 10/06/2020 09:24 AM   Modules accepted: Orders

## 2020-11-21 ENCOUNTER — Telehealth: Payer: Self-pay | Admitting: Family Medicine

## 2020-11-21 NOTE — Telephone Encounter (Signed)
[name] [name] has scheduled appointment for 12/07/20. AM

## 2020-11-21 NOTE — Telephone Encounter (Signed)
Call patient: We received notification from [facility] for preoperative clearance we are happy to schedule her for an appointment to do that.

## 2020-12-01 ENCOUNTER — Encounter: Payer: Self-pay | Admitting: Family Medicine

## 2020-12-05 ENCOUNTER — Encounter: Payer: Self-pay | Admitting: Family Medicine

## 2020-12-05 NOTE — Telephone Encounter (Signed)
Answered in previous [facility]

## 2020-12-05 NOTE — Telephone Encounter (Signed)
Please call Dr. Honor Loh office for pre-op form

## 2020-12-07 ENCOUNTER — Ambulatory Visit: Payer: 59 | Admitting: Family Medicine

## 2020-12-08 NOTE — Addendum Note (Signed)
Addended by: Beatrice Lecher D on: 12/08/2020 10:35 AM   Modules accepted: Orders

## 2020-12-08 NOTE — Telephone Encounter (Signed)
Forms completed and given to Iron B.

## 2020-12-16 ENCOUNTER — Encounter: Payer: Self-pay | Admitting: Family Medicine

## 2020-12-21 NOTE — Telephone Encounter (Signed)
Called patient and scheduled her for video visit tomorrow for bladder infection. Pt had knee replacement sx and cannot come in .

## 2020-12-22 ENCOUNTER — Telehealth (INDEPENDENT_AMBULATORY_CARE_PROVIDER_SITE_OTHER): Payer: 59 | Admitting: Medical-Surgical

## 2020-12-22 ENCOUNTER — Encounter: Payer: Self-pay | Admitting: Medical-Surgical

## 2020-12-22 ENCOUNTER — Other Ambulatory Visit: Payer: Self-pay

## 2020-12-22 VITALS — Wt 198.0 lb

## 2020-12-22 DIAGNOSIS — N898 Other specified noninflammatory disorders of vagina: Secondary | ICD-10-CM | POA: Diagnosis not present

## 2020-12-22 MED ORDER — NITROFURANTOIN MONOHYD MACRO 100 MG PO CAPS: 100.0000 mg | ORAL_CAPSULE | Freq: Two times a day (BID) | ORAL | 0 refills | Status: DC

## 2020-12-22 MED ORDER — MONISTAT 3 4 % VA CREA: 1.0000 "application " | TOPICAL_CREAM | Freq: Two times a day (BID) | VAGINAL | 0 refills | Status: DC

## 2020-12-22 MED ORDER — FLUCONAZOLE 150 MG PO TABS: 150.0000 mg | ORAL_TABLET | Freq: Once | ORAL | 1 refills | Status: AC

## 2020-12-22 NOTE — Progress Notes (Signed)
Virtual Visit via Video Note  I connected with [name] [name] on 12/22/20 at  3:00 PM EDT by a video enabled telemedicine application and verified that I am speaking with the correct person using two identifiers.   I discussed the limitations of evaluation and management by telemedicine and the availability of in person appointments. The patient expressed understanding and agreed to proceed.  Patient location: home Provider locations: office  Subjective:    CC: Dysuria, vaginal irritation  HPI: Pleasant 65 year old female presenting today via MyChart video visit with complaints of dysuria/vaginal irritation.  She had a knee replacement completed in the last week and is homebound for now.  She has been having urinary/vaginal symptoms for approximately 1 full week.  Notes that she does not have any burning or pain with urination and she has had no discharge or odor.  She is voiding more frequently but she is also drinking more fluids in an effort to flush her system.  She complains of vaginal irritation with soreness and tenderness in the vaginal area.  She notes that she is having itching as well as swelling in the area.  She previously had symptoms similar and was treating for yeast with over-the-counter Monistat but this did not help.  Reports she was sent in an antibiotic which cleared her symptoms right up and she would like to have that sent in again.  Denies fever, chills, shortness of breath, chest pain, GI symptoms, and hematuria.  Notes that she did have to go to the dentist and she took prophylactic amoxicillin at the beginning of last week.  Past medical history, Surgical history, Family history not pertinant except as noted below, Social history, Allergies, and medications have been entered into the medical record, reviewed, and corrections made.   Review of Systems: See HPI for pertinent positives and negatives.   Objective:    General: Speaking clearly in complete sentences  without any shortness of breath.  Alert and oriented x3.  Normal judgment. No apparent acute distress.  Impression and Recommendations:    1. Vaginal irritation Symptoms sound consistent with a vaginal yeast infection.  Sending in Diflucan 150 mg x 1 with repeat dose in 72 hours if symptoms have not completely cleared.  Because we are virtual and she does have some urinary type symptoms, discussed empiric treatment for UTI.  I will send in Macrobid 100 mg twice daily x5 days but advised patient to wait until after she has taken the Diflucan and given that the chance to work to see if she continues to have urinary symptoms.  If she does continue to have urinary symptoms, okay to start [place].  If not, she should avoid taking the antibiotic.  Patient verbalized understanding and is agreeable to the plan.  I discussed the assessment and treatment plan with the patient. The patient was provided an opportunity to ask questions and all were answered. The patient agreed with the plan and demonstrated an understanding of the instructions.   The patient was advised to call back or seek an in-person evaluation if the symptoms worsen or if the condition fails to improve as anticipated.  20 minutes of non-face-to-face time was provided during this encounter.  Return if symptoms worsen or fail to improve.  Clearnce Sorrel, DNP, APRN, FNP-BC [facility]

## 2020-12-22 NOTE — Progress Notes (Signed)
Last weekend UTI symptoms Uncomfortable, itchy, frequent urination

## 2021-01-12 ENCOUNTER — Other Ambulatory Visit: Payer: Self-pay | Admitting: Family Medicine

## 2021-01-12 DIAGNOSIS — I10 Essential (primary) hypertension: Secondary | ICD-10-CM

## 2021-01-14 ENCOUNTER — Encounter: Payer: Self-pay | Admitting: Family Medicine

## 2021-01-14 DIAGNOSIS — E039 Hypothyroidism, unspecified: Secondary | ICD-10-CM

## 2021-01-15 ENCOUNTER — Encounter: Payer: Self-pay | Admitting: Family Medicine

## 2021-01-15 DIAGNOSIS — I10 Essential (primary) hypertension: Secondary | ICD-10-CM

## 2021-01-15 DIAGNOSIS — R7301 Impaired fasting glucose: Secondary | ICD-10-CM

## 2021-01-15 DIAGNOSIS — E039 Hypothyroidism, unspecified: Secondary | ICD-10-CM

## 2021-01-15 MED ORDER — LEVOTHYROXINE SODIUM 125 MCG PO TABS: 125.0000 ug | ORAL_TABLET | Freq: Every day | ORAL | 0 refills | Status: DC

## 2021-01-15 MED ORDER — LEVOTHYROXINE SODIUM 125 MCG PO TABS: 125.0000 ug | ORAL_TABLET | Freq: Every day | ORAL | 1 refills | Status: DC

## 2021-01-23 NOTE — Telephone Encounter (Signed)
Labs ordered.  I am not sure about tongue lesion. Will have to look at in person.  Would have to schedule separately from the regular office visit anyway as we typically reserve separate appointments for procedures.

## 2021-02-15 ENCOUNTER — Encounter: Payer: Self-pay | Admitting: Family Medicine

## 2021-02-16 NOTE — Progress Notes (Signed)
Hi [name] [name], metabolic panel looks good.  A1c looks even better than last year great work.  Still in the prediabetes range.  Neuroid level is normal.  It looks like you are due for colon cancer screening last year in 2021.  Please let us know if you had this done and we can call to get that report

## 2021-02-17 LAB — BASIC METABOLIC PANEL WITH GFR
BUN: 20 mg/dL (ref 7–25)
CO2: 27 mmol/L (ref 20–32)
Calcium: 10.1 mg/dL (ref 8.6–10.4)
Chloride: 104 mmol/L (ref 98–110)
Creat: 0.52 mg/dL (ref 0.50–1.05)
Glucose, Bld: 109 mg/dL — ABNORMAL HIGH (ref 65–99)
Potassium: 4.4 mmol/L (ref 3.5–5.3)
Sodium: 138 mmol/L (ref 135–146)
eGFR: 103 mL/min/{1.73_m2} (ref >=60)

## 2021-02-17 LAB — HEMOGLOBIN A1C
Hgb A1c MFr Bld: 6.1 % of total Hgb — ABNORMAL HIGH (ref <5.7)
Mean Plasma Glucose: 128 mg/dL
eAG (mmol/L): 7.1 mmol/L

## 2021-02-17 LAB — LIPID PANEL
Cholesterol: 171 mg/dL (ref <200)
HDL: 72 mg/dL (ref >=50)
LDL Cholesterol (Calc): 76 mg/dL (calc)
Non-HDL Cholesterol (Calc): 99 mg/dL (calc) (ref <130)
Total CHOL/HDL Ratio: 2.4 (calc) (ref <5.0)
Triglycerides: 157 mg/dL — ABNORMAL HIGH (ref <150)

## 2021-02-17 LAB — TSH: TSH: 1.65 mIU/L (ref 0.40–4.50)

## 2021-02-22 ENCOUNTER — Ambulatory Visit: Payer: 59 | Admitting: Family Medicine

## 2021-02-22 LAB — HM DIABETES EYE EXAM

## 2021-02-26 ENCOUNTER — Ambulatory Visit (INDEPENDENT_AMBULATORY_CARE_PROVIDER_SITE_OTHER): Payer: 59 | Admitting: Family Medicine

## 2021-02-26 ENCOUNTER — Other Ambulatory Visit: Payer: Self-pay

## 2021-02-26 ENCOUNTER — Encounter: Payer: Self-pay | Admitting: Family Medicine

## 2021-02-26 VITALS — BP 136/68 | HR 74 | Ht 69.0 in | Wt 206.0 lb

## 2021-02-26 DIAGNOSIS — I10 Essential (primary) hypertension: Secondary | ICD-10-CM | POA: Diagnosis not present

## 2021-02-26 DIAGNOSIS — R7301 Impaired fasting glucose: Secondary | ICD-10-CM

## 2021-02-26 DIAGNOSIS — E782 Mixed hyperlipidemia: Secondary | ICD-10-CM | POA: Diagnosis not present

## 2021-02-26 DIAGNOSIS — E611 Iron deficiency: Secondary | ICD-10-CM

## 2021-02-26 DIAGNOSIS — F4321 Adjustment disorder with depressed mood: Secondary | ICD-10-CM

## 2021-02-26 MED ORDER — HYDROCHLOROTHIAZIDE 25 MG PO TABS: 25.0000 mg | ORAL_TABLET | Freq: Every day | ORAL | 1 refills | Status: DC

## 2021-02-26 MED ORDER — AMLODIPINE BESYLATE 5 MG PO TABS: 5.0000 mg | ORAL_TABLET | Freq: Every day | ORAL | 1 refills | Status: DC

## 2021-02-26 NOTE — Progress Notes (Signed)
Established Patient Office Visit  Subjective:  Patient ID: [name] [name], female    DOB: May 12, 1956  Age: 65 y.o. MRN: [id number]  CC:  Chief Complaint  Patient presents with   Hypertension    HPI Brandon Scarbrough presents for   Hypertension- Pt denies chest pain, SOB, dizziness, or heart palpitations.  Taking meds as directed w/o problems.  Denies medication side effects.    Impaired fasting glucose-no increased thirst or urination. No symptoms consistent with hypoglycemia.  Tongue lesion x 1 year.  Keeps hitting it with teeth. Feels like it is from trauma.    She really wants to also come off of her cholesterol medication over the last year she is really worked hard to lose some weight and really changed her diet she says she has not lost any weight recently because she had her knee surgery and she still recovering she is not quite up to full exercise she still using a cane some going up or down stairs.  But her plan is to get back down to about 150 -160 pounds.  She says she is can stay on the cholesterol medication through the holidays but she would really like to come off after that.  She went to donate blood and was told her iron was low.  She did have 2 surgeries this year.  She did mention it to her surgeon who she saw her recently for follow-up.  She does take a multivitamin for over 50.  Also reports in the last year she is just felt a little bit more emotional she says she gets more tearful at things denies feeling depressed per se.  But she also notices that she is more angry at times and and irritable.  She is also planning on retiring in February   Past Medical History:  Diagnosis Date   HTN (hypertension) 04/04/2010   Qualifier: Diagnosis of  By: Valetta Close DO, Karen     Hyperlipidemia    Hypothyroidism    Migraine    hx of   Migraines    Obesity    Obesity     Past Surgical History:  Procedure Laterality Date   ABDOMINAL HYSTERECTOMY  2005   complete    CATARACT EXTRACTION W/ INTRAOCULAR LENS IMPLANT Left 05/10/14   dexa  4/07   (+) 1.7   JOINT REPLACEMENT     L hip replacement 2011   migraines, resolved  2003   Right foot surgery  2000   callus removed   TOTAL ABDOMINAL HYSTERECTOMY  2005    Family History  Problem Relation Age of Onset   Heart attack Mother    CAD Mother        stents   Hyperlipidemia Mother    Other Father 18       stents   Hypertension Father    Hyperlipidemia Brother     Social History   Socioeconomic History   Marital status: Divorced    Spouse name: divorced   Number of children: Not on file   Years of education: Not on file   Highest education level: Not on file  Occupational History    Employer: [employer]  Tobacco Use   Smoking status: Former    Types: Cigarettes    Quit date: 05/20/1994    Years since quitting: 26.7   Smokeless tobacco: Never  Substance and Sexual Activity   Alcohol use: Yes    Alcohol/week: 3.0 standard drinks    Types: 3 Glasses of wine  per week    Comment: 2-3 glasses of wine on weekends   Drug use: No   Sexual activity: Not Currently  Other Topics Concern   Not on file  Social History Narrative   Regular exercise, walking   Social Determinants of Health   Financial Resource Strain: Not on file  Food Insecurity: Not on file  Transportation Needs: Not on file  Physical Activity: Not on file  Stress: Not on file  Social Connections: Not on file  Intimate Partner Violence: Not on file    Outpatient Medications Prior to Visit  Medication Sig Dispense Refill   Calcium Carbonate (CALCIUM 600 PO) Take by mouth.     celecoxib (CELEBREX) 200 MG capsule Take 200 mg by mouth 2 (two) times daily.     Cholecalciferol 125 MCG (5000 UT) capsule Take 5,000 Units by mouth daily.     Cyanocobalamin (B-12) 2500 MCG TABS Take 1 tablet by mouth daily.     fish oil-omega-3 fatty acids 1000 MG capsule Take 1 g by mouth 2 (two) times daily.      imiquimod (ALDARA) 5 %  cream 1 application topically 5 times weekly for a total of 6 weeks. 24 each 0   levothyroxine (SYNTHROID) 125 MCG tablet Take 1 tablet (125 mcg total) by mouth daily. 90 tablet 1   metFORMIN (GLUCOPHAGE) 500 MG tablet Take 1 tablet (500 mg total) by mouth 2 (two) times daily with a meal. 180 tablet 3   Multiple Vitamin (MULTIVITAMIN) capsule Take 1 capsule by mouth daily.     rosuvastatin (CRESTOR) 5 MG tablet Take 1 tablet (5 mg total) by mouth at bedtime. 90 tablet 3   timolol (TIMOPTIC) 0.5 % ophthalmic solution INSTILL 1 DROP INTO BOTH   EYES TWICE DAILY 15 mL 3   triamcinolone (KENALOG) 0.1 % Apply 1 application topically at bedtime. 15 g 0   amLODipine (NORVASC) 5 MG tablet TAKE 1 TABLET DAILY 90 tablet 0   ASPIRIN LOW DOSE 81 MG chewable tablet Chew 81 mg by mouth 2 (two) times daily.     docusate sodium (COLACE) 100 MG capsule Take 100 mg by mouth 2 (two) times daily.     hydrochlorothiazide (HYDRODIURIL) 25 MG tablet Take 1 tablet (25 mg total) by mouth daily. Needs appointment. 90 tablet 0   HYDROcodone-acetaminophen (NORCO) 7.5-325 MG tablet Take 1-2 tablets by mouth every 4 (four) hours as needed.     MICONAZOLE NITRATE VAGINAL (MONISTAT 3) 4 % CREA Apply 1 application topically in the morning and at bedtime. 25 g 0   nitrofurantoin, macrocrystal-monohydrate, (MACROBID) 100 MG capsule Take 1 capsule (100 mg total) by mouth 2 (two) times daily. 10 capsule 0   No facility-administered medications prior to visit.    Allergies  Allergen Reactions   Lipitor [Atorvastatin] Other (See Comments)    Myalgia   Lisinopril     Throat swelling   Simvastatin Other (See Comments)    Myalgias and joint pain.   Statins    Livalo [Pitavastatin] Rash    Rash on face, itchy skin, joint stiffness    ROS Review of Systems    Objective:    Physical Exam Constitutional:      Appearance: Normal appearance. She is well-developed.  HENT:     Head: Normocephalic and atraumatic.   Cardiovascular:     Rate and Rhythm: Normal rate and regular rhythm.     Heart sounds: Normal heart sounds.  Pulmonary:     Effort: Pulmonary  effort is normal.     Breath sounds: Normal breath sounds.  Skin:    General: Skin is warm and dry.  Neurological:     Mental Status: She is alert and oriented to person, place, and time.  Psychiatric:        Behavior: Behavior normal.    BP 136/68   Pulse 74   Ht 5' 9"  (1.753 m)   Wt 206 lb (93.4 kg)   SpO2 99%   BMI 30.42 kg/m  Wt Readings from Last 3 Encounters:  02/26/21 206 lb (93.4 kg)  12/22/20 198 lb (89.8 kg)  08/31/20 205 lb (93 kg)     Health Maintenance Due  Topic Date Due   COLONOSCOPY (Pts 45-71yr Insurance coverage will need to be confirmed)  08/16/2019   COVID-19 Vaccine (4 - Booster for Moderna series) 07/31/2020   COLON CANCER SCREENING ANNUAL FOBT  10/13/2020   DEXA SCAN  11/24/2020    There are no preventive care reminders to display for this patient.  Lab Results  Component Value Date   TSH 1.65 02/14/2021   Lab Results  Component Value Date   WBC 6.8 12/17/2019   HGB 13.2 12/17/2019   HCT 39.9 12/17/2019   MCV 89.3 12/17/2019   PLT 388 12/17/2019   Lab Results  Component Value Date   NA 138 02/14/2021   K 4.4 02/14/2021   CO2 27 02/14/2021   GLUCOSE 109 (H) 02/14/2021   BUN 20 02/14/2021   CREATININE 0.52 02/14/2021   BILITOT 0.5 12/17/2019   ALKPHOS 75 11/25/2016   AST 18 12/17/2019   ALT 19 12/17/2019   PROT 7.1 12/17/2019   ALBUMIN 4.3 11/25/2016   CALCIUM 10.1 02/14/2021   EGFR 103 02/14/2021   Lab Results  Component Value Date   CHOL 171 02/14/2021   Lab Results  Component Value Date   HDL 72 02/14/2021   Lab Results  Component Value Date   LDLCALC 76 02/14/2021   Lab Results  Component Value Date   TRIG 157 (H) 02/14/2021   Lab Results  Component Value Date   CHOLHDL 2.4 02/14/2021   Lab Results  Component Value Date   HGBA1C 6.1 (H) 02/14/2021       Assessment & Plan:   Problem List Items Addressed This Visit       Cardiovascular and Mediastinum   HTN (hypertension)    Reports home blood pressures normal.  We did discuss optimal blood pressure with systolic less than 1427  She will keep an eye on it at home.  Did refill her amlodipine and HCTZ today.      Relevant Medications   amLODipine (NORVASC) 5 MG tablet   hydrochlorothiazide (HYDRODIURIL) 25 MG tablet     Endocrine   IFG (impaired fasting glucose)    Recent A1c of 6.1 stable.  Recheck again in 6 months.  Continue work on [employer]and regular exercise.        Other   Hyperlipidemia    She really wants to be will come off of her cholesterol medication and has made some major lifestyle changes including changing up her diet and losing weight she says her goal is to get down to about 160 pounds and so we discussed possibly coming off the statin after the holidays or either at her follow-up in 6 months if she is able to continue to get her weight down.      Relevant Medications   amLODipine (NORVASC) 5 MG tablet  hydrochlorothiazide (HYDRODIURIL) 25 MG tablet   ADJUSTMENT DISORDER WITH DEPRESSED MOOD    We discussed a couple of different options she is not interested in medication at this point.  We also discussed referral for therapy.  Also will monitor it over the next several months to see if improving on its own as she gets more mobile after her recent surgery.  It sounds like she also has some major life changes coming up with retirement and so therapy could actually be really helpful.      Other Visit Diagnoses     Low iron    -  Primary   Essential hypertension       Relevant Medications   amLODipine (NORVASC) 5 MG tablet   hydrochlorothiazide (HYDRODIURIL) 25 MG tablet      Low iron-encouraged her to consider getting a multivitamin with iron or an extra over-the-counter iron supplement we also discussed foods that are rich in iron.  She says she will  probably try to go donate blood again in about 3 months and can see if her iron levels look better.   Meds ordered this encounter  Medications   amLODipine (NORVASC) 5 MG tablet    Sig: Take 1 tablet (5 mg total) by mouth daily.    Dispense:  90 tablet    Refill:  1   hydrochlorothiazide (HYDRODIURIL) 25 MG tablet    Sig: Take 1 tablet (25 mg total) by mouth daily.    Dispense:  90 tablet    Refill:  1    Requesting 1 year supply    Follow-up: Return in about 6 months (around 08/27/2021) for HTN.    Beatrice Lecher, MD

## 2021-02-26 NOTE — Assessment & Plan Note (Signed)
She really wants to be will come off of her cholesterol medication and has made some major lifestyle changes including changing up her diet and losing weight she says her goal is to get down to about 160 pounds and so we discussed possibly coming off the statin after the holidays or either at her follow-up in 6 months if she is able to continue to get her weight down.

## 2021-02-26 NOTE — Assessment & Plan Note (Signed)
We discussed a couple of different options she is not interested in medication at this point.  We also discussed referral for therapy.  Also will monitor it over the next several months to see if improving on its own as she gets more mobile after her recent surgery.  It sounds like she also has some major life changes coming up with retirement and so therapy could actually be really helpful.

## 2021-02-26 NOTE — Assessment & Plan Note (Addendum)
Reports home blood pressures normal.  We did discuss optimal blood pressure with systolic less than 234.  She will keep an eye on it at home.  Did refill her amlodipine and HCTZ today.

## 2021-02-26 NOTE — Assessment & Plan Note (Signed)
Recent A1c of 6.1 stable.  Recheck again in 6 months.  Continue work on [employer] and regular exercise.

## 2021-02-28 ENCOUNTER — Encounter: Payer: Self-pay | Admitting: Family Medicine

## 2021-03-09 ENCOUNTER — Other Ambulatory Visit (INDEPENDENT_AMBULATORY_CARE_PROVIDER_SITE_OTHER): Payer: 59 | Admitting: *Deleted

## 2021-03-09 DIAGNOSIS — Z1211 Encounter for screening for malignant neoplasm of colon: Secondary | ICD-10-CM

## 2021-03-09 LAB — POC HEMOCCULT BLD/STL (HOME/3-CARD/SCREEN)
Card #2 Fecal Occult Blod, POC: NEGATIVE
Card #3 Fecal Occult Blood, POC: NEGATIVE
Fecal Occult Blood, POC: NEGATIVE

## 2021-03-09 NOTE — Progress Notes (Signed)
[employer].  Stool cards are negative which is fantastic.  Recommend repeat colon cancer screening in 1 year.

## 2021-03-15 ENCOUNTER — Other Ambulatory Visit: Payer: Self-pay

## 2021-03-15 ENCOUNTER — Ambulatory Visit (INDEPENDENT_AMBULATORY_CARE_PROVIDER_SITE_OTHER): Payer: 59 | Admitting: Family Medicine

## 2021-03-15 ENCOUNTER — Other Ambulatory Visit: Payer: Self-pay | Admitting: Family Medicine

## 2021-03-15 ENCOUNTER — Encounter: Payer: Self-pay | Admitting: Family Medicine

## 2021-03-15 DIAGNOSIS — K148 Other diseases of tongue: Secondary | ICD-10-CM | POA: Diagnosis not present

## 2021-03-15 NOTE — Progress Notes (Signed)
Acute Office Visit  Subjective:    Patient ID: [name] [name], female    DOB: [birth year], 65 y.o.   MRN: [id number]  No chief complaint on file.   HPI Patient is in today for removal of lesion on her tongue.  Tongue lesion x 1 year.  Keeps hitting it with teeth. Feels like it is from trauma.  Since I last saw her a couple of weeks ago she actually has developed a new lesion on the posterior lateral left side of the tongue.  She says it is not as bothersome as irritating as the lesion on the right front anterior area  Past Medical History:  Diagnosis Date   HTN (hypertension) 04/04/2010   Qualifier: Diagnosis of  By: Valetta Close DO, Karen     Hyperlipidemia    Hypothyroidism    Migraine    hx of   Migraines    Obesity    Obesity     Past Surgical History:  Procedure Laterality Date   ABDOMINAL HYSTERECTOMY  2005   complete   CATARACT EXTRACTION W/ INTRAOCULAR LENS IMPLANT Left 05/10/14   dexa  4/07   (+) 1.7   JOINT REPLACEMENT     L hip replacement 2011   migraines, resolved  2003   Right foot surgery  2000   callus removed   TOTAL ABDOMINAL HYSTERECTOMY  2005    Family History  Problem Relation Age of Onset   Heart attack Mother    CAD Mother        stents   Hyperlipidemia Mother    Other Father 6       stents   Hypertension Father    Hyperlipidemia Brother     Social History   Socioeconomic History   Marital status: Divorced    Spouse name: divorced   Number of children: Not on file   Years of education: Not on file   Highest education level: Not on file  Occupational History    Employer: [employer]  Tobacco Use   Smoking status: Former    Types: Cigarettes    Quit date: 05/20/1994    Years since quitting: 26.8   Smokeless tobacco: Never  Substance and Sexual Activity   Alcohol use: Yes    Alcohol/week: 3.0 standard drinks    Types: 3 Glasses of wine per week    Comment: 2-3 glasses of wine on weekends   Drug use: No   Sexual activity: Not  Currently  Other Topics Concern   Not on file  Social History Narrative   Regular exercise, walking   Social Determinants of Health   Financial Resource Strain: Not on file  Food Insecurity: Not on file  Transportation Needs: Not on file  Physical Activity: Not on file  Stress: Not on file  Social Connections: Not on file  Intimate Partner Violence: Not on file    Outpatient Medications Prior to Visit  Medication Sig Dispense Refill   amLODipine (NORVASC) 5 MG tablet Take 1 tablet (5 mg total) by mouth daily. 90 tablet 1   Calcium Carbonate (CALCIUM 600 PO) Take by mouth.     Cholecalciferol 125 MCG (5000 UT) capsule Take 5,000 Units by mouth daily.     Cyanocobalamin (B-12) 2500 MCG TABS Take 1 tablet by mouth daily.     dorzolamide-timolol (COSOPT) 22.3-6.8 MG/ML ophthalmic solution SMARTSIG:In Eye(s)     fish oil-omega-3 fatty acids 1000 MG capsule Take 1 g by mouth 2 (two) times daily.  hydrochlorothiazide (HYDRODIURIL) 25 MG tablet Take 1 tablet (25 mg total) by mouth daily. 90 tablet 1   levothyroxine (SYNTHROID) 125 MCG tablet Take 1 tablet (125 mcg total) by mouth daily. 90 tablet 1   metFORMIN (GLUCOPHAGE) 500 MG tablet Take 1 tablet (500 mg total) by mouth 2 (two) times daily with a meal. 180 tablet 3   Multiple Vitamin (MULTIVITAMIN) capsule Take 1 capsule by mouth daily.     rosuvastatin (CRESTOR) 5 MG tablet Take 1 tablet (5 mg total) by mouth at bedtime. 90 tablet 3   timolol (TIMOPTIC) 0.5 % ophthalmic solution INSTILL 1 DROP INTO BOTH   EYES TWICE DAILY 15 mL 3   triamcinolone (KENALOG) 0.1 % Apply 1 application topically at bedtime. 15 g 0   celecoxib (CELEBREX) 200 MG capsule Take 200 mg by mouth 2 (two) times daily.     imiquimod (ALDARA) 5 % cream 1 application topically 5 times weekly for a total of 6 weeks. 24 each 0   No facility-administered medications prior to visit.    Allergies  Allergen Reactions   Lipitor [Atorvastatin] Other (See Comments)     Myalgia   Lisinopril     Throat swelling   Simvastatin Other (See Comments)    Myalgias and joint pain.   Statins    Livalo [Pitavastatin] Rash    Rash on face, itchy skin, joint stiffness    Review of Systems     Objective:    Physical Exam  BP 138/60   Pulse 80   Ht 5' 9"  (1.753 m)   Wt 206 lb (93.4 kg)   SpO2 99%   BMI 30.42 kg/m  Wt Readings from Last 3 Encounters:  03/15/21 206 lb (93.4 kg)  02/26/21 206 lb (93.4 kg)  12/22/20 198 lb (89.8 kg)    Health Maintenance Due  Topic Date Due   Pneumonia Vaccine 65+ Years old (1 - PCV) Never done   COLONOSCOPY (Pts 45-53yr Insurance coverage will need to be confirmed)  08/16/2019   COVID-19 Vaccine (4 - Booster for Moderna series) 07/03/2020   DEXA SCAN  11/24/2020    There are no preventive care reminders to display for this patient.   Lab Results  Component Value Date   TSH 1.65 02/14/2021   Lab Results  Component Value Date   WBC 6.8 12/17/2019   HGB 13.2 12/17/2019   HCT 39.9 12/17/2019   MCV 89.3 12/17/2019   PLT 388 12/17/2019   Lab Results  Component Value Date   NA 138 02/14/2021   K 4.4 02/14/2021   CO2 27 02/14/2021   GLUCOSE 109 (H) 02/14/2021   BUN 20 02/14/2021   CREATININE 0.52 02/14/2021   BILITOT 0.5 12/17/2019   ALKPHOS 75 11/25/2016   AST 18 12/17/2019   ALT 19 12/17/2019   PROT 7.1 12/17/2019   ALBUMIN 4.3 11/25/2016   CALCIUM 10.1 02/14/2021   EGFR 103 02/14/2021   Lab Results  Component Value Date   CHOL 171 02/14/2021   Lab Results  Component Value Date   HDL 72 02/14/2021   Lab Results  Component Value Date   LDLCALC 76 02/14/2021   Lab Results  Component Value Date   TRIG 157 (H) 02/14/2021   Lab Results  Component Value Date   CHOLHDL 2.4 02/14/2021   Lab Results  Component Value Date   HGBA1C 6.1 (H) 02/14/2021       Assessment & Plan:   Problem List Items Addressed This Visit  None Visit Diagnoses     Tongue lesion         Shave Biopsy  Procedure Note  Pre-operative Diagnosis: Suspicious lesion, tongue lesion  Post-operative Diagnosis: same  Locations: 1 lesion on the right anterior tongue, second lesion on the posterior left lateral  Indications: irritation, not healing.    Anesthesia: Lidocaine 1% without epinephrine without added sodium bicarbonate  Procedure Details  Patient informed of the risks (including bleeding and infection) and benefits of the  procedure and Verbal informed consent obtained.  Both areas were numbed with 1% lidocaine without epinephrine.  Once she felt numb the lesion on the right anterior tongue was removed with forceps and a double blade.  The lesion on the posterior left lateral tongue was removed with iris scissors.  Both lesions were sent for pathology.  Hemostasis achieved with pressure. Antibiotic ointment and a sterile dressing applied.  The specimen was sent for pathologic examination. The patient tolerated the procedure well.  EBL: trace  Findings: Await pathology   Condition: Stable  Complications: none.  Plan: 1. Instructed to keep the wound dry and covered for 24-48h and clean thereafter. 2. Warning signs of infection were reviewed.   3. Recommended that the patient use OTC acetaminophen as needed for pain.  4. Return PRN.    No orders of the defined types were placed in this encounter.    Beatrice Lecher, MD

## 2021-03-15 NOTE — Patient Instructions (Signed)
Avoid any real hot or cold foods.  Avoid anything particularly spicy or salty.  Would recommend very soft foods nothing rough or hard textured to chew.  Probably for about the next 2 days if at that point doing well then okay to start eating normally.  Make sure to hydrate well.  If it starts bleeding again then please use some gauze and put pressure on it until it stops.

## 2021-03-15 NOTE — Addendum Note (Signed)
Addended by: Teddy Spike on: 03/15/2021 05:04 PM   Modules accepted: Orders

## 2021-03-20 ENCOUNTER — Encounter: Payer: Self-pay | Admitting: Family Medicine

## 2021-03-20 NOTE — Progress Notes (Signed)
Hi [name] [name], I hope you are doing well and I hope everything healed well.  Just wanted to let you know that both lesions are completely benign.  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  Which basically means of scar tissue and it just showed some inflammation.  The other small newer one in the back basically was also from reactive change or irritation.  Hopefully this will take care of it but if you start to get new lesions you really might want to speak with your dentist to see if something is going on that is causing the irritation

## 2021-05-01 ENCOUNTER — Encounter: Payer: Self-pay | Admitting: Family Medicine

## 2021-05-02 ENCOUNTER — Other Ambulatory Visit: Payer: Self-pay

## 2021-05-08 ENCOUNTER — Other Ambulatory Visit: Payer: Self-pay | Admitting: Family Medicine

## 2021-05-08 DIAGNOSIS — R7301 Impaired fasting glucose: Secondary | ICD-10-CM

## 2021-05-18 ENCOUNTER — Other Ambulatory Visit: Payer: Self-pay | Admitting: Family Medicine

## 2021-05-18 DIAGNOSIS — E039 Hypothyroidism, unspecified: Secondary | ICD-10-CM

## 2021-05-25 ENCOUNTER — Encounter: Payer: Self-pay | Admitting: Family Medicine

## 2021-05-25 DIAGNOSIS — I10 Essential (primary) hypertension: Secondary | ICD-10-CM

## 2021-05-25 DIAGNOSIS — E782 Mixed hyperlipidemia: Secondary | ICD-10-CM

## 2021-05-25 DIAGNOSIS — E039 Hypothyroidism, unspecified: Secondary | ICD-10-CM

## 2021-05-25 DIAGNOSIS — R7301 Impaired fasting glucose: Secondary | ICD-10-CM

## 2021-05-25 MED ORDER — ROSUVASTATIN CALCIUM 5 MG PO TABS: 5.0000 mg | ORAL_TABLET | Freq: Every day | ORAL | 0 refills | Status: DC

## 2021-05-25 MED ORDER — HYDROCHLOROTHIAZIDE 25 MG PO TABS: 25.0000 mg | ORAL_TABLET | Freq: Every day | ORAL | 0 refills | Status: DC

## 2021-05-25 MED ORDER — LEVOTHYROXINE SODIUM 125 MCG PO TABS: 125.0000 ug | ORAL_TABLET | Freq: Every day | ORAL | 0 refills | Status: DC

## 2021-05-25 MED ORDER — AMLODIPINE BESYLATE 5 MG PO TABS: 5.0000 mg | ORAL_TABLET | Freq: Every day | ORAL | 0 refills | Status: DC

## 2021-05-25 MED ORDER — METFORMIN HCL 500 MG PO TABS: 500.0000 mg | ORAL_TABLET | Freq: Two times a day (BID) | ORAL | 0 refills | Status: DC

## 2021-06-14 ENCOUNTER — Other Ambulatory Visit: Payer: Self-pay | Admitting: Family Medicine

## 2021-06-14 DIAGNOSIS — Z1231 Encounter for screening mammogram for malignant neoplasm of breast: Secondary | ICD-10-CM

## 2021-07-10 DIAGNOSIS — H52223 Regular astigmatism, bilateral: Secondary | ICD-10-CM | POA: Diagnosis not present

## 2021-08-01 ENCOUNTER — Ambulatory Visit: Payer: Medicare HMO

## 2021-08-09 ENCOUNTER — Ambulatory Visit: Payer: Medicare HMO

## 2021-08-10 ENCOUNTER — Other Ambulatory Visit: Payer: Self-pay | Admitting: Family Medicine

## 2021-08-10 DIAGNOSIS — E039 Hypothyroidism, unspecified: Secondary | ICD-10-CM

## 2021-08-10 DIAGNOSIS — E782 Mixed hyperlipidemia: Secondary | ICD-10-CM

## 2021-08-10 DIAGNOSIS — I10 Essential (primary) hypertension: Secondary | ICD-10-CM

## 2021-08-10 DIAGNOSIS — R7301 Impaired fasting glucose: Secondary | ICD-10-CM

## 2021-08-21 ENCOUNTER — Encounter: Payer: Self-pay | Admitting: Family Medicine

## 2021-08-21 DIAGNOSIS — E782 Mixed hyperlipidemia: Secondary | ICD-10-CM

## 2021-08-21 DIAGNOSIS — R7301 Impaired fasting glucose: Secondary | ICD-10-CM

## 2021-08-21 DIAGNOSIS — E039 Hypothyroidism, unspecified: Secondary | ICD-10-CM

## 2021-08-21 DIAGNOSIS — I10 Essential (primary) hypertension: Secondary | ICD-10-CM

## 2021-08-21 NOTE — Telephone Encounter (Signed)
Annual labs ordered for patient. Pended for covering provider's review/approval.  ?

## 2021-08-21 NOTE — Telephone Encounter (Signed)
Signed.

## 2021-08-22 DIAGNOSIS — I10 Essential (primary) hypertension: Secondary | ICD-10-CM | POA: Diagnosis not present

## 2021-08-22 DIAGNOSIS — E782 Mixed hyperlipidemia: Secondary | ICD-10-CM | POA: Diagnosis not present

## 2021-08-22 DIAGNOSIS — R7301 Impaired fasting glucose: Secondary | ICD-10-CM | POA: Diagnosis not present

## 2021-08-22 DIAGNOSIS — E039 Hypothyroidism, unspecified: Secondary | ICD-10-CM | POA: Diagnosis not present

## 2021-08-23 LAB — COMPLETE METABOLIC PANEL WITH GFR
AG Ratio: 1.6 (calc) (ref 1.0–2.5)
ALT: 17 U/L (ref 6–29)
AST: 19 U/L (ref 10–35)
Albumin: 4.2 g/dL (ref 3.6–5.1)
Alkaline phosphatase (APISO): 90 U/L (ref 37–153)
BUN: 19 mg/dL (ref 7–25)
CO2: 26 mmol/L (ref 20–32)
Calcium: 10.4 mg/dL (ref 8.6–10.4)
Chloride: 103 mmol/L (ref 98–110)
Creat: 0.59 mg/dL (ref 0.50–1.05)
Globulin: 2.7 g/dL (calc) (ref 1.9–3.7)
Glucose, Bld: 111 mg/dL — ABNORMAL HIGH (ref 65–99)
Potassium: 4.5 mmol/L (ref 3.5–5.3)
Sodium: 137 mmol/L (ref 135–146)
Total Bilirubin: 0.6 mg/dL (ref 0.2–1.2)
Total Protein: 6.9 g/dL (ref 6.1–8.1)
eGFR: 100 mL/min/{1.73_m2} (ref >=60)

## 2021-08-23 LAB — LIPID PANEL W/REFLEX DIRECT LDL
Cholesterol: 193 mg/dL (ref <200)
HDL: 76 mg/dL (ref >=50)
LDL Cholesterol (Calc): 96 mg/dL (calc)
Non-HDL Cholesterol (Calc): 117 mg/dL (calc) (ref <130)
Total CHOL/HDL Ratio: 2.5 (calc) (ref <5.0)
Triglycerides: 113 mg/dL (ref <150)

## 2021-08-23 LAB — HEMOGLOBIN A1C
Hgb A1c MFr Bld: 6.4 % of total Hgb — ABNORMAL HIGH (ref <5.7)
Mean Plasma Glucose: 137 mg/dL
eAG (mmol/L): 7.6 mmol/L

## 2021-08-23 LAB — TSH: TSH: 1.03 mIU/L (ref 0.40–4.50)

## 2021-08-23 NOTE — Progress Notes (Signed)
A1C is up from 6 months ago at 6.4 but still to goal. Continue to work on diet low in sugars and carbs. Target exercise goal 150 minutes a week.  ? ?Dr. Madilyn Fireman is out of office this week. Aeris Hersman PA-C is filling in while out.

## 2021-08-27 ENCOUNTER — Ambulatory Visit (INDEPENDENT_AMBULATORY_CARE_PROVIDER_SITE_OTHER): Payer: Medicare HMO | Admitting: Family Medicine

## 2021-08-27 ENCOUNTER — Encounter: Payer: Self-pay | Admitting: Family Medicine

## 2021-08-27 VITALS — BP 139/67 | HR 79 | Ht 69.0 in | Wt 223.0 lb

## 2021-08-27 DIAGNOSIS — Z23 Encounter for immunization: Secondary | ICD-10-CM | POA: Diagnosis not present

## 2021-08-27 DIAGNOSIS — Z780 Asymptomatic menopausal state: Secondary | ICD-10-CM

## 2021-08-27 DIAGNOSIS — E039 Hypothyroidism, unspecified: Secondary | ICD-10-CM

## 2021-08-27 DIAGNOSIS — I10 Essential (primary) hypertension: Secondary | ICD-10-CM | POA: Diagnosis not present

## 2021-08-27 DIAGNOSIS — R7301 Impaired fasting glucose: Secondary | ICD-10-CM

## 2021-08-27 LAB — POCT GLYCOSYLATED HEMOGLOBIN (HGB A1C): Hemoglobin A1C: 5.8 % — AB (ref 4.0–5.6)

## 2021-08-27 NOTE — Progress Notes (Signed)
? ?Established Patient Office Visit ? ?Subjective:  ?Patient ID: [name] [name], female    DOB: [birth year]  Age: 66 y.o. MRN: [id number] ? ?CC:  ?Chief Complaint  ?Patient presents with  ? Hypertension  ? IFG  ? ? ?HPI ?[name] [name] presents for  ? ?Hypertension- Pt denies chest pain, SOB, dizziness, or heart palpitations.  Taking meds as directed w/o problems.  Denies medication side effects.   ? ?Impaired fasting glucose-no increased thirst or urination. No symptoms consistent with hypoglycemia. ?Lab Results  ?Component Value Date  ? HGBA1C 5.8 (A) 08/27/2021  ? ? ?Hypothyroidism - Taking medication regularly in the AM away from food and vitamins, etc. No recent change to skin, hair, or energy levels. ? ?Hyperlipidemia - tolerating stating well with no myalgias or significant side effects. LDL up slightly.  ?Lab Results  ?Component Value Date  ? CHOL 193 08/22/2021  ? HDL 76 08/22/2021  ? [place] 96 08/22/2021  ? LDLDIRECT 156 (H) 09/11/2017  ? TRIG 113 08/22/2021  ? CHOLHDL 2.5 08/22/2021  ? ? ? ? ?Past Medical History:  ?Diagnosis Date  ? HTN (hypertension) 04/04/2010  ? Qualifier: Diagnosis of  By: Esmeralda Arthur    ? Hyperlipidemia   ? Hypothyroidism   ? Migraine   ? hx of  ? Migraines   ? Obesity   ? Obesity   ? ? ?Past Surgical History:  ?Procedure Laterality Date  ? ABDOMINAL HYSTERECTOMY  2005  ? complete  ? CATARACT EXTRACTION W/ INTRAOCULAR LENS IMPLANT Left 05/10/14  ? dexa  4/07  ? (+) 1.7  ? JOINT REPLACEMENT    ? L hip replacement 2011  ? migraines, resolved  2003  ? Right foot surgery  2000  ? callus removed  ? TOTAL ABDOMINAL HYSTERECTOMY  2005  ? ? ?Family History  ?Problem Relation Age of Onset  ? Heart attack Mother   ? CAD Mother   ?     stents  ? Hyperlipidemia Mother   ? Other Father 32  ?     stents  ? Hypertension Father   ? Hyperlipidemia Brother   ? ? ?Social History  ? ?Socioeconomic History  ? Marital status: Divorced  ?  Spouse name: divorced  ? Number of children: Not on file  ?  Years of education: Not on file  ? Highest education level: Not on file  ?Occupational History  ?  Employer: [employer]  ?Tobacco Use  ? Smoking status: Former  ?  Types: Cigarettes  ?  Quit date: 05/20/1994  ?  Years since quitting: 27.2  ? Smokeless tobacco: Never  ?Substance and Sexual Activity  ? Alcohol use: Yes  ?  Alcohol/week: 3.0 standard drinks  ?  Types: 3 Glasses of wine per week  ?  Comment: 2-3 glasses of wine on weekends  ? Drug use: No  ? Sexual activity: Not Currently  ?Other Topics Concern  ? Not on file  ?Social History Narrative  ? Regular exercise, walking  ? ?Social Determinants of Health  ? ?Financial Resource Strain: Not on file  ?Food Insecurity: Not on file  ?Transportation Needs: Not on file  ?Physical Activity: Not on file  ?Stress: Not on file  ?Social Connections: Not on file  ?Intimate Partner Violence: Not on file  ? ? ?Outpatient Medications Prior to Visit  ?Medication Sig Dispense Refill  ? amLODipine (NORVASC) 5 MG tablet TAKE 1 TABLET EVERY DAY 90 tablet 0  ? Calcium Carbonate (CALCIUM 600 PO)  Take by mouth.    ? Cholecalciferol 125 MCG (5000 UT) capsule Take 5,000 Units by mouth daily.    ? Cyanocobalamin (B-12) 2500 MCG TABS Take 1 tablet by mouth daily.    ? fish oil-omega-3 fatty acids 1000 MG capsule Take 1 g by mouth 2 (two) times daily.     ? hydrochlorothiazide (HYDRODIURIL) 25 MG tablet TAKE 1 TABLET EVERY DAY 90 tablet 0  ? levothyroxine (SYNTHROID) 125 MCG tablet TAKE 1 TABLET EVERY DAY 90 tablet 0  ? metFORMIN (GLUCOPHAGE) 500 MG tablet TAKE 1 TABLET TWICE DAILY WITH MEALS 180 tablet 0  ? Multiple Vitamin (MULTIVITAMIN) capsule Take 1 capsule by mouth daily.    ? rosuvastatin (CRESTOR) 5 MG tablet TAKE 1 TABLET AT BEDTIME 90 tablet 0  ? triamcinolone (KENALOG) 0.1 % Apply 1 application topically at bedtime. 15 g 0  ? ?No facility-administered medications prior to visit.  ? ? ?Allergies  ?Allergen Reactions  ? Lipitor [Atorvastatin] Other (See Comments)  ?  Myalgia   ? Dorzolamide Hydrochloride [Dorzolamide] Other (See Comments)  ?  Joint pain  ? Lisinopril   ?  Throat swelling  ? Simvastatin Other (See Comments)  ?  Myalgias and joint pain.  ? Statins   ? Timolol Maleate [Timolol] Other (See Comments)  ?  Joint pain  ? Livalo [Pitavastatin] Rash  ?  Rash on face, itchy skin, joint stiffness  ? ? ?ROS ?Review of Systems ? ?  ?Objective:  ?  ?Physical Exam ?Constitutional:   ?   Appearance: Normal appearance. She is well-developed.  ?HENT:  ?   Head: Normocephalic and atraumatic.  ?Cardiovascular:  ?   Rate and Rhythm: Normal rate and regular rhythm.  ?   Heart sounds: Normal heart sounds.  ?Pulmonary:  ?   Effort: Pulmonary effort is normal.  ?   Breath sounds: Normal breath sounds.  ?Skin: ?   General: Skin is warm and dry.  ?Neurological:  ?   Mental Status: She is alert and oriented to person, place, and time.  ?Psychiatric:     ?   Behavior: Behavior normal.  ? ? ?BP 139/67   Pulse 79   Ht 5' 9"  (1.753 m)   Wt 223 lb (101.2 kg)   SpO2 96%   BMI 32.93 kg/m?  ?Wt Readings from Last 3 Encounters:  ?08/27/21 223 lb (101.2 kg)  ?03/15/21 206 lb (93.4 kg)  ?02/26/21 206 lb (93.4 kg)  ? ? ? ?Health Maintenance Due  ?Topic Date Due  ? COLONOSCOPY (Pts 45-75yr Insurance coverage will need to be confirmed)  08/16/2019  ? COVID-19 Vaccine (4 - Booster for Moderna series) 07/03/2020  ? DEXA SCAN  11/24/2020  ? ? ?There are no preventive care reminders to display for this patient. ? ?Lab Results  ?Component Value Date  ? TSH 1.03 08/22/2021  ? ?Lab Results  ?Component Value Date  ? WBC 6.8 12/17/2019  ? HGB 13.2 12/17/2019  ? HCT 39.9 12/17/2019  ? MCV 89.3 12/17/2019  ? PLT 388 12/17/2019  ? ?Lab Results  ?Component Value Date  ? NA 137 08/22/2021  ? K 4.5 08/22/2021  ? CO2 26 08/22/2021  ? GLUCOSE 111 (H) 08/22/2021  ? BUN 19 08/22/2021  ? CREATININE 0.59 08/22/2021  ? BILITOT 0.6 08/22/2021  ? ALKPHOS 75 11/25/2016  ? AST 19 08/22/2021  ? ALT 17 08/22/2021  ? PROT 6.9  08/22/2021  ? ALBUMIN 4.3 11/25/2016  ? CALCIUM 10.4 08/22/2021  ? EGFR  100 08/22/2021  ? ?Lab Results  ?Component Value Date  ? CHOL 193 08/22/2021  ? ?Lab Results  ?Component Value Date  ? HDL 76 08/22/2021  ? ?Lab Results  ?Component Value Date  ? [place] 96 08/22/2021  ? ?Lab Results  ?Component Value Date  ? TRIG 113 08/22/2021  ? ?Lab Results  ?Component Value Date  ? CHOLHDL 2.5 08/22/2021  ? ?Lab Results  ?Component Value Date  ? HGBA1C 5.8 (A) 08/27/2021  ? ? ?  ?Assessment & Plan:  ? ?Problem List Items Addressed This Visit   ? ?  ? Cardiovascular and Mediastinum  ? HTN (hypertension) - Primary  ?  Well controlled. Continue current regimen. Follow up in  6 mo  ?  ?  ?  ? Endocrine  ? IFG (impaired fasting glucose)  ?  A1C up to 6.4. discussed close to dx fo DM. Discussed options.  She is actually retiring in 2 months and says she is planning on really getting back on track with her diet.  She admits she has been indulging in sweets a little bit more than usual.  She is having a lot of problems with her right knee but is planning on getting with sports med to maybe have an injection so that she can start walking again. ? ?Lab Results  ?Component Value Date  ? HGBA1C 6.4 (H) 08/22/2021  ? ? ?  ?  ? Relevant Orders  ? POCT glycosylated hemoglobin (Hb A1C) (Completed)  ? Hypothyroidism  ?  Last TSH looked fantastic.  Continue current regimen.  She says she is very regimented about taking her medication.  Follow-up in 6 months ?  ?  ? ?Other Visit Diagnoses   ? ? Post-menopausal      ? Relevant Orders  ? DG Bone Density  ? Need for pneumococcal 20-valent conjugate vaccination      ? Relevant Orders  ? Pneumococcal conjugate vaccine 20-valent (Prevnar 20) (Completed)  ? ?  ? ? ?No orders of the defined types were placed in this encounter. ? ? ?Follow-up: Return in about 6 months (around 02/26/2022) for Hypertension and IFG.  ? ? ?Beatrice Lecher, MD ?

## 2021-08-27 NOTE — Assessment & Plan Note (Signed)
Well controlled. Continue current regimen. Follow up in  6 mo  

## 2021-08-27 NOTE — Assessment & Plan Note (Addendum)
A1C up to 6.4. discussed close to dx fo DM. Discussed options.  She is actually retiring in 2 months and says she is planning on really getting back on track with her diet.  She admits she has been indulging in sweets a little bit more than usual.  She is having a lot of problems with her right knee but is planning on getting with sports med to maybe have an injection so that she can start walking again. ? ?Lab Results  ?Component Value Date  ? HGBA1C 6.4 (H) 08/22/2021  ? ? ?

## 2021-08-27 NOTE — Assessment & Plan Note (Signed)
Last TSH looked fantastic.  Continue current regimen.  She says she is very regimented about taking her medication.  Follow-up in 6 months ?

## 2021-08-30 ENCOUNTER — Ambulatory Visit (INDEPENDENT_AMBULATORY_CARE_PROVIDER_SITE_OTHER): Payer: Medicare HMO

## 2021-08-30 DIAGNOSIS — Z1231 Encounter for screening mammogram for malignant neoplasm of breast: Secondary | ICD-10-CM

## 2021-08-30 NOTE — Progress Notes (Signed)
Please call patient. Normal mammogram.  Repeat in 1 year.  

## 2021-09-24 NOTE — Progress Notes (Signed)
? ? ?  Subjective:   ? ?CC: R Knee pain ? ?I, Wendy Poet, LAT, ATC, am serving as scribe for Dr. Lynne Leader. ? ?HPI: Pt is a 66 y/o female c/o R knee pain for a long time.  Pt notes having a R hip replacement in May 2022 and L knee replacement in Aug 2022. She locates her pain to the anterior lateral aspect of the R knee. ? ?Knee swelling: yes ?Knee mechanical symptoms: yes ?Aggravating factors: anything esp knee flexion, ?Treatments tried: IBU ? ?Diagnostic testing: R knee XR- 01/16/11 ? ?Pertinent review of Systems: No fevers or chills ? ?Relevant historical information: Hypertension.  History left total knee replacement. ? ? ?Objective:   ? ?Vitals:  ? 09/25/21 1038  ?BP: (!) 162/94  ?Pulse: 79  ?SpO2: 98%  ? ?General: Well Developed, well nourished, and in no acute distress.  ? ?MSK: Right knee: Genu valgus deformity present.  Moderate joint effusion.  Normal motion with crepitation.  Tender palpation medial joint line. ? ?Lab and Radiology Results ? ?Procedure: Real-time Ultrasound Guided Injection of right knee superior lateral patellar space ?Device: Philips Affiniti 50G ?Images permanently stored and available for review in PACS ?Verbal informed consent obtained.  Discussed risks and benefits of procedure. Warned about infection, bleeding, hyperglycemia damage to structures among others. ?Patient expresses understanding and agreement ?Time-out conducted.   ?Noted no overlying erythema, induration, or other signs of local infection.   ?Skin prepped in a sterile fashion.   ?Local anesthesia: Topical Ethyl chloride.   ?With sterile technique and under real time ultrasound guidance: 40 mg of Kenalog and 2 mL of Marcaine injected into knee joint. Fluid seen entering the joint capsule.   ?Completed without difficulty   ?Pain immediately resolved suggesting accurate placement of the medication.   ?Advised to call if fevers/chills, erythema, induration, drainage, or persistent bleeding.   ?Images permanently  stored and available for review in the ultrasound unit.  ?Impression: Technically successful ultrasound guided injection. ? ? ? ?Impression and Recommendations:   ? ?Assessment and Plan: ?66 y.o. female with right knee pain due to DJD.  Plan for repeat steroid injection today.  We checked authorization for hyaluronic acid injections and she is already preapproved for Orthovisc and Monovisc based on her insurance.  So we could switch to hyaluronic acid injections soon if this cortisone injection does not work very well.  However her best option if cortisone shots do not work well is probably a knee replacement.  She would like to wait until at least the next year if possible.  Recheck back as needed. ? ?Of note I did order a knee x-ray today but she left without getting it done I think on accident.. ? ?PDMP not reviewed this encounter. ?Orders Placed This Encounter  ?Procedures  ? [place] LIMITED JOINT SPACE STRUCTURES LOW BILAT(NO LINKED CHARGES)  ?  Order Specific Question:   Reason for Exam (SYMPTOM  OR DIAGNOSIS REQUIRED)  ?  Answer:   knee pain  ?  Order Specific Question:   Preferred imaging location?  ?  Answer:   [place]  ? ?No orders of the defined types were placed in this encounter. ? ? ?Discussed warning signs or symptoms. Please see discharge instructions. Patient expresses understanding. ? ? ?The above documentation has been reviewed and is accurate and complete Lynne Leader, M.D. ? ?

## 2021-09-25 ENCOUNTER — Ambulatory Visit: Payer: Medicare HMO | Admitting: Family Medicine

## 2021-09-25 ENCOUNTER — Ambulatory Visit: Payer: Self-pay

## 2021-09-25 VITALS — BP 162/94 | HR 79 | Ht 69.0 in | Wt 229.8 lb

## 2021-09-25 DIAGNOSIS — M25561 Pain in right knee: Secondary | ICD-10-CM

## 2021-09-25 DIAGNOSIS — M1711 Unilateral primary osteoarthritis, right knee: Secondary | ICD-10-CM | POA: Diagnosis not present

## 2021-09-25 DIAGNOSIS — M25562 Pain in left knee: Secondary | ICD-10-CM | POA: Diagnosis not present

## 2021-09-25 DIAGNOSIS — G8929 Other chronic pain: Secondary | ICD-10-CM | POA: Diagnosis not present

## 2021-09-25 NOTE — Patient Instructions (Addendum)
Thank you for coming in today.  ? ?Please get an Xray today before you leave  ? ?You received a steroid injection in your right knee today. Seek immediate medical attention if the joint becomes red, extremely painful, or is oozing fluid.  ? ?Let me know if you want to try the gel shots ? ?We can repeat the steroid injection every 3 months if needed ? ?Check back as needed ?

## 2021-09-26 ENCOUNTER — Ambulatory Visit (INDEPENDENT_AMBULATORY_CARE_PROVIDER_SITE_OTHER): Payer: Medicare HMO

## 2021-09-26 DIAGNOSIS — Z780 Asymptomatic menopausal state: Secondary | ICD-10-CM | POA: Diagnosis not present

## 2021-09-26 NOTE — Progress Notes (Signed)
Hi [name] [name], your bone density test shows a T score of 1.1 which is considered normal.  Recommend repeat bone density in 5 years.

## 2021-10-19 ENCOUNTER — Encounter: Payer: Self-pay | Admitting: Physician Assistant

## 2021-10-19 ENCOUNTER — Telehealth: Payer: Medicare HMO

## 2021-10-19 ENCOUNTER — Encounter: Payer: Self-pay | Admitting: Family Medicine

## 2021-10-19 ENCOUNTER — Telehealth (INDEPENDENT_AMBULATORY_CARE_PROVIDER_SITE_OTHER): Payer: Medicare HMO | Admitting: Physician Assistant

## 2021-10-19 VITALS — Ht 69.0 in | Wt 226.0 lb

## 2021-10-19 DIAGNOSIS — H1013 Acute atopic conjunctivitis, bilateral: Secondary | ICD-10-CM | POA: Diagnosis not present

## 2021-10-19 MED ORDER — AZELASTINE HCL 0.05 % OP SOLN: 2.0000 [drp] | Freq: Two times a day (BID) | OPHTHALMIC | 0 refills | Status: DC

## 2021-10-19 NOTE — Telephone Encounter (Signed)
Please set up a virtual today right after lunch is fine. Ok to double book.

## 2021-10-19 NOTE — Progress Notes (Signed)
Woke up Tuesday AM - blood red eyes, crusty No vision changes Does have glaucoma (takes timolol/latanoprost) Tuesday Monday blood red  watery

## 2021-10-19 NOTE — Telephone Encounter (Signed)
Left message for a return call

## 2021-10-19 NOTE — Patient Instructions (Signed)
Allergic Conjunctivitis, Adult Allergic conjunctivitis is inflammation of the conjunctiva. The conjunctiva is the thin, clear membrane that covers the white part of the eye and the inner surface of the eyelid. In this condition: The blood vessels in the conjunctiva become irritated and swell. The eyes become red or pink and feel itchy. Allergic conjunctivitis cannot be spread from person to person. This condition can develop at any age and may be outgrown. What are the causes? This condition is caused by allergens. These are things that can cause an allergic reaction in some people but not in other people. Common allergens include: Outdoor allergens, such as: Pollen, including pollen from grass and weeds. Mold spores. Car fumes. Indoor allergens, such as: Dust. Smoke. Mold spores. Proteins in a pet's urine, saliva, or dander. What increases the risk? You may be more likely to develop this condition if you have a family history of these things: Allergies. Conditions caused by being exposed to allergens, such as: Allergic rhinitis. This is an allergic reaction that affects the nose. Bronchial asthma. This condition affects the large airways in the lungs and makes breathing difficult. Atopic dermatitis (eczema). This is inflammation of the skin that is long-term (chronic). What are the signs or symptoms? Symptoms of this condition include eyes that are: Itchy. Red. Watery. Puffy. Your eyes may also: Sting or burn. Have clear fluid draining from them. Have thick mucus discharge and pain (vernal conjunctivitis). How is this diagnosed? This condition may be diagnosed by: Your medical history. A physical exam. Tests of the fluid draining from your eyes to rule out other causes. Other tests to confirm the diagnosis, including: Testing for allergies. The skin may be pricked with a tiny needle. The pricked area is then exposed to small amounts of allergens. Testing for other eye  conditions. Tests may include: Blood tests. Tissue scrapings from your eyelid. The tissue is then checked under a microscope. How is this treated? This condition may be treated with: Cold, wet cloths (cold compresses) to soothe itching and swelling. Washing the face to remove allergens. Eye drops. These may be prescription or over-the-counter. You may need to try different types to see which one works best for you, such as: Eye drops that block the allergic reaction (antihistamine). Eye drops that reduce swelling and irritation (anti-inflammatory). Steroid eye drops, which may be given if other treatments have not worked (vernal conjunctivitis). Oral antihistamine medicines. These are medicines taken by mouth to lessen your allergic reaction. You may need these if eye drops do not help or are difficult to use. Follow these instructions at home: Eye care Apply a clean, cold compress to your eyes for 10-20 minutes, 3-4 times a day. Do not touch or rub your eyes. Do not wear contact lenses until the inflammation is gone. Wear glasses instead. Do not wear eye makeup until the inflammation is gone. General instructions Avoid known allergens whenever possible. Take or apply over-the-counter and prescription medicines only as told by your health care provider. These include any eye drops. Drink enough fluid to keep your urine pale yellow. Keep all follow-up visits as told by your health care provider. This is important. Contact a health care provider if: Your symptoms get worse or do not get better with treatment. You have mild eye pain. You become sensitive to light. You have spots or blisters on your eyes. You have pus draining from your eyes. You have a fever. Get help right away if: You have redness, swelling, or other symptoms in  only one eye. Your vision is blurred or you have other vision changes. You have severe eye pain. Summary Allergic conjunctivitis is inflammation of the  clear membrane that covers the white part of the eye and the inner surface of the eyelid. Take or apply over-the-counter and prescription medicines only as told by your health care provider. These include eye drops. Do not touch or rub your eyes. Contact a health care provider if your symptoms get worse or do not get better with treatment. This information is not intended to replace advice given to you by your health care provider. Make sure you discuss any questions you have with your health care provider. Document Revised: 03/26/2019 Document Reviewed: 03/29/2019 Elsevier Patient Education  [place].

## 2021-10-19 NOTE — Progress Notes (Signed)
..  Virtual Visit via Video Note  I connected with [name] [name] on 10/19/21 at  1:00 PM EDT by a video enabled telemedicine application and verified that I am speaking with the correct person using two identifiers.  Location: Patient: home Provider: clinic  .Marland KitchenParticipating in visit:  Patient: [name] [name]  Provider: Iran Planas PA-C Provider in training: Rexanne Mano PA-S   I discussed the limitations of evaluation and management by telemedicine and the availability of in person appointments. The patient expressed understanding and agreed to proceed.  History of Present Illness: Pt is a 66 yo female with bilateral watery, itchy, red eyes since Tuesday morning. Symptoms started suddenly. Hx of glaucoma. Denies any fever, chills, sinus pressure, cough, ear pain, ST. No hx of allergies. Denies any vision changes or eye pain. No sick contacts.   .. Active Ambulatory Problems    Diagnosis Date Noted   Hypothyroidism 07/02/2007   Hyperlipidemia 07/02/2007   OBESITY 07/02/2007   HTN (hypertension) 04/04/2010   ADJUSTMENT DISORDER WITH DEPRESSED MOOD 05/23/2010   Glaucoma 10/16/2012   IFG (impaired fasting glucose) 10/16/2012   Myalgia and myositis 10/11/2015   Chronic pain of right knee 10/20/2015   Primary open angle glaucoma of both eyes, indeterminate stage 05/10/2014   H/O laser assisted in situ keratomileusis 05/10/2014   Status post cataract extraction and insertion of intraocular lens of left eye 06/24/2014   BMI 34.0-34.9,adult 05/27/2019   Primary osteoarthritis of right knee 09/25/2021   Allergic conjunctivitis of both eyes 10/19/2021   Resolved Ambulatory Problems    Diagnosis Date Noted   OSTEOARTHRITIS, HIP, LEFT 04/04/2010   MIGRAINES, HX OF 07/02/2007   TOBACCO ABUSE, HX OF 04/04/2010   Impaired fasting glucose 05/23/2010   Pain in joint, ankle and foot 10/11/2015   Preoperative clearance 08/31/2020   Primary osteoarthritis of right hip 08/31/2020   Past Medical  History:  Diagnosis Date   Migraine    Migraines    Obesity    Obesity        Observations/Objective: No acute distress No cough Normal breathing Bilateral injected conjunctiva with watery discharge    Assessment and Plan: Marland KitchenMarland KitchenMyrtha was seen today for eye problem.  Diagnoses and all orders for this visit:  Allergic conjunctivitis of both eyes -     azelastine (OPTIVAR) 0.05 % ophthalmic solution; Place 2 drops into both eyes 2 (two) times daily.  Symptoms consistent with allergic conjunctivitis Cool compresses Optivar bid Follow up as needed.     Follow Up Instructions:    I discussed the assessment and treatment plan with the patient. The patient was provided an opportunity to ask questions and all were answered. The patient agreed with the plan and demonstrated an understanding of the instructions.   The patient was advised to call back or seek an in-person evaluation if the symptoms worsen or if the condition fails to improve as anticipated.   Iran Planas, PA-C

## 2021-10-19 NOTE — Telephone Encounter (Signed)
Patient scheduled.

## 2021-11-06 ENCOUNTER — Encounter: Payer: Self-pay | Admitting: Physician Assistant

## 2021-11-06 MED ORDER — LODOXAMIDE TROMETHAMINE 0.1 % OP SOLN: 1.0000 [drp] | Freq: Four times a day (QID) | OPHTHALMIC | 1 refills | Status: DC

## 2021-11-06 MED ORDER — CROMOLYN SODIUM 4 % OP SOLN: 1.0000 [drp] | Freq: Four times a day (QID) | OPHTHALMIC | 0 refills | Status: DC

## 2021-11-06 NOTE — Telephone Encounter (Signed)
[facility] called for alternative RX to lodoxamide (ALOMIDE) 0.1 % ophthalmic solution. They no longer make this medication. Please advise.

## 2021-11-06 NOTE — Addendum Note (Signed)
Addended by: Donella Stade on: 11/06/2021 04:45 PM   Modules accepted: Orders

## 2022-01-06 ENCOUNTER — Other Ambulatory Visit: Payer: Self-pay | Admitting: Family Medicine

## 2022-01-06 DIAGNOSIS — E039 Hypothyroidism, unspecified: Secondary | ICD-10-CM

## 2022-01-06 DIAGNOSIS — I10 Essential (primary) hypertension: Secondary | ICD-10-CM

## 2022-01-06 DIAGNOSIS — R7301 Impaired fasting glucose: Secondary | ICD-10-CM

## 2022-01-06 DIAGNOSIS — E782 Mixed hyperlipidemia: Secondary | ICD-10-CM

## 2022-01-10 DIAGNOSIS — M25561 Pain in right knee: Secondary | ICD-10-CM | POA: Diagnosis not present

## 2022-01-10 DIAGNOSIS — M1711 Unilateral primary osteoarthritis, right knee: Secondary | ICD-10-CM | POA: Diagnosis not present

## 2022-01-10 DIAGNOSIS — Z96652 Presence of left artificial knee joint: Secondary | ICD-10-CM | POA: Diagnosis not present

## 2022-01-26 ENCOUNTER — Encounter: Payer: Self-pay | Admitting: Family Medicine

## 2022-02-13 DIAGNOSIS — H1789 Other corneal scars and opacities: Secondary | ICD-10-CM | POA: Diagnosis not present

## 2022-02-13 DIAGNOSIS — H40013 Open angle with borderline findings, low risk, bilateral: Secondary | ICD-10-CM | POA: Diagnosis not present

## 2022-02-13 DIAGNOSIS — H35373 Puckering of macula, bilateral: Secondary | ICD-10-CM | POA: Diagnosis not present

## 2022-02-13 DIAGNOSIS — Z961 Presence of intraocular lens: Secondary | ICD-10-CM | POA: Diagnosis not present

## 2022-02-16 ENCOUNTER — Encounter: Payer: Self-pay | Admitting: Family Medicine

## 2022-02-16 DIAGNOSIS — R7301 Impaired fasting glucose: Secondary | ICD-10-CM

## 2022-02-18 NOTE — Telephone Encounter (Signed)
BMP and A1c please

## 2022-02-19 DIAGNOSIS — R7301 Impaired fasting glucose: Secondary | ICD-10-CM | POA: Diagnosis not present

## 2022-02-20 LAB — BASIC METABOLIC PANEL WITH GFR
BUN: 17 mg/dL (ref 7–25)
CO2: 25 mmol/L (ref 20–32)
Calcium: 10.2 mg/dL (ref 8.6–10.4)
Chloride: 103 mmol/L (ref 98–110)
Creat: 0.65 mg/dL (ref 0.50–1.05)
Glucose, Bld: 120 mg/dL — ABNORMAL HIGH (ref 65–99)
Potassium: 4.4 mmol/L (ref 3.5–5.3)
Sodium: 138 mmol/L (ref 135–146)
eGFR: 97 mL/min/{1.73_m2} (ref >=60)

## 2022-02-20 LAB — HEMOGLOBIN A1C
Hgb A1c MFr Bld: 6.4 % of total Hgb — ABNORMAL HIGH (ref <5.7)
Mean Plasma Glucose: 137 mg/dL
eAG (mmol/L): 7.6 mmol/L

## 2022-02-21 NOTE — Progress Notes (Signed)
A1C up slightly from last time. Metabolic panel is normal.

## 2022-02-25 NOTE — Progress Notes (Unsigned)
   Established Patient Office Visit  Subjective   Patient ID: [name] [name], female    DOB: [birth year]  Age: 66 y.o. MRN: [id number]  No chief complaint on file.   HPI  Hypertension- Pt denies chest pain, SOB, dizziness, or heart palpitations.  Taking meds as directed w/o problems.  Denies medication side effects.     Impaired fasting glucose-no increased thirst or urination. No symptoms consistent with hypoglycemia.  {History (Optional):23778}  ROS    Objective:     There were no vitals taken for this visit. {Vitals History (Optional):23777}  Physical Exam Vitals and nursing note reviewed.  Constitutional:      Appearance: She is well-developed.  HENT:     Head: Normocephalic and atraumatic.  Cardiovascular:     Rate and Rhythm: Normal rate and regular rhythm.     Heart sounds: Normal heart sounds.  Pulmonary:     Effort: Pulmonary effort is normal.     Breath sounds: Normal breath sounds.  Skin:    General: Skin is warm and dry.  Neurological:     Mental Status: She is alert and oriented to person, place, and time.  Psychiatric:        Behavior: Behavior normal.    No results found for any visits on 02/26/22.  {Labs (Optional):23779}  The 10-year ASCVD risk score (Arnett DK, et al., 2019) is: 11.4%    Assessment & Plan:   Problem List Items Addressed This Visit       Cardiovascular and Mediastinum   HTN (hypertension)     Endocrine   IFG (impaired fasting glucose) - Primary    No follow-ups on file.    Beatrice Lecher, MD

## 2022-02-26 ENCOUNTER — Ambulatory Visit (INDEPENDENT_AMBULATORY_CARE_PROVIDER_SITE_OTHER): Payer: Medicare HMO | Admitting: Family Medicine

## 2022-02-26 VITALS — BP 155/79 | HR 78 | Ht 69.0 in | Wt 231.0 lb

## 2022-02-26 DIAGNOSIS — Z23 Encounter for immunization: Secondary | ICD-10-CM | POA: Diagnosis not present

## 2022-02-26 DIAGNOSIS — I10 Essential (primary) hypertension: Secondary | ICD-10-CM

## 2022-02-26 DIAGNOSIS — M25561 Pain in right knee: Secondary | ICD-10-CM | POA: Diagnosis not present

## 2022-02-26 DIAGNOSIS — R7301 Impaired fasting glucose: Secondary | ICD-10-CM | POA: Diagnosis not present

## 2022-02-26 DIAGNOSIS — G8929 Other chronic pain: Secondary | ICD-10-CM

## 2022-02-26 LAB — POCT GLYCOSYLATED HEMOGLOBIN (HGB A1C): Hemoglobin A1C: 6.2 % — AB (ref 4.0–5.6)

## 2022-02-26 MED ORDER — LOSARTAN POTASSIUM-HCTZ 100-25 MG PO TABS: 1.0000 | ORAL_TABLET | Freq: Every day | ORAL | 1 refills | Status: DC

## 2022-02-26 NOTE — Assessment & Plan Note (Addendum)
Uncontrolled.  We discussed options. She doesn't want to go up on the limited pain because she already feels like it causes a little bit of itching.  We discussed adding an ARB with her prediabetes history would be helpful and renal protection.  We can combine this with the HCTZ so that we do not increase the pill burden.  New prescription sent to mail order she will hopefully get this in the next couple of weeks and have her follow-up for nurse visit when she has been on it for 2 to 3 weeks.

## 2022-02-26 NOTE — Assessment & Plan Note (Signed)
EXTR to consider doing recumbent bike especially with her knee pain and working on setting a goal of twice a week for 15 minutes and then starting to build on that.

## 2022-02-26 NOTE — Assessment & Plan Note (Signed)
Well controlled. Continue current regimen. Follow up in  6 mo .  A1C is 6.2 today.

## 2022-03-03 ENCOUNTER — Encounter: Payer: Self-pay | Admitting: Family Medicine

## 2022-03-11 ENCOUNTER — Encounter: Payer: Self-pay | Admitting: Family Medicine

## 2022-03-22 ENCOUNTER — Encounter: Payer: Self-pay | Admitting: Family Medicine

## 2022-03-22 DIAGNOSIS — M1711 Unilateral primary osteoarthritis, right knee: Secondary | ICD-10-CM

## 2022-03-22 DIAGNOSIS — G8929 Other chronic pain: Secondary | ICD-10-CM

## 2022-03-25 ENCOUNTER — Telehealth: Payer: Self-pay | Admitting: *Deleted

## 2022-03-25 NOTE — Telephone Encounter (Signed)
R knee Marcha Solders, & Orthovisc initiated through portals.

## 2022-03-26 NOTE — Telephone Encounter (Signed)
R knee Orthovisc approved.

## 2022-04-09 ENCOUNTER — Ambulatory Visit (INDEPENDENT_AMBULATORY_CARE_PROVIDER_SITE_OTHER): Payer: Medicare HMO | Admitting: Family Medicine

## 2022-04-09 VITALS — BP 120/54 | HR 88 | Ht 69.0 in

## 2022-04-09 DIAGNOSIS — I10 Essential (primary) hypertension: Secondary | ICD-10-CM

## 2022-04-09 NOTE — Progress Notes (Signed)
   Established Patient Office Visit  Subjective   Patient ID: [name] [name], female    DOB: 17-Jul-1955  Age: 66 y.o. MRN: [id number]  Chief Complaint  Patient presents with   Hypertension    BP check - nurse visit    HPI  Hypertension- BP check nurse visit-  patient denies  shortness of breath, chest pain, palpitations, dizziness or problems with medication. She does states she has been having headaches off and on  but does not know if these are related to the medication or not.   ROS    Objective:     BP (!) 120/54   Pulse 88   Ht '5\' 9"'$  (1.753 m)   SpO2 (!) 88%   BMI 34.11 kg/m    Physical Exam   No results found for any visits on 04/09/22.    The 10-year ASCVD risk score (Arnett DK, et al., 2019) is: 6.4%    Assessment & Plan:  Hypertension- BP check - reading 120/54. Patient BP is at goal. Per Dr. Madilyn Fireman patient could choose to have BP med changed today or wait a few weeks to see if headaches continue. Patient was happy with BP reading and is wanting to continue med. She will contact Dr. Madilyn Fireman via [place] if she has continuing headaches.  Problem List Items Addressed This Visit   None   Return if symptoms worsen or fail to improve.    Rae Lips, LPN

## 2022-04-09 NOTE — Progress Notes (Signed)
Agree with documentation as above.   Shakeyla Giebler, MD  

## 2022-04-09 NOTE — Patient Instructions (Signed)
BP at goal today. Dr. Madilyn Fireman suggests giving Losartan/HCTZ 100/'25mg'$  a few weeks to see if headaches continue . If  a change of medication needed can send a Mychart message.

## 2022-04-18 NOTE — Progress Notes (Signed)
   I, Peterson Lombard, LAT, ATC acting as a scribe for Lynne Leader, MD.  [name] [name] is a 66 y.o. female who presents to [place] at [facility] today for cont'd chronic right knee pain due to OA. Pt was last seen by Dr. Georgina Snell on 09/25/21 and was given a R knee steroid injection. Today, pt reports prior R knee steroid injection lasting maybe 2 weeks. Pt is wanting to start on the gel shots to day and is already schedule for the next 2 visits.   Dx imaging: R knee XR ordered  Pertinent review of systems: No fevers or chills  Relevant historical information: Severe right knee osteoarthritis seen on x-ray Dr. Aurea Graff office earlier this year.   Exam:  BP 136/74   Pulse 91   Ht '5\' 9"'$  (1.753 m)   Wt 230 lb (104.3 kg)   SpO2 97%   BMI 33.97 kg/m  General: Well Developed, well nourished, and in no acute distress.   MSK: Right knee moderate effusion decreased range of motion.  Tender palpation medial joint line.  Significant antalgic gait.    Lab and Radiology Results  Orthovisc injection right knee 1/3 Procedure: Real-time Ultrasound Guided Injection of right knee superior lateral patellar space Device: Philips Affiniti 50G Images permanently stored and available for review in PACS Verbal informed consent obtained.  Discussed risks and benefits of procedure. Warned about infection, bleeding, damage to structures among others. Patient expresses understanding and agreement Time-out conducted.   Noted no overlying erythema, induration, or other signs of local infection.   Skin prepped in a sterile fashion.   Local anesthesia: Topical Ethyl chloride.   With sterile technique and under real time ultrasound guidance: Orthovisc 30 mg injected into knee joint. Fluid seen entering the joint capsule.   Completed without difficulty   Advised to call if fevers/chills, erythema, induration, drainage, or persistent bleeding.   Images permanently stored and available for  review in the ultrasound unit.  Impression: Technically successful ultrasound guided injection. Lot number: [id number]        Assessment and Plan: 66 y.o. female with right knee pain due to exacerbation of DJD.  She had severe DJD seen on x-ray earlier this year.  If this gel shot series does not work well enough she should have a knee replacement.  She agrees.  Return next week for Orthovisc right knee 2/3.   PDMP not reviewed this encounter. Orders Placed This Encounter  Procedures   [place] LIMITED JOINT SPACE STRUCTURES LOW RIGHT(NO LINKED CHARGES)    Order Specific Question:   Reason for Exam (SYMPTOM  OR DIAGNOSIS REQUIRED)    Answer:   right knee pain    Order Specific Question:   Preferred imaging location?    Answer:   [facility]   Meds ordered this encounter  Medications   Hyaluronan (ORTHOVISC) intra-articular injection 30 mg     Discussed warning signs or symptoms. Please see discharge instructions. Patient expresses understanding.   The above documentation has been reviewed and is accurate and complete Lynne Leader, M.D.

## 2022-04-19 ENCOUNTER — Ambulatory Visit: Payer: Self-pay

## 2022-04-19 ENCOUNTER — Ambulatory Visit: Payer: Medicare HMO | Admitting: Family Medicine

## 2022-04-19 VITALS — BP 136/74 | HR 91 | Ht 69.0 in | Wt 230.0 lb

## 2022-04-19 DIAGNOSIS — M1711 Unilateral primary osteoarthritis, right knee: Secondary | ICD-10-CM

## 2022-04-19 DIAGNOSIS — M25561 Pain in right knee: Secondary | ICD-10-CM | POA: Diagnosis not present

## 2022-04-19 DIAGNOSIS — G8929 Other chronic pain: Secondary | ICD-10-CM

## 2022-04-19 MED ORDER — HYALURONAN 30 MG/2ML IX SOSY
30.0000 mg | PREFILLED_SYRINGE | Freq: Once | INTRA_ARTICULAR | Status: AC
  Administered 2022-04-19: 30 mg via INTRA_ARTICULAR

## 2022-04-19 NOTE — Patient Instructions (Signed)
Thank you for coming in today.   You received an injection today. Seek immediate medical attention if the joint becomes red, extremely painful, or is oozing fluid.    We will see you the next 2 weeks for the remaining Orthovisc injections

## 2022-04-24 ENCOUNTER — Encounter: Payer: Self-pay | Admitting: Family Medicine

## 2022-04-26 ENCOUNTER — Ambulatory Visit: Payer: Self-pay

## 2022-04-26 ENCOUNTER — Ambulatory Visit (INDEPENDENT_AMBULATORY_CARE_PROVIDER_SITE_OTHER): Payer: Medicare HMO | Admitting: Family Medicine

## 2022-04-26 DIAGNOSIS — G8929 Other chronic pain: Secondary | ICD-10-CM | POA: Diagnosis not present

## 2022-04-26 DIAGNOSIS — M25561 Pain in right knee: Secondary | ICD-10-CM | POA: Diagnosis not present

## 2022-04-26 DIAGNOSIS — M1711 Unilateral primary osteoarthritis, right knee: Secondary | ICD-10-CM | POA: Diagnosis not present

## 2022-04-26 MED ORDER — HYALURONAN 30 MG/2ML IX SOSY
30.0000 mg | PREFILLED_SYRINGE | Freq: Once | INTRA_ARTICULAR | Status: AC
  Administered 2022-04-26: 30 mg via INTRA_ARTICULAR

## 2022-04-26 NOTE — Progress Notes (Signed)
[name] [name] presents to clinic today for Orthovisc injection right knee 2/3  Procedure: Real-time Ultrasound Guided Injection of right knee superior lateral patellar space Device: Philips Affiniti 50G Images permanently stored and available for review in PACS Verbal informed consent obtained.  Discussed risks and benefits of procedure. Warned about infection, bleeding, damage to structures among others. Patient expresses understanding and agreement Time-out conducted.   Noted no overlying erythema, induration, or other signs of local infection.   Skin prepped in a sterile fashion.   Local anesthesia: Topical Ethyl chloride.   With sterile technique and under real time ultrasound guidance: Orthovisc 30 mg injected into knee joint. Fluid seen entering the joint capsule.   Completed without difficulty   Advised to call if fevers/chills, erythema, induration, drainage, or persistent bleeding.   Images permanently stored and available for review in the ultrasound unit.  Impression: Technically successful ultrasound guided injection.  Lot number: [id number]  Return in 1 week for Orthovisc injection right knee 3/3

## 2022-04-26 NOTE — Patient Instructions (Signed)
Thank you for coming in today.   You received an injection today. Seek immediate medical attention if the joint becomes red, extremely painful, or is oozing fluid.   We will see you next week for the 3rd Orthovisc injection. 

## 2022-05-03 ENCOUNTER — Ambulatory Visit: Payer: Self-pay

## 2022-05-03 ENCOUNTER — Ambulatory Visit: Payer: Medicare HMO | Admitting: Family Medicine

## 2022-05-03 DIAGNOSIS — M1711 Unilateral primary osteoarthritis, right knee: Secondary | ICD-10-CM | POA: Diagnosis not present

## 2022-05-03 DIAGNOSIS — G8929 Other chronic pain: Secondary | ICD-10-CM

## 2022-05-03 DIAGNOSIS — M25561 Pain in right knee: Secondary | ICD-10-CM

## 2022-05-03 MED ORDER — HYALURONAN 30 MG/2ML IX SOSY
30.0000 mg | PREFILLED_SYRINGE | Freq: Once | INTRA_ARTICULAR | Status: AC
  Administered 2022-05-03: 30 mg via INTRA_ARTICULAR

## 2022-05-03 NOTE — Progress Notes (Signed)
Kwynn presents to clinic today for Orthovisc injection right knee 3/3  Procedure: Real-time Ultrasound Guided Injection of right knee superior lateral patellar space Device: Philips Affiniti 50G Images permanently stored and available for review in PACS Verbal informed consent obtained.  Discussed risks and benefits of procedure. Warned about infection, bleeding, damage to structures among others. Patient expresses understanding and agreement Time-out conducted.   Noted no overlying erythema, induration, or other signs of local infection.   Skin prepped in a sterile fashion.   Local anesthesia: Topical Ethyl chloride.   With sterile technique and under real time ultrasound guidance: Orthovisc 30 mg injected into knee joint. Fluid seen entering the joint capsule.   Completed without difficulty   Advised to call if fevers/chills, erythema, induration, drainage, or persistent bleeding.   Images permanently stored and available for review in the ultrasound unit.  Impression: Technically successful ultrasound guided injection.  Lot number: [id number]  Return as needed

## 2022-05-03 NOTE — Patient Instructions (Addendum)
Thank you for coming in today.   You received an injection today. Seek immediate medical attention if the joint becomes red, extremely painful, or is oozing fluid.   That completes the Orthovisc series.  Check back as needed

## 2022-06-02 ENCOUNTER — Other Ambulatory Visit: Payer: Self-pay | Admitting: Family Medicine

## 2022-06-02 DIAGNOSIS — R7301 Impaired fasting glucose: Secondary | ICD-10-CM

## 2022-06-02 DIAGNOSIS — E039 Hypothyroidism, unspecified: Secondary | ICD-10-CM

## 2022-06-02 DIAGNOSIS — E782 Mixed hyperlipidemia: Secondary | ICD-10-CM

## 2022-06-02 DIAGNOSIS — I10 Essential (primary) hypertension: Secondary | ICD-10-CM

## 2022-06-28 ENCOUNTER — Ambulatory Visit (INDEPENDENT_AMBULATORY_CARE_PROVIDER_SITE_OTHER): Payer: Medicare HMO | Admitting: Family Medicine

## 2022-06-28 DIAGNOSIS — Z0000 Encounter for general adult medical examination without abnormal findings: Secondary | ICD-10-CM | POA: Diagnosis not present

## 2022-06-28 NOTE — Patient Instructions (Signed)
[place] Maintenance Summary and Written Plan of Care  [name] [name] ,  Thank you for allowing me to perform your Medicare Annual Wellness Visit and for your ongoing commitment to your health.   Health Maintenance & Immunization History Health Maintenance  Topic Date Due   COVID-19 Vaccine (5 - 2023-24 season) 07/14/2022 (Originally 04/25/2022)   [place] FOBT  12/27/2022 (Originally 03/09/2022)   COLONOSCOPY (Pts 45-60yr Insurance coverage will need to be confirmed)  02/27/2023 (Originally 08/16/2019)   Medicare Annual Wellness (AWV)  06/29/2023   MAMMOGRAM  08/31/2023   DTaP/Tdap/Td (4 - Td or Tdap) 09/12/2027   Pneumonia Vaccine 67 Years old  Completed   INFLUENZA VACCINE  Completed   DEXA SCAN  Completed   Hepatitis C Screening  Completed   Zoster Vaccines- Shingrix  Completed   HPV VACCINES  Aged Out   Immunization History  Administered Date(s) Administered   Fluad Quad(high Dose 65+) 02/26/2022   Influenza Split 02/15/2012   Influenza Whole 03/20/2006, 04/26/2008, 02/17/2009   Influenza,inj,Quad PF,6+ Mos 02/08/2013, 03/11/2013, 01/27/2018, 02/23/2021   Influenza-Unspecified 02/14/2014, 03/06/2015, 02/13/2016, 02/11/2017, 03/04/2019, 03/10/2020   Moderna Covid-19 Vaccine Bivalent Booster 168yr& up 02/28/2022   Moderna Sars-Covid-2 Vaccination 08/01/2019, 09/03/2019, 05/08/2020, 02/28/2022   PNEUMOCOCCAL CONJUGATE-20 08/27/2021   Td 07/03/2007   Td (Adult), 2 Lf Tetanus Toxid, Preservative Free 07/03/2007   Tdap 09/11/2017   Zoster Recombinat (Shingrix) 06/16/2017, 09/02/2017    These are the patient goals that we discussed:  Goals Addressed               This Visit's Progress     Patient Stated (pt-stated)        Patient stated that she would like to loose 50 lbs         This is a list of Health Maintenance Items that are overdue or due now: Screening mammography Annual FOBT - Colorectal cancer  screening    Orders/Referrals Placed Today: No orders of the defined types were placed in this encounter.  (Contact our referral department at [phone] you have not spoken with someone about your referral appointment within the next 5 days)    Follow-up Plan Follow-up with MeHali MarryMD as planned Schedule Mammogram in April Patient will complete Annual FOBT.  Medicare wellness visit in one year.  Patient will access AVS on my chart.      Health Maintenance, Female Adopting a healthy lifestyle and getting preventive care are important in promoting health and wellness. Ask your health care provider about: The right schedule for you to have regular tests and exams. Things you can do on your own to prevent diseases and keep yourself healthy. What should I know about diet, weight, and exercise? Eat a healthy diet  Eat a diet that includes plenty of vegetables, fruits, low-fat dairy products, and lean protein. Do not eat a lot of foods that are high in solid fats, added sugars, or sodium. Maintain a healthy weight Body mass index (BMI) is used to identify weight problems. It estimates body fat based on height and weight. Your health care provider can help determine your BMI and help you achieve or maintain a healthy weight. Get regular exercise Get regular exercise. This is one of the most important things you can do for your health. Most adults should: Exercise for at least 150 minutes each week. The exercise should increase your heart rate and make you sweat (moderate-intensity exercise). Do strengthening exercises at  least twice a week. This is in addition to the moderate-intensity exercise. Spend less time sitting. Even light physical activity can be beneficial. Watch cholesterol and blood lipids Have your blood tested for lipids and cholesterol at 67 years of age, then have this test every 5 years. Have your cholesterol levels checked more often if: Your lipid  or cholesterol levels are high. You are older than 67 years of age. You are at high risk for heart disease. What should I know about cancer screening? Depending on your health history and family history, you may need to have cancer screening at various ages. This may include screening for: Breast cancer. Cervical cancer. Colorectal cancer. Skin cancer. Lung cancer. What should I know about heart disease, diabetes, and high blood pressure? Blood pressure and heart disease High blood pressure causes heart disease and increases the risk of stroke. This is more likely to develop in people who have high blood pressure readings or are overweight. Have your blood pressure checked: Every 3-5 years if you are 38-40 years of age. Every year if you are 82 years old or older. Diabetes Have regular diabetes screenings. This checks your fasting blood sugar level. Have the screening done: Once every three years after age 54 if you are at a normal weight and have a low risk for diabetes. More often and at a younger age if you are overweight or have a high risk for diabetes. What should I know about preventing infection? Hepatitis B If you have a higher risk for hepatitis B, you should be screened for this virus. Talk with your health care provider to find out if you are at risk for hepatitis B infection. Hepatitis C Testing is recommended for: Everyone born from [birth year] through 1965. Anyone with known risk factors for hepatitis C. Sexually transmitted infections (STIs) Get screened for STIs, including gonorrhea and chlamydia, if: You are sexually active and are younger than 67 years of age. You are older than 67 years of age and your health care provider tells you that you are at risk for this type of infection. Your sexual activity has changed since you were last screened, and you are at increased risk for chlamydia or gonorrhea. Ask your health care provider if you are at risk. Ask your health care  provider about whether you are at high risk for HIV. Your health care provider may recommend a prescription medicine to help prevent HIV infection. If you choose to take medicine to prevent HIV, you should first get tested for HIV. You should then be tested every 3 months for as long as you are taking the medicine. Pregnancy If you are about to stop having your period (premenopausal) and you may become pregnant, seek counseling before you get pregnant. Take 400 to 800 micrograms (mcg) of folic acid every day if you become pregnant. Ask for birth control (contraception) if you want to prevent pregnancy. Osteoporosis and menopause Osteoporosis is a disease in which the bones lose minerals and strength with aging. This can result in bone fractures. If you are 31 years old or older, or if you are at risk for osteoporosis and fractures, ask your health care provider if you should: Be screened for bone loss. Take a calcium or vitamin D supplement to lower your risk of fractures. Be given hormone replacement therapy (HRT) to treat symptoms of menopause. Follow these instructions at home: Alcohol use Do not drink alcohol if: Your health care provider tells you not to drink. You are  pregnant, may be pregnant, or are planning to become pregnant. If you drink alcohol: Limit how much you have to: 0-1 drink a day. Know how much alcohol is in your drink. In the [place] one drink equals one 12 oz bottle of beer (355 mL), one 5 oz glass of wine (148 mL), or one 1 oz glass of hard liquor (44 mL). Lifestyle Do not use any products that contain nicotine or tobacco. These products include cigarettes, chewing tobacco, and vaping devices, such as e-cigarettes. If you need help quitting, ask your health care provider. Do not use street drugs. Do not share needles. Ask your health care provider for help if you need support or information about quitting drugs. General instructions Schedule regular health, dental, and  eye exams. Stay current with your vaccines. Tell your health care provider if: You often feel depressed. You have ever been abused or do not feel safe at home. Summary Adopting a healthy lifestyle and getting preventive care are important in promoting health and wellness. Follow your health care provider's instructions about healthy diet, exercising, and getting tested or screened for diseases. Follow your health care provider's instructions on monitoring your cholesterol and blood pressure. This information is not intended to replace advice given to you by your health care provider. Make sure you discuss any questions you have with your health care provider. Document Revised: 09/25/2020 Document Reviewed: 09/25/2020 Elsevier Patient Education  [employer].

## 2022-06-28 NOTE — Progress Notes (Signed)
MEDICARE ANNUAL WELLNESS VISIT  06/28/2022  Telephone Visit Disclaimer This Medicare AWV was conducted by telephone due to national recommendations for restrictions regarding the COVID-19 Pandemic (e.g. social distancing).  I verified, using two identifiers, that I am speaking with [name] [name] or their authorized healthcare agent. I discussed the limitations, risks, security, and privacy concerns of performing an evaluation and management service by telephone and the potential availability of an in-person appointment in the future. The patient expressed understanding and agreed to proceed.  Location of Patient: Home Location of Provider (nurse):  In the office.  Subjective:    [name] [name] is a 67 y.o. female patient of Metheney, Rene Kocher, MD who had a Medicare Annual Wellness Visit today via telephone. [name] is Retired and lives alone. she does not have children. she reports that she is socially active and does interact with friends/family regularly. she is moderately physically active and enjoys playing darts, going out with friends and walking around malls and park.  Patient Care Team: Hali Marry, MD as PCP - General (Family Medicine)     06/28/2022   10:03 AM  Advanced Directives  Does Patient Have a Medical Advance Directive? Yes  Type of Advance Directive Living will;Healthcare Power of Attorney  Does patient want to make changes to medical advance directive? No - Patient declined  Copy of [place] in Chart? No - copy requested    Hospital Utilization Over the Past 12 Months: # of hospitalizations or ER visits: 0 # of surgeries: 0  Review of Systems    Patient reports that her overall health is unchanged compared to last year.  History obtained from chart review and the patient  Patient Reported Readings (BP, Pulse, CBG, Weight, etc) none  Pain Assessment Pain : No/denies pain     Current Medications & Allergies  (verified) Allergies as of 06/28/2022       Reactions   Lipitor [atorvastatin] Other (See Comments)   Myalgia   Dorzolamide Hydrochloride [dorzolamide] Other (See Comments)   Joint pain   Lisinopril    Throat swelling   Simvastatin Other (See Comments)   Myalgias and joint pain.   Statins    Timolol Maleate [timolol] Other (See Comments)   Joint pain   Livalo [pitavastatin] Rash   Rash on face, itchy skin, joint stiffness        Medication List        Accurate as of June 28, 2022 10:15 AM. If you have any questions, ask your nurse or doctor.          amLODipine 5 MG tablet Commonly known as: NORVASC TAKE 1 TABLET EVERY DAY   B-12 2500 MCG Tabs Take 1 tablet by mouth daily.   CALCIUM 600 PO Take by mouth.   Cholecalciferol 125 MCG (5000 UT) capsule Take 5,000 Units by mouth daily.   fish oil-omega-3 fatty acids 1000 MG capsule Take 1 g by mouth 2 (two) times daily.   latanoprost 0.005 % ophthalmic solution Commonly known as: XALATAN Place 1 drop into both eyes at bedtime.   levothyroxine 125 MCG tablet Commonly known as: SYNTHROID TAKE 1 TABLET EVERY DAY   losartan-hydrochlorothiazide 100-25 MG tablet Commonly known as: HYZAAR Take 1 tablet by mouth daily.   metFORMIN 500 MG tablet Commonly known as: GLUCOPHAGE TAKE 1 TABLET TWICE DAILY WITH MEALS   multivitamin capsule Take 1 capsule by mouth daily.   rosuvastatin 5 MG tablet Commonly known as: CRESTOR TAKE 1  TABLET AT BEDTIME   timolol 0.5 % ophthalmic solution Commonly known as: BETIMOL Place 1 drop into both eyes daily. AM   triamcinolone cream 0.1 % Commonly known as: KENALOG Apply 1 application topically at bedtime.        History (reviewed): Past Medical History:  Diagnosis Date   Allergy    some statins   Arthritis forever   Cataract both have been removed   Glaucoma    HTN (hypertension) 04/04/2010   Qualifier: Diagnosis of  By: Valetta Close DO, Karen     Hyperlipidemia     Hypothyroidism    Migraine    hx of   Migraines    Obesity    Obesity    Past Surgical History:  Procedure Laterality Date   ABDOMINAL HYSTERECTOMY  2005   complete   CATARACT EXTRACTION W/ INTRAOCULAR LENS IMPLANT Left 05/10/2014   dexa  08/2005   (+) 1.7   EYE SURGERY  lasik   plus 2 cataract removals   JOINT REPLACEMENT     L hip replacement 2011   migraines, resolved  2003   Right foot surgery  2000   callus removed   TOTAL ABDOMINAL HYSTERECTOMY  2005   Family History  Problem Relation Age of Onset   Heart attack Mother    CAD Mother        stents   Hyperlipidemia Mother    Arthritis Mother    Heart disease Mother    Other Father 39       stents   Hypertension Father    Arthritis Father    Diabetes Father    Heart disease Father    Arthritis Sister    Obesity Sister    Hyperlipidemia Brother    Vision loss Maternal Aunt    Social History   Socioeconomic History   Marital status: Divorced    Spouse name: Not on file   Number of children: 0   Years of education: 14   Highest education level: Associate degree: academic program  Occupational History    Employer: [employer]   Occupation: Retired  Tobacco Use   Smoking status: Former    Packs/day: 1.00    Years: 15.00    Total pack years: 15.00    Types: Cigarettes    Quit date: 05/20/1994    Years since quitting: 28.1   Smokeless tobacco: Never   Tobacco comments:    quit smoking long time ago  Vaping Use   Vaping Use: Never used  Substance and Sexual Activity   Alcohol use: Yes    Alcohol/week: 6.0 standard drinks of alcohol    Types: 3 Glasses of wine, 3 Cans of beer per week    Comment: 2-3 glasses of wine on weekends   Drug use: No   Sexual activity: Not Currently    Birth control/protection: None  Other Topics Concern   Not on file  Social History Narrative   Lives alone. She enjoys playing darts, going out with friends and walking around malls and park.   Social Determinants of  Health   Financial Resource Strain: Low Risk  (06/24/2022)   Overall Financial Resource Strain (CARDIA)    Difficulty of Paying Living Expenses: Not hard at all  Food Insecurity: No Food Insecurity (06/24/2022)   Hunger Vital Sign    Worried About Running Out of Food in the Last Year: Never true    Ran Out of Food in the Last Year: Never true  Transportation Needs: No  Transportation Needs (06/24/2022)   PRAPARE - Hydrologist (Medical): No    Lack of Transportation (Non-Medical): No  Physical Activity: Insufficiently Active (06/24/2022)   Exercise Vital Sign    Days of Exercise per Week: 2 days    Minutes of Exercise per Session: 20 min  Stress: No Stress Concern Present (06/24/2022)   [place]    Feeling of Stress : Not at all  Social Connections: Socially Isolated (06/28/2022)   Social Connection and Isolation Panel [NHANES]    Frequency of Communication with Friends and Family: Twice a week    Frequency of Social Gatherings with Friends and Family: Once a week    Attends Religious Services: Never    Marine scientist or Organizations: No    Attends Archivist Meetings: Patient refused    Marital Status: Divorced    Activities of Daily Living    06/24/2022    9:35 AM  In your present state of health, do you have any difficulty performing the following activities:  Hearing? 0  Vision? 0  Difficulty concentrating or making decisions? 0  Walking or climbing stairs? 1  Dressing or bathing? 0  Doing errands, shopping? 0  Preparing Food and eating ? N  Using the Toilet? N  In the past six months, have you accidently leaked urine? N  Do you have problems with loss of bowel control? N  Managing your Medications? N  Managing your Finances? N  Housekeeping or managing your Housekeeping? N    Patient Education/ Literacy How often do you need to have someone help you when you read  instructions, pamphlets, or other written materials from your doctor or pharmacy?: 1 - Never What is the last grade level you completed in school?: [employer]oc[employer] degree  Exercise Current Exercise Habits: Home exercise routine, Type of exercise: Other - see comments (elliptical or stationary bike), Time (Minutes): 20, Frequency (Times/Week): 2, Weekly Exercise (Minutes/Week): 40, Intensity: Moderate, Exercise limited by: None identified  Diet Patient reports consuming 3 meals a day and 1 snack(s) a day Patient reports that her primary diet is: Regular Patient reports that she does have regular access to food.   Depression Screen    06/28/2022   10:06 AM 12/22/2020    2:49 PM 05/26/2018    8:10 AM 11/17/2017    7:14 AM 02/24/2017    7:10 AM  PHQ 2/9 Scores  PHQ - 2 Score 0 1 2 0 1  PHQ- 9 Score  4 6       Fall Risk    06/28/2022   10:05 AM 06/24/2022    9:35 AM 12/22/2020    2:56 PM 01/21/2019    3:30 PM  [place] in the past year? 0 0 0 0  Number falls in past yr: 0  0 0  Injury with Fall? 0  0 0  Risk for fall due to : No Fall Risks     Follow up Falls evaluation completed;Education provided  Falls evaluation completed      Objective:  [name] [name] seemed alert and oriented and she participated appropriately during our telephone visit.  Blood Pressure Weight BMI  BP Readings from Last 3 Encounters:  04/19/22 136/74  04/09/22 (!) 120/54  02/26/22 (!) 155/79   Wt Readings from Last 3 Encounters:  04/19/22 230 lb (104.3 kg)  02/26/22 231 lb (104.8 kg)  10/19/21 226 lb (102.5  kg)   BMI Readings from Last 1 Encounters:  04/19/22 33.97 kg/m    *Unable to obtain current vital signs, weight, and BMI due to telephone visit type  Hearing/Vision  [name] [name] did not seem to have difficulty with hearing/understanding during the telephone conversation Reports that she has had a formal eye exam by an eye care professional within the past year Reports that she has not had a formal  hearing evaluation within the past year *Unable to fully assess hearing and vision during telephone visit type  Cognitive Function:    06/28/2022   10:08 AM  6CIT Screen  What Year? 0 points  What month? 0 points  What time? 0 points  Count back from 20 0 points  Months in reverse 0 points  Repeat phrase 0 points  Total Score 0 points   (Normal:0-7, Significant for Dysfunction: >8)  Normal Cognitive Function Screening: Yes   Immunization & Health Maintenance Record Immunization History  Administered Date(s) Administered   Fluad Quad(high Dose 65+) 02/26/2022   Influenza Split 02/15/2012   Influenza Whole 03/20/2006, 04/26/2008, 02/17/2009   Influenza,inj,Quad PF,6+ Mos 02/08/2013, 03/11/2013, 01/27/2018, 02/23/2021   Influenza-Unspecified 02/14/2014, 03/06/2015, 02/13/2016, 02/11/2017, 03/04/2019, 03/10/2020   Moderna Covid-19 Vaccine Bivalent Booster 110yr & up 02/28/2022   Moderna Sars-Covid-2 Vaccination 08/01/2019, 09/03/2019, 05/08/2020, 02/28/2022   PNEUMOCOCCAL CONJUGATE-20 08/27/2021   Td 07/03/2007   Td (Adult), 2 Lf Tetanus Toxid, Preservative Free 07/03/2007   Tdap 09/11/2017   Zoster Recombinat (Shingrix) 06/16/2017, 09/02/2017    Health Maintenance  Topic Date Due   COVID-19 Vaccine (5 - 2023-24 season) 07/14/2022 (Originally 04/25/2022)   CBensonFOBT  12/27/2022 (Originally 03/09/2022)   COLONOSCOPY (Pts 45-416yrInsurance coverage will need to be confirmed)  02/27/2023 (Originally 08/16/2019)   Medicare Annual Wellness (AWV)  06/29/2023   MAMMOGRAM  08/31/2023   DTaP/Tdap/Td (4 - Td or Tdap) 09/12/2027   Pneumonia Vaccine 6574Years old  Completed   INFLUENZA VACCINE  Completed   DEXA SCAN  Completed   Hepatitis C Screening  Completed   Zoster Vaccines- Shingrix  Completed   HPV VACCINES  Aged Out       Assessment  This is a routine wellness examination for [name] [name] Health Maintenance: Due or Overdue There are no  preventive care reminders to display for this patient.   [name] [name] does not need a referral for [employer] Companyssistance: Care Management:   no Social Work:    no Prescription Assistance:  no Nutrition/Diabetes Education:  no   Plan:  Personalized Goals  Goals Addressed               This Visit's Progress     Patient Stated (pt-stated)        Patient stated that she would like to loose 50 lbs       Personalized Health Maintenance & Screening Recommendations  Screening mammography Annual FOBT  - Colorectal cancer screening  Lung Cancer Screening Recommended: no (Low Dose CT Chest recommended if Age 67-80ears, 30 pack-year currently smoking OR have quit w/in past 15 years) Hepatitis C Screening recommended: no HIV Screening recommended: no  Advanced Directives: Written information was not prepared per patient's request.  Referrals & Orders No orders of the defined types were placed in this encounter.   Follow-up Plan Follow-up with MeHali MarryMD as planned Schedule Mammogram in April Patient will complete Annual FOBT.  Medicare wellness visit in one year.  Patient will access AVS on  my chart.   I have personally reviewed and noted the following in the patient's chart:   Medical and social history Use of alcohol, tobacco or illicit drugs  Current medications and supplements Functional ability and status Nutritional status Physical activity Advanced directives List of other physicians Hospitalizations, surgeries, and ER visits in previous 12 months Vitals Screenings to include cognitive, depression, and falls Referrals and appointments  In addition, I have reviewed and discussed with [name] [name] certain preventive protocols, quality metrics, and best practice recommendations. A written personalized care plan for preventive services as well as general preventive health recommendations is available and can be mailed to the patient at her  request.      Tinnie Gens, RN BSN  06/28/2022

## 2022-07-11 ENCOUNTER — Other Ambulatory Visit (INDEPENDENT_AMBULATORY_CARE_PROVIDER_SITE_OTHER): Payer: Medicare HMO | Admitting: *Deleted

## 2022-07-11 DIAGNOSIS — Z1211 Encounter for screening for malignant neoplasm of colon: Secondary | ICD-10-CM

## 2022-07-11 LAB — POC HEMOCCULT BLD/STL (HOME/3-CARD/SCREEN)
Card #2 Fecal Occult Blod, POC: NEGATIVE
Card #3 Fecal Occult Blood, POC: NEGATIVE
Fecal Occult Blood, POC: NEGATIVE

## 2022-07-17 ENCOUNTER — Other Ambulatory Visit: Payer: Self-pay | Admitting: Family Medicine

## 2022-07-17 DIAGNOSIS — Z1231 Encounter for screening mammogram for malignant neoplasm of breast: Secondary | ICD-10-CM

## 2022-07-22 ENCOUNTER — Other Ambulatory Visit: Payer: Self-pay | Admitting: Family Medicine

## 2022-07-30 DIAGNOSIS — H524 Presbyopia: Secondary | ICD-10-CM | POA: Diagnosis not present

## 2022-08-20 ENCOUNTER — Encounter: Payer: Self-pay | Admitting: Family Medicine

## 2022-08-23 DIAGNOSIS — Z0100 Encounter for examination of eyes and vision without abnormal findings: Secondary | ICD-10-CM | POA: Diagnosis not present

## 2022-08-28 ENCOUNTER — Encounter: Payer: Self-pay | Admitting: Family Medicine

## 2022-08-28 DIAGNOSIS — R7301 Impaired fasting glucose: Secondary | ICD-10-CM

## 2022-08-28 DIAGNOSIS — E039 Hypothyroidism, unspecified: Secondary | ICD-10-CM

## 2022-08-28 DIAGNOSIS — I10 Essential (primary) hypertension: Secondary | ICD-10-CM

## 2022-08-28 DIAGNOSIS — E782 Mixed hyperlipidemia: Secondary | ICD-10-CM

## 2022-08-28 NOTE — Telephone Encounter (Signed)
Orders Placed This Encounter  Procedures   Lipid Panel w/reflex Direct LDL   COMPLETE METABOLIC PANEL WITH GFR   CBC   TSH

## 2022-09-02 DIAGNOSIS — E782 Mixed hyperlipidemia: Secondary | ICD-10-CM | POA: Diagnosis not present

## 2022-09-02 DIAGNOSIS — R7301 Impaired fasting glucose: Secondary | ICD-10-CM | POA: Diagnosis not present

## 2022-09-02 DIAGNOSIS — I10 Essential (primary) hypertension: Secondary | ICD-10-CM | POA: Diagnosis not present

## 2022-09-02 DIAGNOSIS — E039 Hypothyroidism, unspecified: Secondary | ICD-10-CM | POA: Diagnosis not present

## 2022-09-02 LAB — TSH: TSH: 1.29 mIU/L (ref 0.40–4.50)

## 2022-09-02 LAB — CBC
MCV: 89.3 fL (ref 80.0–100.0)
Platelets: 352 10*3/uL (ref 140–400)
RBC: 4.96 10*6/uL (ref 3.80–5.10)
WBC: 6 10*3/uL (ref 3.8–10.8)

## 2022-09-03 LAB — COMPLETE METABOLIC PANEL WITH GFR
AG Ratio: 2 (calc) (ref 1.0–2.5)
ALT: 21 U/L (ref 6–29)
AST: 17 U/L (ref 10–35)
Albumin: 4.7 g/dL (ref 3.6–5.1)
Alkaline phosphatase (APISO): 86 U/L (ref 37–153)
BUN: 18 mg/dL (ref 7–25)
CO2: 27 mmol/L (ref 20–32)
Calcium: 10.4 mg/dL (ref 8.6–10.4)
Chloride: 103 mmol/L (ref 98–110)
Creat: 0.71 mg/dL (ref 0.50–1.05)
Globulin: 2.4 g/dL (calc) (ref 1.9–3.7)
Glucose, Bld: 120 mg/dL — ABNORMAL HIGH (ref 65–99)
Potassium: 4.5 mmol/L (ref 3.5–5.3)
Sodium: 140 mmol/L (ref 135–146)
Total Bilirubin: 0.5 mg/dL (ref 0.2–1.2)
Total Protein: 7.1 g/dL (ref 6.1–8.1)
eGFR: 94 mL/min/{1.73_m2} (ref >=60)

## 2022-09-03 LAB — LIPID PANEL W/REFLEX DIRECT LDL
Cholesterol: 163 mg/dL (ref <200)
HDL: 70 mg/dL (ref >=50)
LDL Cholesterol (Calc): 69 mg/dL (calc)
Non-HDL Cholesterol (Calc): 93 mg/dL (calc) (ref <130)
Total CHOL/HDL Ratio: 2.3 (calc) (ref <5.0)
Triglycerides: 161 mg/dL — ABNORMAL HIGH (ref <150)

## 2022-09-03 LAB — CBC
HCT: 44.3 % (ref 35.0–45.0)
Hemoglobin: 14.4 g/dL (ref 11.7–15.5)
MCH: 29 pg (ref 27.0–33.0)
MCHC: 32.5 g/dL (ref 32.0–36.0)
MPV: 10 fL (ref 7.5–12.5)
RDW: 13.2 % (ref 11.0–15.0)

## 2022-09-03 NOTE — Progress Notes (Signed)
Hi [name] [name], LDL cholesterol looks great this go around.  Triglycerides are still up a little bit.  Your metabolic panel and blood count are normal.  Thyroid is also normal.

## 2022-09-04 ENCOUNTER — Ambulatory Visit (INDEPENDENT_AMBULATORY_CARE_PROVIDER_SITE_OTHER): Payer: Medicare HMO

## 2022-09-04 ENCOUNTER — Ambulatory Visit (INDEPENDENT_AMBULATORY_CARE_PROVIDER_SITE_OTHER): Payer: Medicare HMO | Admitting: Family Medicine

## 2022-09-04 ENCOUNTER — Encounter: Payer: Self-pay | Admitting: Family Medicine

## 2022-09-04 VITALS — BP 135/60 | HR 83 | Ht 69.0 in | Wt 227.0 lb

## 2022-09-04 DIAGNOSIS — R7301 Impaired fasting glucose: Secondary | ICD-10-CM | POA: Diagnosis not present

## 2022-09-04 DIAGNOSIS — H409 Unspecified glaucoma: Secondary | ICD-10-CM

## 2022-09-04 DIAGNOSIS — E782 Mixed hyperlipidemia: Secondary | ICD-10-CM

## 2022-09-04 DIAGNOSIS — I10 Essential (primary) hypertension: Secondary | ICD-10-CM

## 2022-09-04 DIAGNOSIS — Z1231 Encounter for screening mammogram for malignant neoplasm of breast: Secondary | ICD-10-CM

## 2022-09-04 DIAGNOSIS — E039 Hypothyroidism, unspecified: Secondary | ICD-10-CM | POA: Diagnosis not present

## 2022-09-04 LAB — POCT GLYCOSYLATED HEMOGLOBIN (HGB A1C): Hemoglobin A1C: 6.2 % — AB (ref 4.0–5.6)

## 2022-09-04 NOTE — Assessment & Plan Note (Signed)
Followed by ophthalmology.  On timolol.

## 2022-09-04 NOTE — Progress Notes (Signed)
Established Patient Office Visit  Subjective   Patient ID: [name] [name], female    DOB: 06-20-1955  Age: 67 y.o. MRN: [id number]  Chief Complaint  Patient presents with   ifg   Hypertension   Hypothyroidism    HPI Hypertension- Pt denies chest pain, SOB, dizziness, or heart palpitations.  Taking meds as directed w/o problems.  Denies medication side effects.    Hypothyroidism - Taking medication regularly in the AM away from food and vitamins, etc. No recent change to skin, hair, or energy levels.  Impaired fasting glucose-no increased thirst or urination. No symptoms consistent with hypoglycemia.  She will be moving to [place] after Labor Day and plans on finding a PCP when she gets down there.  She is on her condo here locally.  He has been trying to eat better and portion a little bit more.  She has lost about 3 pounds.  And her cholesterol actually came down significantly she just had her labs earlier this week.  Had her mammogram done this morning.  Patient Active Problem List   Diagnosis Date Noted   Allergic conjunctivitis of both eyes 10/19/2021   Primary osteoarthritis of right knee 09/25/2021   BMI 33.0-33.9,adult 05/27/2019   Chronic pain of right knee 10/20/2015   Myalgia and myositis 10/11/2015   Status post cataract extraction and insertion of intraocular lens of left eye 06/24/2014   Primary open angle glaucoma of both eyes, indeterminate stage 05/10/2014   H/O laser assisted in situ keratomileusis 05/10/2014   Glaucoma 10/16/2012   IFG (impaired fasting glucose) 10/16/2012   ADJUSTMENT DISORDER WITH DEPRESSED MOOD 05/23/2010   HTN (hypertension) 04/04/2010   Hypothyroidism 07/02/2007   Hyperlipidemia 07/02/2007   OBESITY 07/02/2007       ROS    Objective:     BP 135/60   Pulse 83   Ht  (1.753 m)   Wt 227 lb (103 kg)   SpO2 96%   BMI 33.52 kg/m    Physical Exam Vitals and nursing note reviewed.  Constitutional:      Appearance:  She is well-developed.  HENT:     Head: Normocephalic and atraumatic.  Cardiovascular:     Rate and Rhythm: Normal rate and regular rhythm.     Heart sounds: Normal heart sounds.  Pulmonary:     Effort: Pulmonary effort is normal.     Breath sounds: Normal breath sounds.  Skin:    General: Skin is warm and dry.  Neurological:     Mental Status: She is alert and oriented to person, place, and time.  Psychiatric:        Behavior: Behavior normal.      Results for orders placed or performed in visit on 09/04/22  POCT glycosylated hemoglobin (Hb A1C)  Result Value Ref Range   Hemoglobin A1C 6.2 (A) 4.0 - 5.6 %   HbA1c POC (<> result, manual entry)     HbA1c, POC (prediabetic range)     HbA1c, POC (controlled diabetic range)        The 10-year ASCVD risk score (Arnett DK, et al., 2019) is: 7.6%    Assessment & Plan:   Problem List Items Addressed This Visit       Cardiovascular and Mediastinum   HTN (hypertension) - Primary    Well controlled. Continue current regimen.  Labs are up-to-date.  Repeat due in 6 months.        Endocrine   IFG (impaired fasting glucose)  Well controlled and very stable at 6.2.  Continue metformin.  Continue to work on healthy diet and regular exercise. Follow up in  6 mo   Lab Results  Component Value Date   HGBA1C 6.2 (A) 09/04/2022        Relevant Orders   POCT glycosylated hemoglobin (Hb A1C) (Completed)   Hypothyroidism    Symptoms are well-controlled.  CSH at goal.  Follow-up in 6 months.  Lab Results  Component Value Date   TSH 1.29 09/02/2022          Other   Hyperlipidemia     Continue Crestor. LDL with significant improvement compared to last year.  She has been trying to eat more healthy and is down a few pounds which is fantastic.  Plan to recheck again in 1 year.  Lab Results  Component Value Date   CHOL 163 09/02/2022   CHOL 193 08/22/2021   CHOL 171 02/14/2021   Lab Results  Component Value Date    HDL 70 09/02/2022   HDL 76 08/22/2021   HDL 72 02/14/2021   Lab Results  Component Value Date   LDLCALC 69 09/02/2022   LDLCALC 96 08/22/2021   LDLCALC 76 02/14/2021   Lab Results  Component Value Date   TRIG 161 (H) 09/02/2022   TRIG 113 08/22/2021   TRIG 157 (H) 02/14/2021   Lab Results  Component Value Date   CHOLHDL 2.3 09/02/2022   CHOLHDL 2.5 08/22/2021   CHOLHDL 2.4 02/14/2021   Lab Results  Component Value Date   LDLDIRECT 156 (H) 09/11/2017   LDLDIRECT 139 (H) 08/29/2011        Glaucoma    Followed by ophthalmology.  On timolol.       No follow-ups on file.    Nani Gasser, MD

## 2022-09-04 NOTE — Assessment & Plan Note (Addendum)
  Continue Crestor. LDL with significant improvement compared to last year.  She has been trying to eat more healthy and is down a few pounds which is fantastic.  Plan to recheck again in 1 year.  Lab Results  Component Value Date   CHOL 163 09/02/2022   CHOL 193 08/22/2021   CHOL 171 02/14/2021   Lab Results  Component Value Date   HDL 70 09/02/2022   HDL 76 08/22/2021   HDL 72 02/14/2021   Lab Results  Component Value Date   LDLCALC 69 09/02/2022   LDLCALC 96 08/22/2021   LDLCALC 76 02/14/2021   Lab Results  Component Value Date   TRIG 161 (H) 09/02/2022   TRIG 113 08/22/2021   TRIG 157 (H) 02/14/2021   Lab Results  Component Value Date   CHOLHDL 2.3 09/02/2022   CHOLHDL 2.5 08/22/2021   CHOLHDL 2.4 02/14/2021   Lab Results  Component Value Date   LDLDIRECT 156 (H) 09/11/2017   LDLDIRECT 139 (H) 08/29/2011

## 2022-09-04 NOTE — Assessment & Plan Note (Addendum)
Well controlled and very stable at 6.2.  Continue metformin.  Continue to work on healthy diet and regular exercise. Follow up in  6 mo   Lab Results  Component Value Date   HGBA1C 6.2 (A) 09/04/2022

## 2022-09-04 NOTE — Assessment & Plan Note (Addendum)
Well controlled. Continue current regimen.  Labs are up-to-date.  Repeat due in 6 months.

## 2022-09-04 NOTE — Assessment & Plan Note (Signed)
Symptoms are well-controlled.  CSH at goal.  Follow-up in 6 months.  Lab Results  Component Value Date   TSH 1.29 09/02/2022

## 2022-09-05 ENCOUNTER — Encounter: Payer: Self-pay | Admitting: Family Medicine

## 2022-09-05 NOTE — Progress Notes (Signed)
Hi [name] [name], your mammogram showed a questionable area in your right breast so the imaging department will be contacting you they like to get you scheduled for an ultrasound and a diagnostic view of that breast tissue.

## 2022-09-06 ENCOUNTER — Other Ambulatory Visit: Payer: Self-pay | Admitting: Family Medicine

## 2022-09-06 DIAGNOSIS — R928 Other abnormal and inconclusive findings on diagnostic imaging of breast: Secondary | ICD-10-CM

## 2022-09-16 ENCOUNTER — Other Ambulatory Visit: Payer: Self-pay | Admitting: Family Medicine

## 2022-09-16 ENCOUNTER — Ambulatory Visit
Admission: RE | Admit: 2022-09-16 | Discharge: 2022-09-16 | Disposition: A | Discharge status: Home / Self Care | Payer: Medicare HMO | Source: Ambulatory Visit | Attending: Family Medicine | Admitting: Family Medicine

## 2022-09-16 DIAGNOSIS — R922 Inconclusive mammogram: Secondary | ICD-10-CM | POA: Diagnosis not present

## 2022-09-16 DIAGNOSIS — R928 Other abnormal and inconclusive findings on diagnostic imaging of breast: Secondary | ICD-10-CM

## 2022-09-16 DIAGNOSIS — N6011 Diffuse cystic mastopathy of right breast: Secondary | ICD-10-CM | POA: Diagnosis not present

## 2022-09-16 DIAGNOSIS — N6489 Other specified disorders of breast: Secondary | ICD-10-CM

## 2022-09-18 ENCOUNTER — Ambulatory Visit
Admission: RE | Admit: 2022-09-18 | Discharge: 2022-09-18 | Disposition: A | Discharge status: Home / Self Care | Payer: Medicare HMO | Source: Ambulatory Visit | Attending: Family Medicine | Admitting: Family Medicine

## 2022-09-18 ENCOUNTER — Telehealth: Payer: Self-pay

## 2022-09-18 ENCOUNTER — Other Ambulatory Visit: Payer: Medicare HMO

## 2022-09-18 DIAGNOSIS — C50811 Malignant neoplasm of overlapping sites of right female breast: Secondary | ICD-10-CM | POA: Diagnosis not present

## 2022-09-18 DIAGNOSIS — N6489 Other specified disorders of breast: Secondary | ICD-10-CM

## 2022-09-18 DIAGNOSIS — R928 Other abnormal and inconclusive findings on diagnostic imaging of breast: Secondary | ICD-10-CM | POA: Diagnosis not present

## 2022-09-18 HISTORY — PX: BREAST BIOPSY: SHX20

## 2022-09-18 NOTE — Telephone Encounter (Signed)
[name] [name], CMA  [place], [place], [place] Gelsyn-3 Last inj (#3/3) 05/03/22, can repeat 11/03/22. Run VOB

## 2022-09-18 NOTE — Telephone Encounter (Signed)
VOB initiated for Gelsyn for RIGHT knee OA.

## 2022-09-21 NOTE — Telephone Encounter (Signed)
Prior Auth required for GELSYN-3 - Non-preferred product  Viscosupplements Durolane Preferred [id number] Monovisc Preferred [id number] Orthovisc Preferred [id number] Supartz FX Preferred [id number] Synvisc One Preferred J7325 Euflexxa Nonpreferred J7323 Gel-One Nonpreferred J7326 Gelsyn-3 Nonpreferred Z6109 GenVisc 850 Nonpreferred J7320 Hyalgan Nonpreferred J7321 Hymovis Nonpreferred J7322 Sodium Hyaluronate Nonpreferred C9399, J3490 Synvisc Nonpreferred U0454 SynoJoynt Nonpreferred U9811 Triluron Nonpreferred B1478 TriVisc Nonpreferred G9562 Visco-3 Nonpreferred Z3086

## 2022-09-23 ENCOUNTER — Other Ambulatory Visit: Payer: Self-pay | Admitting: General Surgery

## 2022-09-23 DIAGNOSIS — C50511 Malignant neoplasm of lower-outer quadrant of right female breast: Secondary | ICD-10-CM

## 2022-09-23 DIAGNOSIS — Z170 Estrogen receptor positive status [ER+]: Secondary | ICD-10-CM | POA: Diagnosis not present

## 2022-09-23 NOTE — Telephone Encounter (Signed)
[place] of [place].

## 2022-09-24 NOTE — Therapy (Signed)
OUTPATIENT PHYSICAL THERAPY BREAST CANCER BASELINE EVALUATION   Patient Name: [name] [name] MRN: [id number] DOB:1956/05/07, 67 y.o., female Today's Date: 09/25/2022  END OF SESSION:  PT End of Session - 09/25/22 1557     Visit Number 1    Number of Visits 2    Date for PT Re-Evaluation 11/06/22    PT Start Time 1557    PT Stop Time 1645    PT Time Calculation (min) 48 min    Activity Tolerance Patient tolerated treatment well    Behavior During Therapy [facility] for tasks assessed/performed             Past Medical History:  Diagnosis Date   Allergy    some statins   Arthritis forever   Cataract both have been removed   Glaucoma    HTN (hypertension) 04/04/2010   Qualifier: Diagnosis of  By: Cathey Endow DO, Karen     Hyperlipidemia    Hypothyroidism    Migraine    hx of   Migraines    Obesity    Obesity    Past Surgical History:  Procedure Laterality Date   ABDOMINAL HYSTERECTOMY  2005   complete   BREAST BIOPSY Right 09/18/2022   MM RT BREAST BX W LOC DEV 1ST LESION IMAGE BX SPEC STEREO GUIDE 09/18/2022 [facility]   CATARACT EXTRACTION W/ INTRAOCULAR LENS IMPLANT Left 05/10/2014   dexa  08/2005   (+) 1.7   EYE SURGERY  lasik   plus 2 cataract removals   JOINT REPLACEMENT     L hip replacement 2011   migraines, resolved  2003   Right foot surgery  2000   callus removed   TOTAL ABDOMINAL HYSTERECTOMY  2005   Patient Active Problem List   Diagnosis Date Noted   Allergic conjunctivitis of both eyes 10/19/2021   Primary osteoarthritis of right knee 09/25/2021   BMI 33.0-33.9,adult 05/27/2019   Chronic pain of right knee 10/20/2015   Myalgia and myositis 10/11/2015   Status post cataract extraction and insertion of intraocular lens of left eye 06/24/2014   Primary open angle glaucoma of both eyes, indeterminate stage 05/10/2014   H/O laser assisted in situ keratomileusis 05/10/2014   Glaucoma 10/16/2012   IFG (impaired fasting glucose) 10/16/2012    ADJUSTMENT DISORDER WITH DEPRESSED MOOD 05/23/2010   HTN (hypertension) 04/04/2010   Hypothyroidism 07/02/2007   Hyperlipidemia 07/02/2007   OBESITY 07/02/2007     REFERRING PROVIDER: Emelia Loron, MD  REFERRING DIAG: Right Breast Cancer  THERAPY DIAG:  Malignant neoplasm of lower-outer quadrant of right breast of female, estrogen receptor positive (HCC)  Abnormal posture  Rationale for Evaluation and Treatment: Rehabilitation  ONSET DATE: 09/20/2022  SUBJECTIVE:  SUBJECTIVE STATEMENT: Patient reports she is here today to be seen by her medical team for her newly diagnosed right breast cancer.   PERTINENT HISTORY:  Patient was diagnosed on 09/20/2022 with right grade 2 Invasive Mammary Carcinoma. It measures .7 cm and is located in the lower outer quadrant. It is ER+, PR+, HER 2 + with a Ki67 of 10%.   PATIENT GOALS:   reduce lymphedema risk and learn post op HEP.   PAIN:  Are you having pain? No  PRECAUTIONS: Active CA   HAND DOMINANCE: right  WEIGHT BEARING RESTRICTIONS: No  FALLS:  Has patient fallen in last 6 months? No  LIVING ENVIRONMENT: Patient lives with: alone Lives in: House/apartment Has following equipment at home: None  OCCUPATION: retired  LEISURE: reading, puzzles, darts  PRIOR LEVEL OF FUNCTION: Independent   OBJECTIVE:  COGNITION: Overall cognitive status: Within functional limits for tasks assessed    POSTURE:  Forward head and rounded shoulders posture  UPPER EXTREMITY AROM/PROM:  A/PROM RIGHT   eval   Shoulder extension 52  Shoulder flexion 155  Shoulder abduction 178  Shoulder internal rotation 56  Shoulder external rotation 94    (Blank rows = not tested)  A/PROM LEFT   eval  Shoulder extension 54  Shoulder flexion 152  Shoulder  abduction 176  Shoulder internal rotation 64  Shoulder external rotation 94    (Blank rows = not tested)  CERVICAL AROM: All within functional limits:     UPPER EXTREMITY STRENGTH: WNL  LYMPHEDEMA ASSESSMENTS:   LANDMARK RIGHT   eval  10 cm proximal to olecranon process 29.9  Olecranon process 26.8  10 cm proximal to ulnar styloid process 22.9  Just proximal to ulnar styloid process 16.9  Across hand at thumb web space 20.0  At base of 2nd digit 6.6  (Blank rows = not tested)  LANDMARK LEFT   eval  10 cm proximal to olecranon process 32.2  Olecranon process 26.6  10 cm proximal to ulnar styloid process 22.5  Just proximal to ulnar styloid process 17.0  Across hand at thumb web space 19.8  At base of 2nd digit 6.6  (Blank rows = not tested)  L-DEX LYMPHEDEMA SCREENING:  The patient was assessed using the L-Dex machine today to produce a lymphedema index baseline score. The patient will be reassessed on a regular basis (typically every 3 months) to obtain new L-Dex scores. If the score is > 6.5 points away from his/her baseline score indicating onset of subclinical lymphedema, it will be recommended to wear a compression garment for 4 weeks, 12 hours per day and then be reassessed. If the score continues to be > 6.5 points from baseline at reassessment, we will initiate lymphedema treatment. Assessing in this manner has a 95% rate of preventing clinically significant lymphedema.   L-DEX FLOWSHEETS - 09/25/22 1600       L-DEX LYMPHEDEMA SCREENING   Measurement Type Unilateral    L-DEX MEASUREMENT EXTREMITY Upper Extremity    POSITION  Standing    DOMINANT SIDE Right    At Risk Side Right    BASELINE SCORE (UNILATERAL) -6.5             QUICK DASH SURVEY: 0%  PATIENT EDUCATION:  Education details: Lymphedema risk reduction and post op shoulder/posture HEP, SOZO screens, ABC class Person educated: Patient Education method: Explanation, Demonstration,  Handout Education comprehension: Patient verbalized understanding and returned demonstration  HOME EXERCISE PROGRAM: Patient was instructed today in a home exercise  program today for post op shoulder range of motion. These included active assist shoulder flexion in sitting,supine, scapular retraction, wall walking with shoulder abduction, and hands behind head external rotation in sitting/supine.  She was encouraged to do these twice a day, holding 3 seconds and repeating 5 times when permitted by her physician.   ASSESSMENT:  CLINICAL IMPRESSION: Pts. multidisciplinary medical team met prior to her assessments to determine a recommended treatment plan. She is planning to have a right Lumpectomy with SLNB on 10/07/2022. She will benefit from a post op PT reassessment to determine needs and from L-Dex screens every 3 months for 2 years to detect subclinical lymphedema. She is planning to move to [place] to care for her aging parents but is not sure when she will be doing that. We may need to find someplace for her to have follow up SOZO screens.  Pt will benefit from skilled therapeutic intervention to improve on the following deficits: Decreased knowledge of precautions, impaired UE functional use, pain, decreased ROM, postural dysfunction.   PT treatment/interventions: ADL/self-care home management, pt/family education, therapeutic exercise  REHAB POTENTIAL: Excellent  CLINICAL DECISION MAKING: Stable/uncomplicated  EVALUATION COMPLEXITY: Low   GOALS: Goals reviewed with patient? YES  LONG TERM GOALS: (STG=LTG)    Name Target Date Goal status  1 Pt will be able to verbalize understanding of pertinent lymphedema risk reduction practices relevant to her dx specifically related to skin care.  Baseline:  No knowledge 09/25/2022 Achieved at eval  2 Pt will be able to return demo and/or verbalize understanding of the post op HEP related to regaining shoulder ROM. Baseline:  No knowledge  09/25/2022 Achieved at eval  3 Pt will be able to verbalize understanding of the importance of attending the post op After Breast CA Class for further lymphedema risk reduction education and therapeutic exercise.  Baseline:  No knowledge 09/25/2022 Achieved at eval  4 Pt will demo she has regained full shoulder ROM and function post operatively compared to baselines.  Baseline: See objective measurements taken today. 11/06/2022     PLAN:  PT FREQUENCY/DURATION: EVAL and 1 follow up appointment.   PLAN FOR NEXT SESSION: will reassess 3-4 weeks post op to determine needs. Set up SOZO screen, ABC class   Patient will follow up at outpatient cancer rehab 3-4 weeks following surgery.  If the patient requires physical therapy at that time, a specific plan will be dictated and sent to the referring physician for approval. The patient was educated today on appropriate basic range of motion exercises to begin post operatively and the importance of attending the After Breast Cancer class following surgery.  Patient was educated today on lymphedema risk reduction practices as it pertains to recommendations that will benefit the patient immediately following surgery.  She verbalized good understanding.    Physical Therapy Information for After Breast Cancer Surgery/Treatment:  Lymphedema is a swelling condition that you may be at risk for in your arm if you have lymph nodes removed from the armpit area.  After a sentinel node biopsy, the risk is approximately 5-9% and is higher after an axillary node dissection.  There is treatment available for this condition and it is not life-threatening.  Contact your physician or physical therapist with concerns. You may begin the 4 shoulder/posture exercises (see additional sheet) when permitted by your physician (typically a week after surgery).  If you have drains, you may need to wait until those are removed before beginning range of motion exercises.  A  general  recommendation is to not lift your arms above shoulder height until drains are removed.  These exercises should be done to your tolerance and gently.  This is not a "no pain/no gain" type of recovery so listen to your body and stretch into the range of motion that you can tolerate, stopping if you have pain.  If you are having immediate reconstruction, ask your plastic surgeon about doing exercises as he or she may want you to wait. We encourage you to attend the free one time ABC (After Breast Cancer) class offered by [facility].  You will learn information related to lymphedema risk, prevention and treatment and additional exercises to regain mobility following surgery.  You can call [phone] for more information.  This is offered the 1st and 3rd Monday of each month.  You only attend the class one time. While undergoing any medical procedure or treatment, try to avoid blood pressure being taken or needle sticks from occurring on the arm on the side of cancer.   This recommendation begins after surgery and continues for the rest of your life.  This may help reduce your risk of getting lymphedema (swelling in your arm). An excellent resource for those seeking information on lymphedema is the National Lymphedema Network's web site. It can be accessed at www.lymphnet.org If you notice swelling in your hand, arm or breast at any time following surgery (even if it is many years from now), please contact your doctor or physical therapist to discuss this.  Lymphedema can be treated at any time but it is easier for you if it is treated early on.  If you feel like your shoulder motion is not returning to normal in a reasonable amount of time, please contact your surgeon or physical therapist.  [facility] [phone]. [address], [place] [address]  ABC CLASS After Breast Cancer Class  After Breast Cancer Class is a specially designed  exercise class to assist you in a safe recover after having breast cancer surgery.  In this class you will learn how to get back to full function whether your drains were just removed or if you had surgery a month ago.  This one-time class is held the 1st and 3rd Monday of every month from 11:00 a.m. until 12:00 noon virtually.  This class is FREE and space is limited. For more information or to register for the next available class, call [phone].  Class Goals  Understand specific stretches to improve the flexibility of you chest and shoulder. Learn ways to safely strengthen your upper body and improve your posture. Understand the warning signs of infection and why you may be at risk for an arm infection. Learn about Lymphedema and prevention.  ** You do not attend this class until after surgery.  Drains must be removed to participate  Patient was instructed today in a home exercise program today for post op shoulder range of motion. These included active assist shoulder flexion in sitting, scapular retraction, wall walking with shoulder abduction, and hands behind head external rotation.  She was encouraged to do these twice a day, holding 3 seconds and repeating 5 times when permitted by her physician.    Waynette Buttery, PT 09/25/2022, 4:58 PM

## 2022-09-25 ENCOUNTER — Other Ambulatory Visit: Payer: Self-pay | Admitting: General Surgery

## 2022-09-25 ENCOUNTER — Other Ambulatory Visit: Payer: Self-pay

## 2022-09-25 ENCOUNTER — Ambulatory Visit: Payer: Medicare HMO | Attending: General Surgery

## 2022-09-25 DIAGNOSIS — R293 Abnormal posture: Secondary | ICD-10-CM | POA: Insufficient documentation

## 2022-09-25 DIAGNOSIS — Z170 Estrogen receptor positive status [ER+]: Secondary | ICD-10-CM | POA: Insufficient documentation

## 2022-09-25 DIAGNOSIS — C50511 Malignant neoplasm of lower-outer quadrant of right female breast: Secondary | ICD-10-CM

## 2022-09-26 ENCOUNTER — Inpatient Hospital Stay: Payer: Medicare HMO | Attending: Hematology and Oncology | Admitting: Hematology and Oncology

## 2022-09-26 ENCOUNTER — Inpatient Hospital Stay: Payer: Medicare HMO

## 2022-09-26 ENCOUNTER — Other Ambulatory Visit: Payer: Self-pay

## 2022-09-26 ENCOUNTER — Encounter: Payer: Self-pay | Admitting: Radiology

## 2022-09-26 ENCOUNTER — Other Ambulatory Visit: Payer: Self-pay | Admitting: *Deleted

## 2022-09-26 VITALS — BP 156/75 | HR 95 | Temp 97.9°F | Resp 17 | Ht 69.0 in | Wt 225.3 lb

## 2022-09-26 DIAGNOSIS — Z170 Estrogen receptor positive status [ER+]: Secondary | ICD-10-CM

## 2022-09-26 DIAGNOSIS — C50411 Malignant neoplasm of upper-outer quadrant of right female breast: Secondary | ICD-10-CM | POA: Diagnosis not present

## 2022-09-26 LAB — RESEARCH LABS

## 2022-09-26 NOTE — Progress Notes (Signed)
Cancer Center CONSULT NOTE  Patient Care Team: Agapito Games, MD as PCP - General (Family Medicine)  CHIEF COMPLAINTS/PURPOSE OF CONSULTATION:  Newly diagnosed breast cancer  HISTORY OF PRESENTING ILLNESS:  [name] [name] 67 y.o. female is here because of recent diagnosis of right breast cancer.  Patient had routine screening mammogram that detected a right breast asymmetry without any ultrasound correlate.  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  With a Ki-67 of 10%.  She was presented yesterday in the multidisciplinary tumor board and she is here today to discuss her treatment plan.  I reviewed her records extensively and collaborated the history with the patient.  SUMMARY OF ONCOLOGIC HISTORY: Oncology History  Malignant neoplasm of upper-outer quadrant of right breast in female, estrogen receptor positive (HCC)  09/18/2022 Initial Diagnosis   Screening mammogram detected right breast asymmetry without any sonographic correlate 7 mm, biopsy: Grade 2 IDC ER 95%, PR 80%, Ki-67 10%, HER2 positive (3+)      MEDICAL HISTORY:  Past Medical History:  Diagnosis Date   Allergy    some statins   Arthritis forever   Cataract both have been removed   Glaucoma    HTN (hypertension) 04/04/2010   Qualifier: Diagnosis of  By: Cathey Endow DO, Karen     Hyperlipidemia    Hypothyroidism    Migraine    hx of   Migraines    Obesity    Obesity     SURGICAL HISTORY: Past Surgical History:  Procedure Laterality Date   ABDOMINAL HYSTERECTOMY  2005   complete   BREAST BIOPSY Right 09/18/2022   MM RT BREAST BX W LOC DEV 1ST LESION IMAGE BX SPEC STEREO GUIDE 09/18/2022 [facility]   CATARACT EXTRACTION W/ INTRAOCULAR LENS IMPLANT Left 05/10/2014   dexa  08/2005   (+) 1.7   EYE SURGERY  lasik   plus 2 cataract removals   JOINT REPLACEMENT     L hip replacement  2011   migraines, resolved  2003   Right foot surgery  2000   callus removed   TOTAL ABDOMINAL HYSTERECTOMY  2005    SOCIAL HISTORY: Social History   Socioeconomic History   Marital status: Divorced    Spouse name: Not on file   Number of children: 0   Years of education: 14   Highest education level: Associate degree: academic program  Occupational History    Employer: [employer]   Occupation: Retired  Tobacco Use   Smoking status: Former    Packs/day: 1.00    Years: 15.00    Additional pack years: 0.00    Total pack years: 15.00    Types: Cigarettes    Quit date: 05/20/1994    Years since quitting: 28.3   Smokeless tobacco: Never   Tobacco comments:    quit smoking long time ago  Vaping Use   Vaping Use: Never used  Substance and Sexual Activity   Alcohol use: Yes    Alcohol/week: 6.0 standard drinks of alcohol    Types: 3 Glasses of wine, 3 Cans of beer per week    Comment: 2-3 glasses of wine on weekends   Drug use: No   Sexual activity: Not Currently    Birth control/protection: None  Other Topics Concern   Not on file  Social History Narrative   Lives alone. She enjoys playing darts, going out with friends  and walking around malls and park.   Social Determinants of Health   Financial Resource Strain: Low Risk  (08/31/2022)   Overall Financial Resource Strain (CARDIA)    Difficulty of Paying Living Expenses: Not hard at all  Food Insecurity: No Food Insecurity (08/31/2022)   Hunger Vital Sign    Worried About Running Out of Food in the Last Year: Never true    Ran Out of Food in the Last Year: Never true  Transportation Needs: No Transportation Needs (08/31/2022)   PRAPARE - Administrator, Civil Service (Medical): No    Lack of Transportation (Non-Medical): No  Physical Activity: Insufficiently Active (08/31/2022)   Exercise Vital Sign    Days of Exercise per Week: 2 days    Minutes of Exercise per Session: 20 min  Stress: No Stress  Concern Present (08/31/2022)   [employer] of Occupational Health - Occupational Stress Questionnaire    Feeling of Stress : Only a little  Social Connections: Socially Isolated (08/31/2022)   Social Connection and Isolation Panel [NHANES]    Frequency of Communication with Friends and Family: Twice a week    Frequency of Social Gatherings with Friends and Family: Twice a week    Attends Religious Services: Never    Database administrator or Organizations: No    Attends Engineer, structural: Patient declined    Marital Status: Divorced  Catering manager Violence: Not At Risk (06/28/2022)   Humiliation, Afraid, Rape, and Kick questionnaire    Fear of Current or Ex-Partner: No    Emotionally Abused: No    Physically Abused: No    Sexually Abused: No    FAMILY HISTORY: Family History  Problem Relation Age of Onset   Heart attack Mother    CAD Mother        stents   Hyperlipidemia Mother    Arthritis Mother    Heart disease Mother    Other Father 55       stents   Hypertension Father    Arthritis Father    Diabetes Father    Heart disease Father    Arthritis Sister    Obesity Sister    Hyperlipidemia Brother    Vision loss Maternal Aunt     ALLERGIES:  is allergic to lipitor [atorvastatin], dorzolamide hydrochloride [dorzolamide], lisinopril, simvastatin, statins, timolol maleate [timolol], and livalo [pitavastatin].  MEDICATIONS:  Current Outpatient Medications  Medication Sig Dispense Refill   amLODipine (NORVASC) 5 MG tablet TAKE 1 TABLET EVERY DAY 90 tablet 3   Calcium Carbonate (CALCIUM 600 PO) Take by mouth.     Cholecalciferol 125 MCG (5000 UT) capsule Take 5,000 Units by mouth daily.     Cyanocobalamin (B-12) 2500 MCG TABS Take 1 tablet by mouth daily.     fish oil-omega-3 fatty acids 1000 MG capsule Take 1 g by mouth 2 (two) times daily.      latanoprost (XALATAN) 0.005 % ophthalmic solution Place 1 drop into both eyes at bedtime.     levothyroxine  (SYNTHROID) 125 MCG tablet TAKE 1 TABLET EVERY DAY 90 tablet 3   losartan-hydrochlorothiazide (HYZAAR) 100-25 MG tablet TAKE 1 TABLET EVERY DAY 90 tablet 3   metFORMIN (GLUCOPHAGE) 500 MG tablet TAKE 1 TABLET TWICE DAILY WITH MEALS 180 tablet 3   Multiple Vitamin (MULTIVITAMIN) capsule Take 1 capsule by mouth daily.     rosuvastatin (CRESTOR) 5 MG tablet TAKE 1 TABLET AT BEDTIME 90 tablet 3   timolol (BETIMOL) 0.5 %  ophthalmic solution Place 1 drop into both eyes daily. AM     triamcinolone (KENALOG) 0.1 % Apply 1 application topically at bedtime. 15 g 0   No current facility-administered medications for this visit.    REVIEW OF SYSTEMS:   Constitutional: Denies fevers, chills or abnormal night sweats   All other systems were reviewed with the patient and are negative.  PHYSICAL EXAMINATION: ECOG PERFORMANCE STATUS: 0 - Asymptomatic  There were no vitals filed for this visit. There were no vitals filed for this visit.  GENERAL:alert, no distress and comfortable    LABORATORY DATA:  I have reviewed the data as listed Lab Results  Component Value Date   WBC 6.0 09/02/2022   HGB 14.4 09/02/2022   HCT 44.3 09/02/2022   MCV 89.3 09/02/2022   PLT 352 09/02/2022   Lab Results  Component Value Date   NA 140 09/02/2022   K 4.5 09/02/2022   CL 103 09/02/2022   CO2 27 09/02/2022    RADIOGRAPHIC STUDIES: I have personally reviewed the radiological reports and agreed with the findings in the report.  ASSESSMENT AND PLAN:  Malignant neoplasm of upper-outer quadrant of right breast in female, estrogen receptor positive (HCC) 09/18/2022:Screening mammogram detected right breast asymmetry without any sonographic correlate 7 mm, biopsy: Grade 2 IDC ER 95%, PR 80%, Ki-67 10%, HER2 positive (3+)  Pathology and radiology counseling: Discussed with the patient, the details of pathology including the type of breast cancer,the clinical staging, the significance of ER, PR and HER-2/neu  receptors and the implications for treatment. After reviewing the pathology in detail, we proceeded to discuss the different treatment options between surgery, radiation, chemotherapy, antiestrogen therapies.  Treatment plan: Breast conserving surgery with sentinel lymph node biopsy Adjuvant chemotherapy recommendations based upon the final pathology size.  If the final size is greater than 5 mm she would benefit from chemo with anti-HER2 therapy. Adjuvant radiation therapy Followed by adjuvant antiestrogen therapy  Because the Ki-67 is only 10%, my recommendation is also to repeat HER2 testing on the final pathology to confirm HER2 positivity. Patient is in the process of moving to [place].   I will see her 1 week after surgery to discuss pathology report.  All questions were answered. The patient knows to call the clinic with any problems, questions or concerns.    Tamsen Meek, MD 09/26/22

## 2022-09-26 NOTE — Assessment & Plan Note (Addendum)
09/18/2022:Screening mammogram detected right breast asymmetry without any sonographic correlate 7 mm, biopsy: Grade 2 IDC ER 95%, PR 80%, Ki-67 10%, HER2 positive (3+)  Pathology and radiology counseling: Discussed with the patient, the details of pathology including the type of breast cancer,the clinical staging, the significance of ER, PR and HER-2/neu receptors and the implications for treatment. After reviewing the pathology in detail, we proceeded to discuss the different treatment options between surgery, radiation, chemotherapy, antiestrogen therapies.  Treatment plan: Breast conserving surgery with sentinel lymph node biopsy Adjuvant chemotherapy recommendations based upon the final pathology size.  If the final size is greater than 5 mm she would benefit from chemo with anti-HER2 therapy. Adjuvant radiation therapy Followed by adjuvant antiestrogen therapy  Because the Ki-67 is only 10%, my recommendation is also to repeat HER2 testing on the final pathology to confirm HER2 positivity. Patient is in the process of moving to [place].  Therefore further adjuvant systemic therapy decisions could be made by her oncologist in [place].

## 2022-09-26 NOTE — Research (Signed)
[employer] 2021-05 - Specimen Collection Study to Evaluate Biomarkers in Subjects with Cancer    This Nurse has reviewed this patient's inclusion and exclusion criteria as a second review and confirms [name] [name] is eligible for study participation.  Patient may continue with enrollment.  Margret Chance Cheryl Stabenow, RN, BSN, [facility] She  Her  Hers Clinical Research Nurse [facility] Direct Dial [phone]  Pager [phone] 09/26/2022 1:59 PM

## 2022-09-26 NOTE — Research (Signed)
[employer] 2021-05 - Specimen Collection Study to Evaluate Biomarkers in Subjects with Cancer    09/26/22  ELIGIBILITY:  This Coordinator has reviewed this patient's inclusion and exclusion criteria and confirmed [name] [name] is eligible for study participation.  Patient will continue with enrollment.  Menopausal status (women only): [name] [name] is post-menopausal.  Eligibility confirmed by treating investigator, who also agrees that patient should proceed with enrollment.   CONSENT:  Patient [name] [name] was identified by Dr. Pamelia Hoit as a potential candidate for the above listed study.  This Clinical Research Coordinator met with [name] [name], [id number], on 09/26/22 in a manner and location that ensures patient privacy to discuss participation in the above listed research study.  Patient is Unaccompanied.  A copy of the informed consent document with embedded HIPAA language was provided to the patient.  Patient reads, speaks, and understands [place].    Patient was provided with the business card of this Coordinator and encouraged to contact the research team with any questions.  Patient was provided the option of taking informed consent documents home to review and was encouraged to review at their convenience with their support network, including other care providers. Patient is comfortable with making a decision regarding study participation today.  As outlined in the informed consent form, this Coordinator and [name] [name] discussed the purpose of the research study, the investigational nature of the study, study procedures and requirements for study participation, potential risks and benefits of study participation, as well as alternatives to participation. This study is not blinded. The patient understands participation is voluntary and they may withdraw from study participation at any time.  This study does not involve randomization.  This study does not involve an  investigational drug or device. This study does not involve a placebo. Patient understands enrollment is pending full eligibility review.   Confidentiality and how the patient's information will be used as part of study participation were discussed.  Patient was informed there is reimbursement provided for their time and effort spent on trial participation.  The patient is encouraged to discuss research study participation with their insurance provider to determine what costs they may incur as part of study participation, including research related injury.    All questions were answered to patient's satisfaction.  The informed consent with embedded HIPAA language was reviewed page by page.  The patient's mental and emotional status is appropriate to provide informed consent, and the patient verbalizes an understanding of study participation.  Patient has agreed to participate in the above listed research study and has voluntarily signed the informed consent 02 Jun 2020 version with embedded HIPAA language, version 02 Jun 2020  on 09/26/22 at 0135PM.  The patient was provided with a copy of the signed informed consent form with embedded HIPAA language for their reference.  No study specific procedures were obtained prior to the signing of the informed consent document.  Approximately 30 minutes were spent with the patient reviewing the informed consent documents.  Patient was not requested to complete a Release of Information form.   After completion of consent documentation, patient provided the medical history as follows:   Medical History:  High Blood Pressure  Yes Coronary Artery Disease No Lupus    No Rheumatoid Arthritis  No Diabetes   Yes      If yes, which type?      Type II Lynch Syndrome  No  Is the patient currently taking a magnesium supplement?   No  Does the patient  have a personal history of cancer (greater than 5 years ago)?  No  Does the patient have a family history of cancer in  1st or 2nd degree relatives? No  Does the patient have history of alcohol consumption? Yes   If yes, current or former? current Number of years? 48 yrs--per patient, since age 81 yrs Drinks per week? 2  Does the patient have history of cigarette, cigar, pipe, or chewing tobacco use?  Yes  If yes, current for former? former If yes, type (Cigarette, cigar, pipe, and/or chewing tobacco)? cigarette If former, year stopped? 2001 Number of years? 15 Packs/number/containers per day? 1 pack per day   After completion of medical history, patient was escorted to the lab area for lab collection: Blood Collection: Research blood obtained by [facility] venipuncture. Patient tolerated well without any adverse events. Gift Card: $50 gift card given to patient for her participation in this study.    Patient was provided a copy of this coordinator's business card and was encouraged to call with any questions or concerns that she may have. Patient was thanked for her time and support of the above mentioned study.   [name] [name], RT(R)(T) Clinical Research Coordinator

## 2022-09-27 ENCOUNTER — Telehealth: Payer: Self-pay | Admitting: *Deleted

## 2022-09-27 ENCOUNTER — Telehealth: Payer: Self-pay | Admitting: Hematology and Oncology

## 2022-09-27 NOTE — Telephone Encounter (Signed)
Scheduled appointment per 5/9 los. Left voicemail.

## 2022-09-27 NOTE — Telephone Encounter (Signed)
Left vm regarding navigation resources and contact information for questions or needs.  

## 2022-09-30 ENCOUNTER — Encounter (HOSPITAL_BASED_OUTPATIENT_CLINIC_OR_DEPARTMENT_OTHER): Payer: Self-pay | Admitting: General Surgery

## 2022-09-30 ENCOUNTER — Other Ambulatory Visit: Payer: Self-pay | Admitting: General Surgery

## 2022-09-30 DIAGNOSIS — C50511 Malignant neoplasm of lower-outer quadrant of right female breast: Secondary | ICD-10-CM

## 2022-10-01 ENCOUNTER — Encounter (HOSPITAL_BASED_OUTPATIENT_CLINIC_OR_DEPARTMENT_OTHER)
Admission: RE | Admit: 2022-10-01 | Discharge: 2022-10-01 | Disposition: A | Discharge status: Home / Self Care | Payer: Medicare HMO | Source: Ambulatory Visit | Attending: General Surgery | Admitting: General Surgery

## 2022-10-01 DIAGNOSIS — Z01818 Encounter for other preprocedural examination: Secondary | ICD-10-CM | POA: Insufficient documentation

## 2022-10-01 LAB — BASIC METABOLIC PANEL
Anion gap: 12 (ref 5–15)
BUN: 18 mg/dL (ref 8–23)
CO2: 22 mmol/L (ref 22–32)
Calcium: 9.4 mg/dL (ref 8.9–10.3)
Chloride: 102 mmol/L (ref 98–111)
Creatinine, Ser: 0.67 mg/dL (ref 0.44–1.00)
GFR, Estimated: >60 mL/min (ref >60)
Glucose, Bld: 140 mg/dL — ABNORMAL HIGH (ref 70–99)
Potassium: 4.2 mmol/L (ref 3.5–5.1)
Sodium: 136 mmol/L (ref 135–145)

## 2022-10-01 MED ORDER — CHLORHEXIDINE GLUCONATE CLOTH 2 % EX PADS: 6.0000 | MEDICATED_PAD | Freq: Once | CUTANEOUS | Status: DC

## 2022-10-01 MED ORDER — ENSURE PRE-SURGERY PO LIQD: 296.0000 mL | Freq: Once | ORAL | Status: DC

## 2022-10-01 NOTE — Progress Notes (Signed)

## 2022-10-04 ENCOUNTER — Ambulatory Visit
Admission: RE | Admit: 2022-10-04 | Discharge: 2022-10-04 | Disposition: A | Discharge status: Home / Self Care | Payer: Medicare HMO | Source: Ambulatory Visit | Attending: General Surgery | Admitting: General Surgery

## 2022-10-04 ENCOUNTER — Other Ambulatory Visit: Payer: Medicare HMO

## 2022-10-04 DIAGNOSIS — C50511 Malignant neoplasm of lower-outer quadrant of right female breast: Secondary | ICD-10-CM

## 2022-10-04 DIAGNOSIS — C50911 Malignant neoplasm of unspecified site of right female breast: Secondary | ICD-10-CM | POA: Diagnosis not present

## 2022-10-04 HISTORY — PX: BREAST BIOPSY: SHX20

## 2022-10-07 ENCOUNTER — Other Ambulatory Visit: Payer: Self-pay

## 2022-10-07 ENCOUNTER — Ambulatory Visit (HOSPITAL_BASED_OUTPATIENT_CLINIC_OR_DEPARTMENT_OTHER): Payer: Medicare HMO | Admitting: Anesthesiology

## 2022-10-07 ENCOUNTER — Ambulatory Visit (HOSPITAL_BASED_OUTPATIENT_CLINIC_OR_DEPARTMENT_OTHER)
Admission: RE | Admit: 2022-10-07 | Discharge: 2022-10-07 | Disposition: A | Discharge status: Home / Self Care | Payer: Medicare HMO | Attending: General Surgery | Admitting: General Surgery

## 2022-10-07 ENCOUNTER — Ambulatory Visit
Admission: RE | Admit: 2022-10-07 | Discharge: 2022-10-07 | Disposition: A | Discharge status: Home / Self Care | Payer: Medicare HMO | Source: Ambulatory Visit | Attending: General Surgery | Admitting: General Surgery

## 2022-10-07 ENCOUNTER — Encounter (HOSPITAL_BASED_OUTPATIENT_CLINIC_OR_DEPARTMENT_OTHER): Payer: Self-pay | Admitting: General Surgery

## 2022-10-07 ENCOUNTER — Encounter (HOSPITAL_BASED_OUTPATIENT_CLINIC_OR_DEPARTMENT_OTHER)
Admission: RE | Disposition: A | Discharge status: Home / Self Care | Payer: Self-pay | Source: Home / Self Care | Attending: General Surgery

## 2022-10-07 DIAGNOSIS — G8918 Other acute postprocedural pain: Secondary | ICD-10-CM | POA: Diagnosis not present

## 2022-10-07 DIAGNOSIS — Z87891 Personal history of nicotine dependence: Secondary | ICD-10-CM | POA: Diagnosis not present

## 2022-10-07 DIAGNOSIS — Z79899 Other long term (current) drug therapy: Secondary | ICD-10-CM | POA: Insufficient documentation

## 2022-10-07 DIAGNOSIS — I10 Essential (primary) hypertension: Secondary | ICD-10-CM

## 2022-10-07 DIAGNOSIS — Z170 Estrogen receptor positive status [ER+]: Secondary | ICD-10-CM | POA: Diagnosis not present

## 2022-10-07 DIAGNOSIS — Z09 Encounter for follow-up examination after completed treatment for conditions other than malignant neoplasm: Secondary | ICD-10-CM | POA: Diagnosis not present

## 2022-10-07 DIAGNOSIS — C50911 Malignant neoplasm of unspecified site of right female breast: Secondary | ICD-10-CM

## 2022-10-07 DIAGNOSIS — C50511 Malignant neoplasm of lower-outer quadrant of right female breast: Secondary | ICD-10-CM | POA: Insufficient documentation

## 2022-10-07 DIAGNOSIS — E039 Hypothyroidism, unspecified: Secondary | ICD-10-CM

## 2022-10-07 DIAGNOSIS — Z7984 Long term (current) use of oral hypoglycemic drugs: Secondary | ICD-10-CM | POA: Diagnosis not present

## 2022-10-07 DIAGNOSIS — N6489 Other specified disorders of breast: Secondary | ICD-10-CM | POA: Insufficient documentation

## 2022-10-07 DIAGNOSIS — E119 Type 2 diabetes mellitus without complications: Secondary | ICD-10-CM | POA: Diagnosis not present

## 2022-10-07 DIAGNOSIS — Z9889 Other specified postprocedural states: Secondary | ICD-10-CM | POA: Diagnosis not present

## 2022-10-07 DIAGNOSIS — E669 Obesity, unspecified: Secondary | ICD-10-CM | POA: Diagnosis not present

## 2022-10-07 DIAGNOSIS — Z6834 Body mass index (BMI) 34.0-34.9, adult: Secondary | ICD-10-CM | POA: Insufficient documentation

## 2022-10-07 DIAGNOSIS — R6889 Other general symptoms and signs: Secondary | ICD-10-CM | POA: Diagnosis not present

## 2022-10-07 HISTORY — DX: Prediabetes: R73.03

## 2022-10-07 HISTORY — PX: BREAST LUMPECTOMY WITH RADIOACTIVE SEED AND SENTINEL LYMPH NODE BIOPSY: SHX6550

## 2022-10-07 LAB — GLUCOSE, CAPILLARY
Glucose-Capillary: 130 mg/dL — ABNORMAL HIGH (ref 70–99)
Glucose-Capillary: 122 mg/dL — ABNORMAL HIGH (ref 70–99)

## 2022-10-07 SURGERY — BREAST LUMPECTOMY WITH RADIOACTIVE SEED AND SENTINEL LYMPH NODE BIOPSY
Anesthesia: General | Site: Breast | Laterality: Right

## 2022-10-07 MED ORDER — MIDAZOLAM HCL 5 MG/5ML IJ SOLN
INTRAMUSCULAR | Status: DC | PRN
  Administered 2022-10-07: 2 mg via INTRAVENOUS

## 2022-10-07 MED ORDER — BUPIVACAINE HCL (PF) 0.25 % IJ SOLN
INTRAMUSCULAR | Status: DC | PRN
  Administered 2022-10-07: 4 mL

## 2022-10-07 MED ORDER — MIDAZOLAM HCL 2 MG/2ML IJ SOLN
2.0000 mg | Freq: Once | INTRAMUSCULAR | Status: AC
  Administered 2022-10-07: 2 mg via INTRAVENOUS

## 2022-10-07 MED ORDER — FENTANYL CITRATE (PF) 100 MCG/2ML IJ SOLN
100.0000 ug | Freq: Once | INTRAMUSCULAR | Status: AC
  Administered 2022-10-07: 100 ug via INTRAVENOUS

## 2022-10-07 MED ORDER — HYDROMORPHONE HCL 1 MG/ML IJ SOLN
0.2500 mg | INTRAMUSCULAR | Status: DC | PRN
  Administered 2022-10-07 (×2): 0.25 mg via INTRAVENOUS

## 2022-10-07 MED ORDER — FENTANYL CITRATE (PF) 100 MCG/2ML IJ SOLN
INTRAMUSCULAR | Status: DC | PRN
  Administered 2022-10-07: 50 ug via INTRAVENOUS

## 2022-10-07 MED ORDER — LACTATED RINGERS IV SOLN: INTRAVENOUS | Status: DC

## 2022-10-07 MED ORDER — ROPIVACAINE HCL 5 MG/ML IJ SOLN
INTRAMUSCULAR | Status: DC | PRN
  Administered 2022-10-07: 20 mL via PERINEURAL

## 2022-10-07 MED ORDER — ACETAMINOPHEN 500 MG PO TABS: 1000.0000 mg | ORAL_TABLET | Freq: Once | ORAL | Status: AC

## 2022-10-07 MED ORDER — FENTANYL CITRATE (PF) 100 MCG/2ML IJ SOLN
INTRAMUSCULAR | Status: AC
  Filled 2022-10-07: qty 2

## 2022-10-07 MED ORDER — MIDAZOLAM HCL 2 MG/2ML IJ SOLN
INTRAMUSCULAR | Status: AC
  Filled 2022-10-07: qty 2

## 2022-10-07 MED ORDER — ACETAMINOPHEN 500 MG PO TABS
1000.0000 mg | ORAL_TABLET | ORAL | Status: AC
  Administered 2022-10-07: 1000 mg via ORAL

## 2022-10-07 MED ORDER — CEFAZOLIN SODIUM-DEXTROSE 2-4 GM/100ML-% IV SOLN
INTRAVENOUS | Status: AC
  Filled 2022-10-07: qty 100

## 2022-10-07 MED ORDER — ONDANSETRON HCL 4 MG/2ML IJ SOLN
INTRAMUSCULAR | Status: DC | PRN
  Administered 2022-10-07: 4 mg via INTRAVENOUS

## 2022-10-07 MED ORDER — ONDANSETRON HCL 4 MG/2ML IJ SOLN: 4.0000 mg | Freq: Once | INTRAMUSCULAR | Status: DC | PRN

## 2022-10-07 MED ORDER — DEXAMETHASONE SODIUM PHOSPHATE 10 MG/ML IJ SOLN
INTRAMUSCULAR | Status: DC | PRN
  Administered 2022-10-07: 10 mg via INTRAVENOUS

## 2022-10-07 MED ORDER — MAGTRACE LYMPHATIC TRACER
INTRAMUSCULAR | Status: DC | PRN
  Administered 2022-10-07: 1.5 mL via INTRAMUSCULAR

## 2022-10-07 MED ORDER — BUPIVACAINE LIPOSOME 1.3 % IJ SUSP
INTRAMUSCULAR | Status: DC | PRN
  Administered 2022-10-07: 10 mL via PERINEURAL

## 2022-10-07 MED ORDER — PROPOFOL 10 MG/ML IV BOLUS
INTRAVENOUS | Status: DC | PRN
  Administered 2022-10-07: 200 mg via INTRAVENOUS

## 2022-10-07 MED ORDER — PHENYLEPHRINE 80 MCG/ML (10ML) SYRINGE FOR IV PUSH (FOR BLOOD PRESSURE SUPPORT)
PREFILLED_SYRINGE | INTRAVENOUS | Status: AC
  Filled 2022-10-07: qty 10

## 2022-10-07 MED ORDER — LIDOCAINE HCL (CARDIAC) PF 100 MG/5ML IV SOSY
PREFILLED_SYRINGE | INTRAVENOUS | Status: DC | PRN
  Administered 2022-10-07: 40 mg via INTRATRACHEAL

## 2022-10-07 MED ORDER — CEFAZOLIN SODIUM-DEXTROSE 2-4 GM/100ML-% IV SOLN
2.0000 g | INTRAVENOUS | Status: AC
  Administered 2022-10-07: 2 g via INTRAVENOUS

## 2022-10-07 MED ORDER — PHENYLEPHRINE HCL (PRESSORS) 10 MG/ML IV SOLN
INTRAVENOUS | Status: DC | PRN
  Administered 2022-10-07 (×2): 80 ug via INTRAVENOUS

## 2022-10-07 MED ORDER — TRAMADOL HCL 50 MG PO TABS: 50.0000 mg | ORAL_TABLET | Freq: Four times a day (QID) | ORAL | 0 refills | Status: DC | PRN

## 2022-10-07 MED ORDER — ONDANSETRON HCL 4 MG/2ML IJ SOLN
INTRAMUSCULAR | Status: AC
  Filled 2022-10-07: qty 2

## 2022-10-07 MED ORDER — PROPOFOL 500 MG/50ML IV EMUL
INTRAVENOUS | Status: DC | PRN
  Administered 2022-10-07: 150 ug/kg/min via INTRAVENOUS

## 2022-10-07 MED ORDER — AMISULPRIDE (ANTIEMETIC) 5 MG/2ML IV SOLN: 10.0000 mg | Freq: Once | INTRAVENOUS | Status: DC | PRN

## 2022-10-07 MED ORDER — HYDROMORPHONE HCL 1 MG/ML IJ SOLN
INTRAMUSCULAR | Status: AC
  Filled 2022-10-07: qty 0.5

## 2022-10-07 MED ORDER — OXYCODONE HCL 5 MG/5ML PO SOLN: 5.0000 mg | Freq: Once | ORAL | Status: DC | PRN

## 2022-10-07 MED ORDER — OXYCODONE HCL 5 MG PO TABS: 5.0000 mg | ORAL_TABLET | Freq: Once | ORAL | Status: DC | PRN

## 2022-10-07 MED ORDER — PROPOFOL 10 MG/ML IV BOLUS
INTRAVENOUS | Status: AC
  Filled 2022-10-07: qty 20

## 2022-10-07 MED ORDER — ACETAMINOPHEN 500 MG PO TABS
ORAL_TABLET | ORAL | Status: AC
  Filled 2022-10-07: qty 2

## 2022-10-07 SURGICAL SUPPLY — 54 items
ADH SKN CLS APL DERMABOND .7 (GAUZE/BANDAGES/DRESSINGS) ×1
APL PRP STRL LF DISP 70% ISPRP (MISCELLANEOUS) ×1
APPLIER CLIP 9.375 MED OPEN (MISCELLANEOUS) ×1
APR CLP MED 9.3 20 MLT OPN (MISCELLANEOUS) ×1
BINDER BREAST XLRG (GAUZE/BANDAGES/DRESSINGS) IMPLANT
BINDER BREAST XXLRG (GAUZE/BANDAGES/DRESSINGS) IMPLANT
BLADE SURG 15 STRL LF DISP TIS (BLADE) ×1 IMPLANT
BLADE SURG 15 STRL SS (BLADE) ×1
CANISTER SUC SOCK COL 7IN (MISCELLANEOUS) IMPLANT
CANISTER SUCT 1200ML W/VALVE (MISCELLANEOUS) IMPLANT
CHLORAPREP W/TINT 26 (MISCELLANEOUS) ×1 IMPLANT
CLIP APPLIE 9.375 MED OPEN (MISCELLANEOUS) IMPLANT
CLIP TI WIDE RED SMALL 6 (CLIP) ×1 IMPLANT
COVER BACK TABLE 60X90IN (DRAPES) ×1 IMPLANT
COVER MAYO STAND STRL (DRAPES) ×1 IMPLANT
COVER PROBE CYLINDRICAL 5X96 (MISCELLANEOUS) ×1 IMPLANT
DERMABOND ADVANCED .7 DNX12 (GAUZE/BANDAGES/DRESSINGS) ×1 IMPLANT
DRAPE LAPAROSCOPIC ABDOMINAL (DRAPES) ×1 IMPLANT
DRAPE UTILITY XL STRL (DRAPES) ×1 IMPLANT
ELECT COATED BLADE 2.86 ST (ELECTRODE) ×1 IMPLANT
ELECT REM PT RETURN 9FT ADLT (ELECTROSURGICAL) ×1
ELECTRODE REM PT RTRN 9FT ADLT (ELECTROSURGICAL) ×1 IMPLANT
GLOVE BIO SURGEON STRL SZ7 (GLOVE) ×2 IMPLANT
GLOVE BIOGEL PI IND STRL 7.5 (GLOVE) ×1 IMPLANT
GOWN STRL REUS W/ TWL LRG LVL3 (GOWN DISPOSABLE) ×2 IMPLANT
GOWN STRL REUS W/TWL LRG LVL3 (GOWN DISPOSABLE) ×2
HEMOSTAT ARISTA ABSORB 3G PWDR (HEMOSTASIS) IMPLANT
KIT MARKER MARGIN INK (KITS) ×1 IMPLANT
NDL HYPO 25X1 1.5 SAFETY (NEEDLE) ×1 IMPLANT
NDL SAFETY ECLIP 18X1.5 (MISCELLANEOUS) IMPLANT
NEEDLE HYPO 25X1 1.5 SAFETY (NEEDLE) ×1 IMPLANT
NS IRRIG 1000ML POUR BTL (IV SOLUTION) IMPLANT
PACK BASIN DAY SURGERY FS (CUSTOM PROCEDURE TRAY) ×1 IMPLANT
PENCIL SMOKE EVACUATOR (MISCELLANEOUS) ×1 IMPLANT
RETRACTOR ONETRAX LX 90X20 (MISCELLANEOUS) IMPLANT
SLEEVE SCD COMPRESS KNEE MED (STOCKING) ×1 IMPLANT
SPIKE FLUID TRANSFER (MISCELLANEOUS) IMPLANT
SPONGE T-LAP 4X18 ~~LOC~~+RFID (SPONGE) ×1 IMPLANT
STRIP CLOSURE SKIN 1/2X4 (GAUZE/BANDAGES/DRESSINGS) ×1 IMPLANT
SUT ETHILON 2 0 FS 18 (SUTURE) IMPLANT
SUT MNCRL AB 4-0 PS2 18 (SUTURE) ×1 IMPLANT
SUT MON AB 5-0 PS2 18 (SUTURE) IMPLANT
SUT SILK 2 0 SH (SUTURE) IMPLANT
SUT VIC AB 2-0 SH 27 (SUTURE) ×2
SUT VIC AB 2-0 SH 27XBRD (SUTURE) ×1 IMPLANT
SUT VIC AB 3-0 SH 27 (SUTURE) ×2
SUT VIC AB 3-0 SH 27X BRD (SUTURE) ×1 IMPLANT
SUT VIC AB 5-0 PS2 18 (SUTURE) IMPLANT
SYR CONTROL 10ML LL (SYRINGE) ×1 IMPLANT
TOWEL GREEN STERILE FF (TOWEL DISPOSABLE) ×1 IMPLANT
TRACER MAGTRACE VIAL (MISCELLANEOUS) IMPLANT
TRAY FAXITRON CT DISP (TRAY / TRAY PROCEDURE) ×1 IMPLANT
TUBE CONNECTING 20X1/4 (TUBING) IMPLANT
YANKAUER SUCT BULB TIP NO VENT (SUCTIONS) IMPLANT

## 2022-10-07 NOTE — Anesthesia Preprocedure Evaluation (Addendum)
Anesthesia Evaluation    Reviewed: Allergy & Precautions, Patient's Chart, lab work & pertinent test results  Airway Mallampati: III  TM Distance: >3 FB Neck ROM: Full    Dental  (+) Teeth Intact, Dental Advisory Given   Pulmonary former smoker Quit smoking 1996, 15 pack year history   Pulmonary exam normal breath sounds clear to auscultation       Cardiovascular hypertension (153/81 preop, per pt normally 130s SBP), Pt. on medications Normal cardiovascular exam Rhythm:Regular Rate:Normal     Neuro/Psych  Headaches PSYCHIATRIC DISORDERS  Depression       GI/Hepatic negative GI ROS, Neg liver ROS,,,  Endo/Other  diabetes, Well Controlled, Type 2, Oral Hypoglycemic AgentsHypothyroidism  Obesity BMI 34  Renal/GU negative Renal ROS     Musculoskeletal  (+) Arthritis , Osteoarthritis,    Abdominal  (+) + obese  Peds  Hematology negative hematology ROS (+)   Anesthesia Other Findings   Reproductive/Obstetrics                             Anesthesia Physical Anesthesia Plan  ASA: 3  Anesthesia Plan: General   Post-op Pain Management: Tylenol PO (pre-op)* and Regional block*   Induction: Intravenous  PONV Risk Score and Plan: 3 and Ondansetron, Dexamethasone, Midazolam, Treatment may vary due to age or medical condition, Propofol infusion and TIVA  Airway Management Planned: LMA  Additional Equipment: None  Intra-op Plan:   Post-operative Plan: Extubation in OR  Informed Consent: I have reviewed the patients History and Physical, chart, labs and discussed the procedure including the risks, benefits and alternatives for the proposed anesthesia with the patient or authorized representative who has indicated his/her understanding and acceptance.     Dental advisory given  Plan Discussed with: CRNA  Anesthesia Plan Comments:        Anesthesia Quick Evaluation

## 2022-10-07 NOTE — Anesthesia Procedure Notes (Signed)
Anesthesia Regional Block: Pectoralis block   Pre-Anesthetic Checklist: , timeout performed,  Correct Patient, Correct Site, Correct Laterality,  Correct Procedure, Correct Position, site marked,  Risks and benefits discussed,  Surgical consent,  Pre-op evaluation,  At surgeon's request and post-op pain management  Laterality: Right  Prep: Maximum Sterile Barrier Precautions used, chloraprep       Needles:  Injection technique: Single-shot  Needle Type: Echogenic Stimulator Needle     Needle Length: 9cm  Needle Gauge: 22     Additional Needles:   Procedures:,,,, ultrasound used (permanent image in chart),,    Narrative:  Start time: 10/07/2022 10:25 AM End time: 10/07/2022 10:30 AM Injection made incrementally with aspirations every 5 mL.  Performed by: Personally  Anesthesiologist: Lannie Fields, DO  Additional Notes: Monitors applied. No increased pain on injection. No increased resistance to injection. Injection made in 5cc increments. Good needle visualization. Patient tolerated procedure well.

## 2022-10-07 NOTE — Discharge Instructions (Addendum)
[facility] Office Phone Number [phone]  POST OP INSTRUCTIONS Take 400 mg of ibuprofen every 8 hours or 650 mg tylenol every 6 hours for next 72 hours then as needed. Use ice several times daily also.  A prescription for pain medication may be given to you upon discharge.  Take your pain medication as prescribed, if needed.  If narcotic pain medicine is not needed, then you may take acetaminophen (Tylenol), naprosyn (Alleve) or ibuprofen (Advil) as needed. Take your usually prescribed medications unless otherwise directed If you need a refill on your pain medication, please contact your pharmacy.  They will contact our office to request authorization.  Prescriptions will not be filled after 5pm or on week-ends. You should eat very light the first 24 hours after surgery, such as soup, crackers, pudding, etc.  Resume your normal diet the day after surgery. Most patients will experience some swelling and bruising in the breast.  Ice packs and a good support bra will help.  Wear the breast binder provided or a sports bra for 72 hours day and night.  After that wear a sports bra during the day until you return to the office. Swelling and bruising can take several days to resolve.  It is common to experience some constipation if taking pain medication after surgery.  Increasing fluid intake and taking a stool softener will usually help or prevent this problem from occurring.  A mild laxative (Milk of Magnesia or Miralax) should be taken according to package directions if there are no bowel movements after 48 hours. I used skin glue on the incision, you may shower in 24 hours.  The glue will flake off over the next 2-3 weeks.  Any sutures or staples will be removed at the office during your follow-up visit. ACTIVITIES:  You may resume regular daily activities (gradually increasing) beginning the next day.  Wearing a good support bra or sports bra minimizes pain and swelling.  You may have  sexual intercourse when it is comfortable. You may drive when you no longer are taking prescription pain medication, you can comfortably wear a seatbelt, and you can safely maneuver your car and apply brakes. RETURN TO WORK:  ______________________________________________________________________________________ Bonita Quin should see your doctor in the office for a follow-up appointment approximately two weeks after your surgery.  Your doctor's nurse will typically make your follow-up appointment when she calls you with your pathology report.  Expect your pathology report 3-4 business days after your surgery.  You may call to check if you do not hear from [place] after three days. OTHER INSTRUCTIONS: _______________________________________________________________________________________________ _____________________________________________________________________________________________________________________________________ _____________________________________________________________________________________________________________________________________ _____________________________________________________________________________________________________________________________________  WHEN TO CALL DR WAKEFIELD: Fever over 101.0 Nausea and/or vomiting. Extreme swelling or bruising. Continued bleeding from incision. Increased pain, redness, or drainage from the incision.  The clinic staff is available to answer your questions during regular business hours.  Please don't hesitate to call and ask to speak to one of the nurses for clinical concerns.  If you have a medical emergency, go to the nearest emergency room or call 911.  A surgeon from [facility] is always on call at the hospital.  For further questions, please visit [email or web address]   Post Anesthesia Home Care Instructions  Activity: Get plenty of rest for the remainder of the day. A responsible individual must stay  with you for 24 hours following the procedure.  For the next 24 hours, DO NOT: -Drive a car -Advertising copywriter -Drink alcoholic beverages -Take any medication unless instructed by  your physician -Make any legal decisions or sign important papers.  Meals: Start with liquid foods such as gelatin or soup. Progress to regular foods as tolerated. Avoid greasy, spicy, heavy foods. If nausea and/or vomiting occur, drink only clear liquids until the nausea and/or vomiting subsides. Call your physician if vomiting continues.  Special Instructions/Symptoms: Your throat may feel dry or sore from the anesthesia or the breathing tube placed in your throat during surgery. If this causes discomfort, gargle with warm salt water. The discomfort should disappear within 24 hours.  Information for Discharge Teaching: EXPAREL (bupivacaine liposome injectable suspension)   Your surgeon or anesthesiologist gave you EXPAREL(bupivacaine) to help control your pain after surgery.  EXPAREL is a local anesthetic that provides pain relief by numbing the tissue around the surgical site. EXPAREL is designed to release pain medication over time and can control pain for up to 72 hours. Depending on how you respond to EXPAREL, you may require less pain medication during your recovery.  Possible side effects: Temporary loss of sensation or ability to move in the area where bupivacaine was injected. Nausea, vomiting, constipation Rarely, numbness and tingling in your mouth or lips, lightheadedness, or anxiety may occur. Call your doctor right away if you think you may be experiencing any of these sensations, or if you have other questions regarding possible side effects.  Follow all other discharge instructions given to you by your surgeon or nurse. Eat a healthy diet and drink plenty of water or other fluids.  If you return to the hospital for any reason within 96 hours following the administration of EXPAREL, it is  important for health care providers to know that you have received this anesthetic. A teal colored band has been placed on your arm with the date, time and amount of EXPAREL you have received in order to alert and inform your health care providers. Please leave this armband in place for the full 96 hours following administration, and then you may remove the band.Information for Discharge Teaching: EXPAREL (bupivacaine liposome injectable suspension)   Your surgeon or anesthesiologist gave you EXPAREL(bupivacaine) to help control your pain after surgery.  EXPAREL is a local anesthetic that provides pain relief by numbing the tissue around the surgical site. EXPAREL is designed to release pain medication over time and can control pain for up to 72 hours. Depending on how you respond to EXPAREL, you may require less pain medication during your recovery.  Possible side effects: Temporary loss of sensation or ability to move in the area where bupivacaine was injected. Nausea, vomiting, constipation Rarely, numbness and tingling in your mouth or lips, lightheadedness, or anxiety may occur. Call your doctor right away if you think you may be experiencing any of these sensations, or if you have other questions regarding possible side effects.  Follow all other discharge instructions given to you by your surgeon or nurse. Eat a healthy diet and drink plenty of water or other fluids.  If you return to the hospital for any reason within 96 hours following the administration of EXPAREL, it is important for health care providers to know that you have received this anesthetic. A teal colored band has been placed on your arm with the date, time and amount of EXPAREL you have received in order to alert and inform your health care providers. Please leave this armband in place for the full 96 hours following administration, and then you may remove the band.    Next dose of Tylenol can be  taken at 4pm today if  needed.

## 2022-10-07 NOTE — Anesthesia Procedure Notes (Signed)
Procedure Name: LMA Insertion Date/Time: 10/07/2022 10:49 AM  Performed by: Thornell Mule, CRNAPre-anesthesia Checklist: Patient identified, Emergency Drugs available, Suction available and Patient being monitored Patient Re-evaluated:Patient Re-evaluated prior to induction Oxygen Delivery Method: Circle system utilized Preoxygenation: Pre-oxygenation with 100% oxygen Induction Type: IV induction LMA: LMA inserted LMA Size: 4.0 Number of attempts: 1 Placement Confirmation: positive ETCO2 Tube secured with: Tape Dental Injury: Teeth and Oropharynx as per pre-operative assessment

## 2022-10-07 NOTE — Interval H&P Note (Signed)
History and Physical Interval Note:  10/07/2022 10:24 AM  [name] [name]  has presented today for surgery, with the diagnosis of RIGHT BREAST CANCER.  The various methods of treatment have been discussed with the patient and family. After consideration of risks, benefits and other options for treatment, the patient has consented to  Procedure(s): RIGHT BREAST LUMPECTOMY WITH RADIOACTIVE SEED AND AXILLARY SENTINEL LYMPH NODE BIOPSY (Right) as a surgical intervention.  The patient's history has been reviewed, patient examined, no change in status, stable for surgery.  I have reviewed the patient's chart and labs.  Questions were answered to the patient's satisfaction.     Emelia Loron

## 2022-10-07 NOTE — Anesthesia Postprocedure Evaluation (Signed)
Anesthesia Post Note  Patient: [name] [name]  Procedure(s) Performed: RIGHT BREAST LUMPECTOMY WITH RADIOACTIVE SEED AND AXILLARY SENTINEL LYMPH NODE BIOPSY (Right: Breast)     Patient location during evaluation: PACU Anesthesia Type: General Level of consciousness: awake and alert, oriented and patient cooperative Pain management: pain level controlled Vital Signs Assessment: post-procedure vital signs reviewed and stable Respiratory status: spontaneous breathing, nonlabored ventilation and respiratory function stable Cardiovascular status: blood pressure returned to baseline and stable Postop Assessment: no apparent nausea or vomiting Anesthetic complications: no   No notable events documented.  Last Vitals:  Vitals:   10/07/22 1230 10/07/22 1235  BP: 139/72   Pulse: 65 63  Resp: 12 15  Temp:    SpO2: 98% 97%    Last Pain:  Vitals:   10/07/22 1230  TempSrc:   PainSc: 4                  Lannie Fields

## 2022-10-07 NOTE — H&P (Signed)
67 year old female who has a history of prediabetes, hypertension. She has no prior breast history. She has no family history at all. She underwent screening mammography that showed B density breast tissue. There is a lower outer quadrant asymmetry that measures about 7 mm of calcifications. There is no ultrasound correlate. 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 She is here today to discuss her options.  A complicating factor is that she was due to moved back to [facility] at the end of this month to take care of her elderly parents. She is retired  Review of Systems: A complete review of systems was obtained from the patient. I have reviewed this information and discussed as appropriate with the patient. See HPI as well for other ROS.  Review of Systems  All other systems reviewed and are negative.  Medical History: Past Medical History:  Diagnosis Date  Arthritis  Cataract cortical, senile  Glaucoma (increased eye pressure)  Hypertension  Thyroid disease   Patient Active Problem List  Diagnosis  Age-related nuclear cataract of both eyes  H/O laser assisted in situ keratomileusis  Primary open angle glaucoma of both eyes, indeterminate stage  Status post cataract extraction and insertion of intraocular lens of left eye   Past Surgical History:  Procedure Laterality Date  CORNEAL EYE SURGERY Bilateral 05/20/2005  [place]  LENS EYE SURGERY Left 06/23/2014  [facility] left eye, Dr. Cleora Fleet  HYSTERECTOMY  HYSTERECTOMY TOTAL ABDOMINAL W/REMOVAL TUBES &/OR OVARIES  INSERT / REPLACE / REMOVE PACEMAKER  JOINT REPLACEMENT  ORBITAL EYE SURGERY  PHOTOREFRACTIVE KERATOTOMY/LASIK Bilateral  REPLACEMENT TOTAL HIP W/ RESURFACING IMPLANTS  STRABISMUS EYE SURGERY  VITREOUS RETINAL SURGERY   Allergies  Allergen Reactions  Statins-Hmg-Coa Reductase  Inhibitors Other (See Comments)  MUSCLE ACHES AND CRAMPS   Current Outpatient Medications on File Prior to Visit  Medication Sig Dispense Refill  amLODIPine (NORVASC) 5 MG tablet Take 1 tablet by mouth once daily  calcium carbonate 600 mg (1,500 mg) Tab tablet Take by mouth.  hydrochlorothiazide (HYDRODIURIL) 25 MG tablet Take by mouth.  latanoprost (XALATAN) 0.005 % ophthalmic solution Apply 1 drop to eye at bedtime  levothyroxine (SYNTHROID, LEVOTHROID) 125 MCG tablet Take by mouth.  losartan-hydroCHLOROthiazide (HYZAAR) 100-25 mg tablet Take 1 tablet by mouth once daily  metFORMIN (GLUCOPHAGE) 500 MG tablet Take 1 tablet by mouth 2 (two) times daily with meals  multivitamin capsule Take by mouth.  OMEGA-3/DHA/EPA/FISH OIL (FISH OIL-OMEGA-3 FATTY ACIDS) 300-1,000 mg capsule Take by mouth.  rosuvastatin (CRESTOR) 5 MG tablet Take 1 tablet by mouth at bedtime  timolol hemihydrate (BETIMOL) 0.5 % ophthalmic solution Apply to eye  timolol maleate (TIMOPTIC) 0.5 % ophthalmic solution  ascorbic acid (VITAMIN C) 1000 MG tablet Take 1,000 mg by mouth once daily.  atorvastatin (LIPITOR) 40 MG tablet TAKE 1 TABLET DAILY  brimonidine (ALPHAGAN) 0.2 % ophthalmic solution Place 1 drop into the left eye 3 (three) times daily. 5 mL 1  cholecalciferol (VITAMIN D3) 5,000 unit capsule Take by mouth.  cyanocobalamin, vitamin B-12, 2,500 mcg Tab Take by mouth.  ofloxacin (OCUFLOX) 0.3 % ophthalmic solution 1 drop in the left eye 4 times a day. Begin 1 day prior to surgery and continue for 1 week after surgery 5 mL 0  phentermine (ADIPEX-P) 37.5 MG capsule Take by mouth.  ZINC ACETATE ORAL Take by mouth.    Family History  Problem Relation Age of Onset  Atrial fibrillation (Abnormal heart rhythm sometimes requiring treatment with blood thinners) Mother  Cataracts Mother  Coronary Artery Disease (Blocked arteries around heart) Mother  High blood pressure (Hypertension) Father  Thyroid disease Father   Diabetes type I Maternal Aunt  Macular degeneration Maternal Aunt  Sleep apnea Maternal Aunt  Cataracts Maternal Grandmother  Macular degeneration Maternal Grandmother  Cataracts Paternal Grandmother    Social History   Tobacco Use  Smoking Status Former  Smokeless Tobacco Not on file  Marital status: Divorced  Tobacco Use  Smoking status: Former  Substance and Sexual Activity  Drug use: Not Currently    Objective:   Vitals:  09/23/22 1039  BP: 138/82  Pulse: 103  Weight: (!) 103.1 kg (227 lb 3.2 oz)  Height: 172.7 cm (5\' 8" )   Body mass index is 34.55 kg/m.  Physical Exam Vitals reviewed.  Constitutional:  Appearance: Normal appearance.  Chest:  Breasts: Right: No inverted nipple, mass or nipple discharge.  Left: No inverted nipple, mass or nipple discharge.  Lymphadenopathy:  Upper Body:  Right upper body: No supraclavicular or axillary adenopathy.  Left upper body: No supraclavicular or axillary adenopathy.  Neurological:  Mental Status: She is alert.   Assessment and Plan:   Malignant neoplasm of lower-outer quadrant of right breast of female, estrogen receptor positive (CMS/HHS-HCC)  Right breast seed guided lumpectomy, right ax sn biopsy  We discussed the staging and pathophysiology of breast cancer. We discussed all of the different options for treatment for breast cancer including surgery, chemotherapy, radiation therapy, Herceptin, and antiestrogen therapy.  I do think she will likely get chemo/her 2 therapy for this tumor. I think doing surgery first and awaiting final pathology reasonable especially if she gets care at two locations. I don't think primary systemic therapy indicated. Also dont think she needs MR given data and the B density breast tissue.  We discussed a sentinel lymph node biopsy as she does not appear to having lymph node involvement right now. We discussed the performance of that with injection of Magtrace and small risk of skin  discoloration. We discussed that there is a chance of having a positive node with a sentinel lymph node biopsy and we will await the permanent pathology to make any other first further decisions in terms of her treatment. We discussed up to a 5% risk lifetime of chronic shoulder pain as well as lymphedema associated with a sentinel lymph node biopsy.  We discussed the options for treatment of the breast cancer which included lumpectomy versus a mastectomy. We discussed the performance of the lumpectomy with radioactive seed placement. We discussed a 5-10% chance of a positive margin requiring reexcision in the operating room. We also discussed that she will likely need radiation therapy if she undergoes lumpectomy. We discussed mastectomy and the postoperative care for that as well. Mastectomy can be followed by reconstruction. The decision for lumpectomy vs mastectomy has no impact on decision for chemotherapy. Most mastectomy patients will not need radiation therapy. We discussed that there is no difference in her survival whether she undergoes lumpectomy with radiation therapy or antiestrogen therapy versus a mastectomy. There is also no real difference between her recurrence in the breast.  We discussed the risks of operation including bleeding, infection, possible reoperation. She understands her further therapy will be based on what her stages at the time of her operation.

## 2022-10-07 NOTE — Transfer of Care (Signed)
Immediate Anesthesia Transfer of Care Note  Patient: [name] [name]  Procedure(s) Performed: RIGHT BREAST LUMPECTOMY WITH RADIOACTIVE SEED AND AXILLARY SENTINEL LYMPH NODE BIOPSY (Right: Breast)  Patient Location: PACU  Anesthesia Type:General  Level of Consciousness: drowsy and patient cooperative  Airway & Oxygen Therapy: Patient Spontanous Breathing and Patient connected to face mask oxygen  Post-op Assessment: Report given to RN and Post -op Vital signs reviewed and stable  Post vital signs: Reviewed and stable  Last Vitals:  Vitals Value Taken Time  BP    Temp    Pulse 72 10/07/22 1201  Resp 22 10/07/22 1201  SpO2 98 % 10/07/22 1201  Vitals shown include unvalidated device data.  Last Pain:  Vitals:   10/07/22 0955  TempSrc: Temporal  PainSc: 0-No pain         Complications: No notable events documented.

## 2022-10-07 NOTE — Progress Notes (Signed)
Assisted Dr. Finucane with right, pectoralis, ultrasound guided block. Side rails up, monitors on throughout procedure. See vital signs in flow sheet. Tolerated Procedure well. 

## 2022-10-07 NOTE — Op Note (Signed)
Preoperative diagnosis: Clinical stage I right breast cancer Postoperative diagnosis: Same as above Procedure: 1.  Right breast seed lumpectomy 2.  Right deep axillary sentinel lymph node biopsy 3.  Injection of mag trace for sentinel lymph node identification Surgical Dr. Harden Mo Anesthesia: General with a pectoral block Estimated blood loss: Minimal Complications: None Drains: None Specimens: 1.  Right breast tissue containing seed and clip marked with paint 2.  Additional superior, medial and anterior margins marked short superior, long lateral, double deep 3.  Right deep axillary sentinel lymph nodes with highest count of 1159 Sponge needle count was correct completion Disposition recovery stable condition   Indications:67 year old female who has a history of prediabetes, hypertension. She has no prior breast history. She has no family history at all. She underwent screening mammography that showed B density breast tissue. There is a lower outer quadrant asymmetry that measures about 7 mm of calcifications. There is no ultrasound correlate. 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 We discussed proceeding with lumpectomy/sn biopsy. She has been seen by oncology and will await final pathology prior to next step.    Procedure: After informed consent was obtained she was taken to the operating room.  She had undergone a pectoral block.  She was given antibiotics.  SCDs were in place.  She was then placed under general anesthesia without complication.  She was prepped and draped in the standard sterile surgical fashion.  A surgical timeout was then performed.   I injected 1.5 cc of mag trace in the subareolar position and massaged this for 5 minutes.   I then did the lumpectomy first.  I made a periareolar incision in order to hide the scar later.  I then  dissected to the seed.  I removed the seed and the surrounding tissue in order to get a clear margin.  I then obtained hemostasis.  I did a 3D image it look like I was close to a couple margins so I remove these.  I then placed clips in the cavity.  I closed the breast tissue with 2-0 Vicryl.  The skin was closed with 3-0 Vicryl and 5-0 Monocryl.  Glue and Steri-Strips were eventually applied.   I then made an incision in the low axilla.  I dissected through the axillary fascia.  I noticed several small sentinel lymph nodes that had activity.  The highest was  1159.  The background was 0.  There were no other palpable or brown nodes visible.  I then obtained hemostasis.  I closed this with 2-0 Vicryl, 3-0 Vicryl, and 4 Monocryl.  Glue and Steri-Strips were applied.   She tolerated this well was extubated and transferred recovery stable.

## 2022-10-08 ENCOUNTER — Encounter (HOSPITAL_BASED_OUTPATIENT_CLINIC_OR_DEPARTMENT_OTHER): Payer: Self-pay | Admitting: General Surgery

## 2022-10-09 DIAGNOSIS — C50511 Malignant neoplasm of lower-outer quadrant of right female breast: Secondary | ICD-10-CM | POA: Diagnosis not present

## 2022-10-09 LAB — SURGICAL PATHOLOGY

## 2022-10-10 NOTE — Progress Notes (Signed)
Patient Care Team: Agapito Games, MD as PCP - General (Family Medicine) Pershing Proud, RN as Oncology Nurse Navigator Donnelly Angelica, RN as Oncology Nurse Navigator Serena Croissant, MD as Consulting Physician (Hematology and Oncology)  DIAGNOSIS: No diagnosis found.  SUMMARY OF ONCOLOGIC HISTORY: Oncology History  Malignant neoplasm of upper-outer quadrant of right breast in female, estrogen receptor positive (HCC)  09/18/2022 Initial Diagnosis   Screening mammogram detected right breast asymmetry without any sonographic correlate 7 mm, biopsy: Grade 2 IDC ER 95%, PR 80%, Ki-67 10%, HER2 positive (3+)     CHIEF COMPLIANT: Follow-up after surgery  INTERVAL HISTORY: [name] [name] is a 67 y.o. female is here because of recent diagnosis of right breast cancer. She presents to the clinic for a follow-up.    ALLERGIES:  is allergic to lipitor [atorvastatin], dorzolamide hydrochloride [dorzolamide], lisinopril, simvastatin, statins, timolol maleate [timolol], and livalo [pitavastatin].  MEDICATIONS:  Current Outpatient Medications  Medication Sig Dispense Refill   amLODipine (NORVASC) 5 MG tablet TAKE 1 TABLET EVERY DAY 90 tablet 3   Calcium Carbonate (CALCIUM 600 PO) Take by mouth.     Cholecalciferol 125 MCG (5000 UT) capsule Take 5,000 Units by mouth daily.     fish oil-omega-3 fatty acids 1000 MG capsule Take 1 g by mouth 2 (two) times daily.      latanoprost (XALATAN) 0.005 % ophthalmic solution Place 1 drop into both eyes at bedtime.     levothyroxine (SYNTHROID) 125 MCG tablet TAKE 1 TABLET EVERY DAY 90 tablet 3   losartan-hydrochlorothiazide (HYZAAR) 100-25 MG tablet TAKE 1 TABLET EVERY DAY 90 tablet 3   metFORMIN (GLUCOPHAGE) 500 MG tablet TAKE 1 TABLET TWICE DAILY WITH MEALS 180 tablet 3   Multiple Vitamin (MULTIVITAMIN) capsule Take 1 capsule by mouth daily.     rosuvastatin (CRESTOR) 5 MG tablet TAKE 1 TABLET AT BEDTIME 90 tablet 3   timolol (BETIMOL) 0.5 %  ophthalmic solution Place 1 drop into both eyes daily. AM     traMADol (ULTRAM) 50 MG tablet Take 1 tablet (50 mg total) by mouth every 6 (six) hours as needed. 10 tablet 0   triamcinolone (KENALOG) 0.1 % Apply 1 application topically at bedtime. 15 g 0   No current facility-administered medications for this visit.    PHYSICAL EXAMINATION: ECOG PERFORMANCE STATUS: {CHL ONC ECOG PS:[phone]}  There were no vitals filed for this visit. There were no vitals filed for this visit.  BREAST:*** No palpable masses or nodules in either right or left breasts. No palpable axillary supraclavicular or infraclavicular adenopathy no breast tenderness or nipple discharge. (exam performed in the presence of a chaperone)  LABORATORY DATA:  I have reviewed the data as listed    Latest Ref Rng & Units 10/01/2022    1:00 PM 09/02/2022    9:03 AM 02/19/2022    9:56 AM  CMP  Glucose 70 - 99 mg/dL 960  454  098   BUN 8 - 23 mg/dL 18  18  17    Creatinine 0.44 - 1.00 mg/dL 1.19  1.47  8.29   Sodium 135 - 145 mmol/L 136  140  138   Potassium 3.5 - 5.1 mmol/L 4.2  4.5  4.4   Chloride 98 - 111 mmol/L 102  103  103   CO2 22 - 32 mmol/L 22  27  25    Calcium 8.9 - 10.3 mg/dL 9.4  56.2  13.0   Total Protein 6.1 - 8.1 g/dL  7.1  Total Bilirubin 0.2 - 1.2 mg/dL  0.5    AST 10 - 35 U/L  17    ALT 6 - 29 U/L  21      Lab Results  Component Value Date   WBC 6.0 09/02/2022   HGB 14.4 09/02/2022   HCT 44.3 09/02/2022   MCV 89.3 09/02/2022   PLT 352 09/02/2022   NEUTROABS 3.7 12/26/2014    ASSESSMENT & PLAN:  No problem-specific Assessment & Plan notes found for this encounter.    No orders of the defined types were placed in this encounter.  The patient has a good understanding of the overall plan. she agrees with it. she will call with any problems that may develop before the next visit here. Total time spent: 30 mins including face to face time and time spent for planning, charting and  co-ordination of care   Sherlyn Lick, CMA 10/10/22    I Janan Ridge am acting as a Neurosurgeon for [employer]  ***

## 2022-10-15 ENCOUNTER — Inpatient Hospital Stay (HOSPITAL_BASED_OUTPATIENT_CLINIC_OR_DEPARTMENT_OTHER): Payer: Medicare HMO | Admitting: Hematology and Oncology

## 2022-10-15 ENCOUNTER — Encounter: Payer: Self-pay | Admitting: *Deleted

## 2022-10-15 VITALS — BP 130/68 | HR 83 | Temp 96.8°F | Resp 18 | Wt 227.6 lb

## 2022-10-15 DIAGNOSIS — Z170 Estrogen receptor positive status [ER+]: Secondary | ICD-10-CM

## 2022-10-15 DIAGNOSIS — C50411 Malignant neoplasm of upper-outer quadrant of right female breast: Secondary | ICD-10-CM | POA: Diagnosis not present

## 2022-10-15 NOTE — Assessment & Plan Note (Addendum)
Right lumpectomy: 1 cm grade 2 IDC with DCIS, margins negative, ER 95%, PR 80%, HER2 positive 3+, Ki-67 10%, 0/3 lymph nodes negative  Pathology counseling: I discussed the final pathology report of the patient provided  a copy of this report. I discussed the margins as well as lymph node surgeries. We also discussed the final staging along with previously performed ER/PR and HER-2/neu testing.  Treatment plan: Adjuvant chemotherapy with Taxol Herceptin followed by Herceptin maintenance Adjuvant radiation Adjuvant antiestrogen therapy  With plan to repeat HER2 testing on the final path. She plans to move to [place].  If she needs chemotherapy she will move to [place] and received treatment there.  If she does not have to receive chemo then she will do radiation here.   Return to clinic based on her final decision.

## 2022-10-23 ENCOUNTER — Inpatient Hospital Stay: Payer: Medicare HMO | Attending: Hematology and Oncology | Admitting: Hematology and Oncology

## 2022-10-23 ENCOUNTER — Encounter: Payer: Self-pay | Admitting: *Deleted

## 2022-10-23 ENCOUNTER — Telehealth: Payer: Self-pay | Admitting: *Deleted

## 2022-10-23 DIAGNOSIS — Z170 Estrogen receptor positive status [ER+]: Secondary | ICD-10-CM

## 2022-10-23 DIAGNOSIS — C50411 Malignant neoplasm of upper-outer quadrant of right female breast: Secondary | ICD-10-CM | POA: Diagnosis not present

## 2022-10-23 NOTE — Progress Notes (Signed)
HEMATOLOGY-ONCOLOGY TELEPHONE VISIT PROGRESS NOTE  I connected with our patient on 10/23/22 at  3:00 PM EDT by telephone and verified that I am speaking with the correct person using two identifiers.  I discussed the limitations, risks, security and privacy concerns of performing an evaluation and management service by telephone and the availability of in person appointments.  I also discussed with the patient that there may be a patient responsible charge related to this service. The patient expressed understanding and agreed to proceed.   History of Present Illness: Telephone visit to discuss results of HER2 testing performed on the final pathology.  Oncology History  Malignant neoplasm of upper-outer quadrant of right breast in female, estrogen receptor positive (HCC)  09/18/2022 Initial Diagnosis   Screening mammogram detected right breast asymmetry without any sonographic correlate 7 mm, biopsy: Grade 2 IDC ER 95%, PR 80%, Ki-67 10%, HER2 positive (3+)   10/07/2022 Surgery   Right lumpectomy: 1 cm grade 2 IDC with DCIS, margins negative, ER 95%, PR 80%, HER2 positive 3+, Ki-67 10%, 0/3 lymph nodes negative Repeat testing of prognostic panel: ER 100%, PR 100%, Ki67 5%, HER2 equivocal 2+, FISH negative ratio 1.51)     REVIEW OF SYSTEMS:   Constitutional: Denies fevers, chills or abnormal weight loss All other systems were reviewed with the patient and are negative.  Observations/Objective:    Assessment Plan:  Malignant neoplasm of upper-outer quadrant of right breast in female, estrogen receptor positive (HCC) 10/07/2022:Right lumpectomy: 1 cm grade 2 IDC with DCIS, margins negative, ER 95%, PR 80%, HER2 positive 3+, Ki-67 10%, 0/3 lymph nodes negative Repeat testing of prognostic panel: ER 100%, PR 100%, Ki67 5%, HER2 equivocal 2+, FISH negative ratio 1.51)  Counseling: I discussed with the patient that the repeat testing on the prognostic panel came back as HER2 negative.  Therefore  she does not need chemotherapy.  I discussed with the patient that majority of patients with her type of breast cancer would not need chemo but an Oncotype DX might be helpful in making that determination.  I recommend that we obtain Oncotype DX testing.  Treatment plan: Oncotype Dx to determine if she would benefit from chemotherapy Adjuvant radiation Adjuvant antiestrogen therapy  Patient might receive these treatments in [place].   I discussed the assessment and treatment plan with the patient. The patient was provided an opportunity to ask questions and all were answered. The patient agreed with the plan and demonstrated an understanding of the instructions. The patient was advised to call back or seek an in-person evaluation if the symptoms worsen or if the condition fails to improve as anticipated.   I provided 12 minutes of non-face-to-face time during this encounter.  This includes time for charting and coordination of care   Tamsen Meek, MD

## 2022-10-23 NOTE — Telephone Encounter (Signed)
Ordered oncoytpe per Dr. Gudena. Sent requisition to pathology and exact sciences 

## 2022-10-23 NOTE — Assessment & Plan Note (Signed)
10/07/2022:Right lumpectomy: 1 cm grade 2 IDC with DCIS, margins negative, ER 95%, PR 80%, HER2 positive 3+, Ki-67 10%, 0/3 lymph nodes negative Repeat testing of prognostic panel: ER 100%, PR 100%, Ki67 5%, HER2 equivocal 2+, FISH negative ratio 1.51)  Counseling: I discussed with the patient that the repeat testing on the prognostic panel came back as HER2 negative.  Therefore she does not need chemotherapy.  I discussed with the patient that majority of patients with her type of breast cancer would not need chemo but an Oncotype DX might be helpful in making that determination.  I recommend that we obtain Oncotype DX testing.  Treatment plan: Adjuvant radiation Adjuvant antiestrogen therapy  Patient will receive these treatments in [place].

## 2022-10-27 NOTE — Therapy (Signed)
OUTPATIENT PHYSICAL THERAPY BREAST CANCER POST OP FOLLOW UP   Patient Name: [name] [name] MRN: [id number] DOB:03-04-1956, 67 y.o., female Today's Date: 10/27/2022  END OF SESSION:   Past Medical History:  Diagnosis Date   Allergy    some statins   Arthritis forever   Cataract both have been removed   Glaucoma    HTN (hypertension) 04/04/2010   Qualifier: Diagnosis of  By: Cathey Endow DO, Karen     Hyperlipidemia    Hypothyroidism    Migraine    hx of   Migraines    Obesity    Obesity    Pre-diabetes    Past Surgical History:  Procedure Laterality Date   ABDOMINAL HYSTERECTOMY  2005   complete   BREAST BIOPSY Right 09/18/2022   MM RT BREAST BX W LOC DEV 1ST LESION IMAGE BX SPEC STEREO GUIDE 09/18/2022 [facility]   BREAST BIOPSY  10/04/2022   MM RT RADIOACTIVE SEED LOC MAMMO GUIDE 10/04/2022 [facility]   BREAST LUMPECTOMY WITH RADIOACTIVE SEED AND SENTINEL LYMPH NODE BIOPSY Right 10/07/2022   Procedure: RIGHT BREAST LUMPECTOMY WITH RADIOACTIVE SEED AND AXILLARY SENTINEL LYMPH NODE BIOPSY;  Surgeon: Emelia Loron, MD;  Location: [facility];  Service: General;  Laterality: Right;   CATARACT EXTRACTION W/ INTRAOCULAR LENS IMPLANT Left 05/10/2014   dexa  08/2005   (+) 1.7   EYE SURGERY  lasik   plus 2 cataract removals   JOINT REPLACEMENT     L hip replacement 2011   migraines, resolved  2003   Right foot surgery  2000   callus removed   TOTAL ABDOMINAL HYSTERECTOMY  2005   Patient Active Problem List   Diagnosis Date Noted   Malignant neoplasm of upper-outer quadrant of right breast in female, estrogen receptor positive (HCC) 09/26/2022   Allergic conjunctivitis of both eyes 10/19/2021   Primary osteoarthritis of right knee 09/25/2021   BMI 33.0-33.9,adult 05/27/2019   Chronic pain of right knee 10/20/2015   Myalgia and myositis 10/11/2015   Status post cataract extraction and insertion of intraocular lens of left eye 06/24/2014   Primary  open angle glaucoma of both eyes, indeterminate stage 05/10/2014   H/O laser assisted in situ keratomileusis 05/10/2014   Glaucoma 10/16/2012   IFG (impaired fasting glucose) 10/16/2012   ADJUSTMENT DISORDER WITH DEPRESSED MOOD 05/23/2010   HTN (hypertension) 04/04/2010   Hypothyroidism 07/02/2007   Hyperlipidemia 07/02/2007   OBESITY 07/02/2007    PCP: Nani Gasser, MD  REFERRING PROVIDER: Emelia Loron, MD  REFERRING DIAG: Right Breast Cancer  THERAPY DIAG:  No diagnosis found.  Rationale for Evaluation and Treatment: Rehabilitation  ONSET DATE: 09/20/2022  SUBJECTIVE:  SUBJECTIVE STATEMENT: ***  PERTINENT HISTORY:  Patient was diagnosed on 09/20/2022 with right grade 2 Invasive Mammary Carcinoma. It measures .7 cm and is located in the lower outer quadrant. It is ER+, PR+, HER 2 + with a Ki67 of 10%. She is s/p Right Lumpectomy on 10/07/2022 with 0/3 LN's. She had a large seroma drained of 180 cc on 10/21/2022.She is planning to move to [place] to care for her aging parents but is not sure when she will be doing that. We may need to find someplace for her to have follow up SOZO screens.   PATIENT GOALS:  Reassess how my recovery is going related to arm function, pain, and swelling.  PAIN:  Are you having pain? {OPRCPAIN:27236}  PRECAUTIONS: Recent Surgery, right UE Lymphedema risk,   ACTIVITY LEVEL / LEISURE: ***   OBJECTIVE:   PATIENT SURVEYS:  QUICK DASH: ***  OBSERVATIONS: ***  POSTURE:  Forward head and rounded shoulders posture   LYMPHEDEMA ASSESSMENT:   UPPER EXTREMITY AROM/PROM:   A/PROM RIGHT   eval    Shoulder extension 52  Shoulder flexion 155  Shoulder abduction 178  Shoulder internal rotation 56  Shoulder external rotation 94                           (Blank rows = not tested)   A/PROM LEFT   eval  Shoulder extension 54  Shoulder flexion 152  Shoulder abduction 176  Shoulder internal rotation 64  Shoulder external rotation 94                          (Blank rows = not tested)   CERVICAL AROM: All within functional limits:        UPPER EXTREMITY STRENGTH: WNL   LYMPHEDEMA ASSESSMENTS:    LANDMARK RIGHT   eval  10 cm proximal to olecranon process 29.9  Olecranon process 26.8  10 cm proximal to ulnar styloid process 22.9  Just proximal to ulnar styloid process 16.9  Across hand at thumb web space 20.0  At base of 2nd digit 6.6  (Blank rows = not tested)   LANDMARK LEFT   eval  10 cm proximal to olecranon process 32.2  Olecranon process 26.6  10 cm proximal to ulnar styloid process 22.5  Just proximal to ulnar styloid process 17.0  Across hand at thumb web space 19.8  At base of 2nd digit 6.6  (Blank rows = not tested)    Surgery type/Date: 10/07/2022 right Lumpectomy with SLNB  Number of lymph nodes removed: 0/3 Current/past treatment (chemo, radiation, hormone therapy): Oncotype to determine need for chemo, Radiation and anti estrogens Other symptoms:  Heaviness/tightness {yes/no:20286} Pain {yes/no:20286} Pitting edema {yes/no:20286} Infections {yes/no:20286} Decreased scar mobility {yes/no:20286} Stemmer sign {yes/no:20286}  PATIENT EDUCATION:  Education details: *** Person educated: {Person educated:25204} Education method: {Education Method:25205} Education comprehension: {Education Comprehension:25206}  HOME EXERCISE PROGRAM: Reviewed previously given post op HEP. ***  ASSESSMENT:  CLINICAL IMPRESSION: She is s/p Right Lumpectomy with SLNB on 10/07/2022 with 0/3 LN's. She had a large seroma drained of 180 cc on 10/21/2022.  Pt will benefit from skilled therapeutic intervention to improve on the following deficits: Decreased knowledge of precautions, impaired UE functional use, pain, decreased ROM,  postural dysfunction.   PT treatment/interventions: ADL/Self care home management, {rehab planned interventions:25118::"Therapeutic exercises","Therapeutic activity","Neuromuscular re-education","Balance training","Gait training","Patient/Family education","Self Care","Joint mobilization"}   GOALS: Goals reviewed with patient? Yes  LONG TERM  GOALS:  (STG=LTG)  GOALS Name Target Date  Goal status  1 Pt will demonstrate she has regained full shoulder ROM and function post operatively compared to baselines.  Baseline: 11/06/2022 INITIAL  2  *** {GOALSTATUS:25110}  3  *** {GOALSTATUS:25110}  4  *** {GOALSTATUS:25110}     PLAN:  PT FREQUENCY/DURATION: ***  PLAN FOR NEXT SESSION: ***   [facility]  [address]  [place] [address]  [phone]  After Breast Cancer Class It is recommended you attend the ABC class to be educated on lymphedema risk reduction. This class is free of charge and lasts for 1 hour. It is a 1-time class. You will need to download the TEAMS app either on your phone or computer. We will send you a link the night before or the morning of the class. You should be able to click on that link to join the class. This is not a confidential class. You don't have to turn your camera on, but other participants may be able to see your email address.  Scar massage You can begin gentle scar massage to you incision sites. Gently place one hand on the incision and move the skin (without sliding on the skin) in various directions. Do this for a few minutes and then you can gently massage either coconut oil or vitamin E cream into the scars.  Compression garment You should continue wearing your compression bra until you feel like you no longer have swelling.  Home exercise Program Continue doing the exercises you were given until you feel like you can do them without feeling any tightness at the end.   Walking Program Studies show that 30  minutes of walking per day (fast enough to elevate your heart rate) can significantly reduce the risk of a cancer recurrence. If you can't walk due to other medical reasons, we encourage you to find another activity you could do (like a stationary bike or water exercise).  Posture After breast cancer surgery, people frequently sit with rounded shoulders posture because it puts their incisions on slack and feels better. If you sit like this and scar tissue forms in that position, you can become very tight and have pain sitting or standing with good posture. Try to be aware of your posture and sit and stand up tall to heal properly.  Follow up PT: It is recommended you return every 3 months for the first 3 years following surgery to be assessed on the SOZO machine for an L-Dex score. This helps prevent clinically significant lymphedema in 95% of patients. These follow up screens are 10 minute appointments that you are not billed for.  Waynette Buttery, PT 10/27/2022, 7:18 PM

## 2022-10-28 ENCOUNTER — Ambulatory Visit: Payer: Medicare HMO | Attending: General Surgery

## 2022-10-28 DIAGNOSIS — R293 Abnormal posture: Secondary | ICD-10-CM | POA: Diagnosis not present

## 2022-10-28 DIAGNOSIS — Z170 Estrogen receptor positive status [ER+]: Secondary | ICD-10-CM | POA: Diagnosis not present

## 2022-10-28 DIAGNOSIS — C50511 Malignant neoplasm of lower-outer quadrant of right female breast: Secondary | ICD-10-CM | POA: Diagnosis not present

## 2022-10-28 NOTE — Patient Instructions (Signed)
     [facility]  [address]  [place] [address]  [phone]  After Breast Cancer Class It is recommended you attend the ABC class to be educated on lymphedema risk reduction. This class is free of charge and lasts for 1 hour. It is a 1-time class. You will need to download the TEAMS app either on your phone or computer. We will send you a link the night before or the morning of the class. You should be able to click on that link to join the class. This is not a confidential class. You don't have to turn your camera on, but other participants may be able to see your email address.  Scar massage You can begin gentle scar massage to you incision sites. Gently place one hand on the incision and move the skin (without sliding on the skin) in various directions. Do this for a few minutes and then you can gently massage either coconut oil or vitamin E cream into the scars.  Compression garment You should continue wearing your compression bra until you feel like you no longer have swelling.  Home exercise Program Continue doing the exercises you were given until you feel like you can do them without feeling any tightness at the end.   Walking Program Studies show that 30 minutes of walking per day (fast enough to elevate your heart rate) can significantly reduce the risk of a cancer recurrence. If you can't walk due to other medical reasons, we encourage you to find another activity you could do (like a stationary bike or water exercise).  Posture After breast cancer surgery, people frequently sit with rounded shoulders posture because it puts their incisions on slack and feels better. If you sit like this and scar tissue forms in that position, you can become very tight and have pain sitting or standing with good posture. Try to be aware of your posture and sit and stand up tall to heal properly.  Follow up PT: It is recommended you return every 3 months for  the first 2 years following surgery to be assessed on the SOZO machine for an L-Dex score. This helps prevent clinically significant lymphedema in 95% of patients. These follow up screens are 10 minute appointments that you are not billed for.  

## 2022-11-08 ENCOUNTER — Encounter: Payer: Self-pay | Admitting: Hematology and Oncology

## 2022-11-11 DIAGNOSIS — C50411 Malignant neoplasm of upper-outer quadrant of right female breast: Secondary | ICD-10-CM | POA: Diagnosis not present

## 2022-11-11 DIAGNOSIS — Z170 Estrogen receptor positive status [ER+]: Secondary | ICD-10-CM | POA: Diagnosis not present

## 2022-11-12 ENCOUNTER — Encounter (HOSPITAL_COMMUNITY): Payer: Self-pay

## 2022-11-13 ENCOUNTER — Encounter: Payer: Self-pay | Admitting: *Deleted

## 2022-11-13 ENCOUNTER — Telehealth: Payer: Self-pay | Admitting: *Deleted

## 2022-11-13 DIAGNOSIS — Z170 Estrogen receptor positive status [ER+]: Secondary | ICD-10-CM

## 2022-11-13 NOTE — Telephone Encounter (Signed)
Received oncotype results of 12/3%. Patient will stay here for xrt before moving.  Referral placed for rad onc.

## 2022-11-14 ENCOUNTER — Ambulatory Visit
Admission: RE | Admit: 2022-11-14 | Discharge: 2022-11-14 | Disposition: A | Discharge status: Home / Self Care | Payer: Medicare HMO | Source: Ambulatory Visit | Attending: Radiation Oncology | Admitting: Radiation Oncology

## 2022-11-14 ENCOUNTER — Other Ambulatory Visit: Payer: Self-pay

## 2022-11-14 ENCOUNTER — Encounter: Payer: Self-pay | Admitting: Radiation Oncology

## 2022-11-14 VITALS — BP 129/82 | HR 84 | Temp 97.7°F | Resp 18 | Ht 68.0 in | Wt 227.5 lb

## 2022-11-14 DIAGNOSIS — M129 Arthropathy, unspecified: Secondary | ICD-10-CM | POA: Diagnosis not present

## 2022-11-14 DIAGNOSIS — C50411 Malignant neoplasm of upper-outer quadrant of right female breast: Secondary | ICD-10-CM | POA: Insufficient documentation

## 2022-11-14 DIAGNOSIS — E039 Hypothyroidism, unspecified: Secondary | ICD-10-CM | POA: Diagnosis not present

## 2022-11-14 DIAGNOSIS — Z79899 Other long term (current) drug therapy: Secondary | ICD-10-CM | POA: Insufficient documentation

## 2022-11-14 DIAGNOSIS — Z87891 Personal history of nicotine dependence: Secondary | ICD-10-CM | POA: Insufficient documentation

## 2022-11-14 DIAGNOSIS — Z170 Estrogen receptor positive status [ER+]: Secondary | ICD-10-CM | POA: Insufficient documentation

## 2022-11-14 DIAGNOSIS — Z79890 Hormone replacement therapy (postmenopausal): Secondary | ICD-10-CM | POA: Diagnosis not present

## 2022-11-14 DIAGNOSIS — E669 Obesity, unspecified: Secondary | ICD-10-CM | POA: Insufficient documentation

## 2022-11-14 DIAGNOSIS — Z7984 Long term (current) use of oral hypoglycemic drugs: Secondary | ICD-10-CM | POA: Diagnosis not present

## 2022-11-14 DIAGNOSIS — I10 Essential (primary) hypertension: Secondary | ICD-10-CM | POA: Insufficient documentation

## 2022-11-14 DIAGNOSIS — E785 Hyperlipidemia, unspecified: Secondary | ICD-10-CM | POA: Diagnosis not present

## 2022-11-14 NOTE — Progress Notes (Signed)
New Breast Cancer Diagnosis: Right Breast UOQ  Did patient present with symptoms (if so, please note symptoms) or screening mammography?: Screening mammogram detected right breast asymmetry.   Location and Extent of disease :right breast. Located in the upper outer quadrant, measured 1 cm in greatest dimension. Adenopathy no.  Histology per Pathology Report: grade 2, Invasive Ductal Carcinoma with DCIS 10/07/2022  Receptor Status: ER(positive), PR (positive), Her2-neu (positive), Ki-(10%)   Surgeon and surgical plan, if any:  Dr. Dwain Sarna -Right Breast Lumpectomy with radioactive seed and axillary SLN biopsy.  -Has had seroma drained twice.   Medical oncologist, treatment if any:   Dr. Pamelia Hoit 10/23/2022 Oncotype Dx to determine if she would benefit from chemotherapy- 12/3% Adjuvant radiation Adjuvant antiestrogen therapy   Family History of Breast/Ovarian/Prostate Cancer: No  Lymphedema issues, if any: She reports a little bit of swelling.     Pain issues, if any: She reports continued tenderness within her breast and underarm.     SAFETY ISSUES: Prior radiation? No Pacemaker/ICD? No Possible current pregnancy? Hysterectomy Is the patient on methotrexate? No  Current Complaints / other details:

## 2022-11-14 NOTE — Progress Notes (Signed)
Radiation Oncology         (336) [phone] ________________________________  Name: [name] [name]        MRN: [id number]  Date of Service: 11/14/2022 DOB: 05-24-1955  ZD:[id number], Barbarann Ehlers, MD  Serena Croissant, MD     REFERRING PHYSICIAN: Serena Croissant, MD   DIAGNOSIS: The encounter diagnosis was Malignant neoplasm of upper-outer quadrant of right breast in female, estrogen receptor positive (HCC).   HISTORY OF PRESENT ILLNESS: [name] [name] is a 67 y.o. female seen at the request of Dr. Pamelia Hoit for new diagnosis of right breast cancer.  The patient presented with screening detected asymmetry in the right breast.  There was no sonographic correlate but the area was approximately 7 mm by diagnostic mammography in the 4-5 o'clock axis.  The axilla was negative by ultrasound.  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  She proceeded with right lumpectomy with sentinel lymph node biopsy on 10/07/2022.  Final pathology showed grade 2 invasive ductal carcinoma measuring 1 cm in greatest dimension with associated intermediate grade DCIS.  Her margins were negative for invasive and in situ disease.  3 sentinel lymph nodes were negative for metastatic disease and additional medial superior and anterior margin excisions were also negative.  Her tumor was retested and was found to be ER/PR positive, HER2 was negative after FISH testing, the Ki-67 was 5%.  Her tumor was also tested for Oncotype Dx score and was 12.  No systemic chemotherapy is recommended and she is now seen to discuss adjuvant radiotherapy.   PREVIOUS RADIATION THERAPY: No   PAST MEDICAL HISTORY:  Past Medical History:  Diagnosis Date   Allergy    some statins   Arthritis forever   Cataract both have been removed   Glaucoma    HTN (hypertension) 04/04/2010   Qualifier: Diagnosis of  By: Cathey Endow DO, Karen     Hyperlipidemia    Hypothyroidism     Migraine    hx of   Migraines    Obesity    Obesity    Pre-diabetes        PAST SURGICAL HISTORY: Past Surgical History:  Procedure Laterality Date   ABDOMINAL HYSTERECTOMY  2005   complete   BREAST BIOPSY Right 09/18/2022   MM RT BREAST BX W LOC DEV 1ST LESION IMAGE BX SPEC STEREO GUIDE 09/18/2022 [facility]   BREAST BIOPSY  10/04/2022   MM RT RADIOACTIVE SEED LOC MAMMO GUIDE 10/04/2022 [facility]   BREAST LUMPECTOMY WITH RADIOACTIVE SEED AND SENTINEL LYMPH NODE BIOPSY Right 10/07/2022   Procedure: RIGHT BREAST LUMPECTOMY WITH RADIOACTIVE SEED AND AXILLARY SENTINEL LYMPH NODE BIOPSY;  Surgeon: Emelia Loron, MD;  Location: [facility];  Service: General;  Laterality: Right;   CATARACT EXTRACTION W/ INTRAOCULAR LENS IMPLANT Left 05/10/2014   dexa  08/2005   (+) 1.7   EYE SURGERY  lasik   plus 2 cataract removals   JOINT REPLACEMENT     L hip replacement 2011   migraines, resolved  2003   Right foot surgery  2000   callus removed   TOTAL ABDOMINAL HYSTERECTOMY  2005     FAMILY HISTORY:  Family History  Problem Relation Age of Onset   Heart attack Mother    CAD Mother        stents   Hyperlipidemia Mother    Arthritis Mother    Heart disease Mother    Other  Father 37       stents   Hypertension Father    Arthritis Father    Diabetes Father    Heart disease Father    Arthritis Sister    Obesity Sister    Hyperlipidemia Brother    Vision loss Maternal Aunt      SOCIAL HISTORY:  reports that she quit smoking about 28 years ago. Her smoking use included cigarettes. She has a 15.00 pack-year smoking history. She has never used smokeless tobacco. She reports current alcohol use of about 6.0 standard drinks of alcohol per week. She reports that she does not use drugs.   ALLERGIES: Lipitor [atorvastatin], Statins, Dorzolamide hydrochloride [dorzolamide], Lisinopril, Simvastatin, and Livalo [pitavastatin]   MEDICATIONS:  Current  Outpatient Medications  Medication Sig Dispense Refill   amLODipine (NORVASC) 5 MG tablet TAKE 1 TABLET EVERY DAY 90 tablet 3   Calcium Carbonate (CALCIUM 600 PO) Take by mouth.     Cholecalciferol 125 MCG (5000 UT) capsule Take 5,000 Units by mouth daily.     fish oil-omega-3 fatty acids 1000 MG capsule Take 1 g by mouth 2 (two) times daily.      latanoprost (XALATAN) 0.005 % ophthalmic solution Place 1 drop into both eyes at bedtime.     levothyroxine (SYNTHROID) 125 MCG tablet TAKE 1 TABLET EVERY DAY 90 tablet 3   losartan-hydrochlorothiazide (HYZAAR) 100-25 MG tablet TAKE 1 TABLET EVERY DAY 90 tablet 3   metFORMIN (GLUCOPHAGE) 500 MG tablet TAKE 1 TABLET TWICE DAILY WITH MEALS 180 tablet 3   Multiple Vitamin (MULTIVITAMIN) capsule Take 1 capsule by mouth daily.     rosuvastatin (CRESTOR) 5 MG tablet TAKE 1 TABLET AT BEDTIME 90 tablet 3   timolol (BETIMOL) 0.5 % ophthalmic solution Place 1 drop into both eyes daily. AM     No current facility-administered medications for this visit.     REVIEW OF SYSTEMS: On review of systems, the patient reports that she is doing pretty well since surgery. She still has some fullness in her right axilla and stopped using a wedge insert from PT after her swelling improved initially but will go back to using this. No breast specific complaints are noted. She is planning to move to [place] as soon as she finishes radiation.      PHYSICAL EXAM:  Wt Readings from Last 3 Encounters:  10/15/22 227 lb 9.6 oz (103.2 kg)  10/07/22 227 lb 15.3 oz (103.4 kg)  09/26/22 225 lb 4.8 oz (102.2 kg)   Temp Readings from Last 3 Encounters:  10/15/22 (!) 96.8 F (36 C) (Tympanic)  10/07/22 (!) 97.5 F (36.4 C)  09/26/22 97.9 F (36.6 C) (Temporal)   BP Readings from Last 3 Encounters:  10/15/22 130/68  10/07/22 (!) 141/84  09/26/22 (!) 156/75   Pulse Readings from Last 3 Encounters:  10/15/22 83  10/07/22 67  09/26/22 95    In general this is a well  appearing caucasian female in no acute distress. She's alert and oriented x4 and appropriate throughout the examination. Cardiopulmonary assessment is negative for acute distress and she exhibits normal effort. The right breast incision site is well healed without erythema separation or drainage. There is capillary congestion without any warmth or cellulitic change at the inferior aspect of her axillary incision that is otherwise well healed. There is fullness that is indistinct in the right axilla above the incision site.    ECOG = 1  0 - Asymptomatic (Fully active, able to carry on all  predisease activities without restriction)  1 - Symptomatic but completely ambulatory (Restricted in physically strenuous activity but ambulatory and able to carry out work of a light or sedentary nature. For example, light housework, office work)  2 - Symptomatic, <50% in bed during the day (Ambulatory and capable of all self care but unable to carry out any work activities. Up and about more than 50% of waking hours)  3 - Symptomatic, >50% in bed, but not bedbound (Capable of only limited self-care, confined to bed or chair 50% or more of waking hours)  4 - Bedbound (Completely disabled. Cannot carry on any self-care. Totally confined to bed or chair)  5 - Death   Santiago Glad MM, Creech RH, Tormey DC, et al. [phone]). "Toxicity and response criteria of the [facility] [employer]". Am. Evlyn Clines. Oncol. 5 (6): 649-55    LABORATORY DATA:  Lab Results  Component Value Date   WBC 6.0 09/02/2022   HGB 14.4 09/02/2022   HCT 44.3 09/02/2022   MCV 89.3 09/02/2022   PLT 352 09/02/2022   Lab Results  Component Value Date   NA 136 10/01/2022   K 4.2 10/01/2022   CL 102 10/01/2022   CO2 22 10/01/2022   Lab Results  Component Value Date   ALT 21 09/02/2022   AST 17 09/02/2022   ALKPHOS 75 11/25/2016   BILITOT 0.5 09/02/2022      RADIOGRAPHY: No results found.     IMPRESSION/PLAN: 1. 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 Dr. Mitzi Hansen discusses the pathology findings and reviews the nature of early stage right breast disease. The patient has done well since surgery and does not need systemic chemotherapy after retesting prognostics and running Oncotype Dx scoring. Dr. Mitzi Hansen would recommend external radiotherapy to the breast  to reduce risks of local recurrence followed by antiestrogen therapy. We discussed the risks, benefits, short, and long term effects of radiotherapy, as well as the curative intent, and the patient is interested in proceeding. Dr. Mitzi Hansen discusses the delivery and logistics of radiotherapy and anticipates a course of 4 weeks of radiotherapy to the right breast. Written consent is obtained and placed in the chart, a copy was provided to the patient. The patient will simulate today. 2. Axillary fullness. I encouraged the patient to resume the use of her foam insert she received from PT. If her symptoms of fullness haven't improved by early next week she will call PT and Dr. Dwain Sarna.     In a visit lasting 60 minutes, greater than 50% of the time was spent face to face reviewing her case, as well as in preparation of, discussing, and coordinating the patient's care.  The above documentation reflects my direct findings during this shared patient visit. Please see the separate note by Dr. Mitzi Hansen on this date for the remainder of the patient's plan of care.    Osker Mason, [facility]    **Disclaimer: This note was dictated with voice recognition software. Similar sounding words can inadvertently be transcribed and this note may contain transcription errors which may not have been corrected upon publication of note.**

## 2022-11-15 ENCOUNTER — Encounter: Payer: Self-pay | Admitting: Radiation Oncology

## 2022-11-15 DIAGNOSIS — C50411 Malignant neoplasm of upper-outer quadrant of right female breast: Secondary | ICD-10-CM | POA: Diagnosis not present

## 2022-11-15 DIAGNOSIS — Z170 Estrogen receptor positive status [ER+]: Secondary | ICD-10-CM | POA: Diagnosis not present

## 2022-11-15 NOTE — Addendum Note (Signed)
Encounter addended by: Dorothy Puffer, MD on: 11/15/2022 10:54 AM  Actions taken: Edit attestation on clinical note

## 2022-11-18 ENCOUNTER — Encounter: Payer: Self-pay | Admitting: Radiation Oncology

## 2022-11-20 ENCOUNTER — Encounter: Payer: Self-pay | Admitting: *Deleted

## 2022-11-22 ENCOUNTER — Telehealth: Payer: Self-pay | Admitting: Hematology and Oncology

## 2022-11-22 NOTE — Telephone Encounter (Signed)
Scheduled appointment per scheduling message. Patient is aware of the made appointment. 

## 2022-11-25 ENCOUNTER — Other Ambulatory Visit: Payer: Self-pay

## 2022-11-25 ENCOUNTER — Ambulatory Visit
Admission: RE | Admit: 2022-11-25 | Discharge: 2022-11-25 | Disposition: A | Discharge status: Home / Self Care | Payer: Medicare HMO | Source: Ambulatory Visit | Attending: Radiation Oncology | Admitting: Radiation Oncology

## 2022-11-25 DIAGNOSIS — Z170 Estrogen receptor positive status [ER+]: Secondary | ICD-10-CM | POA: Diagnosis not present

## 2022-11-25 DIAGNOSIS — C50411 Malignant neoplasm of upper-outer quadrant of right female breast: Secondary | ICD-10-CM | POA: Insufficient documentation

## 2022-11-25 DIAGNOSIS — Z510 Encounter for antineoplastic radiation therapy: Secondary | ICD-10-CM | POA: Diagnosis not present

## 2022-11-25 LAB — RAD ONC ARIA SESSION SUMMARY
Course Elapsed Days: 0
Plan Fractions Treated to Date: 1
Plan Prescribed Dose Per Fraction: 2.66 Gy
Plan Total Fractions Prescribed: 16
Plan Total Prescribed Dose: 42.56 Gy
Reference Point Dosage Given to Date: 2.66 Gy
Reference Point Session Dosage Given: 2.66 Gy
Session Number: 1

## 2022-11-26 ENCOUNTER — Ambulatory Visit: Admission: RE | Admit: 2022-11-26 | Payer: Medicare HMO | Source: Ambulatory Visit

## 2022-11-26 ENCOUNTER — Other Ambulatory Visit: Payer: Self-pay

## 2022-11-26 DIAGNOSIS — Z170 Estrogen receptor positive status [ER+]: Secondary | ICD-10-CM | POA: Diagnosis not present

## 2022-11-26 DIAGNOSIS — C50411 Malignant neoplasm of upper-outer quadrant of right female breast: Secondary | ICD-10-CM | POA: Diagnosis not present

## 2022-11-26 DIAGNOSIS — Z510 Encounter for antineoplastic radiation therapy: Secondary | ICD-10-CM | POA: Diagnosis not present

## 2022-11-26 LAB — RAD ONC ARIA SESSION SUMMARY
Course Elapsed Days: 1
Plan Fractions Treated to Date: 2
Plan Prescribed Dose Per Fraction: 2.66 Gy
Plan Total Fractions Prescribed: 16
Plan Total Prescribed Dose: 42.56 Gy
Reference Point Dosage Given to Date: 5.32 Gy
Reference Point Session Dosage Given: 2.66 Gy
Session Number: 2

## 2022-11-27 ENCOUNTER — Other Ambulatory Visit: Payer: Self-pay

## 2022-11-27 ENCOUNTER — Ambulatory Visit
Admission: RE | Admit: 2022-11-27 | Discharge: 2022-11-27 | Disposition: A | Discharge status: Home / Self Care | Payer: Medicare HMO | Source: Ambulatory Visit | Attending: Radiation Oncology | Admitting: Radiation Oncology

## 2022-11-27 DIAGNOSIS — Z510 Encounter for antineoplastic radiation therapy: Secondary | ICD-10-CM | POA: Diagnosis not present

## 2022-11-27 DIAGNOSIS — C50411 Malignant neoplasm of upper-outer quadrant of right female breast: Secondary | ICD-10-CM | POA: Diagnosis not present

## 2022-11-27 DIAGNOSIS — Z170 Estrogen receptor positive status [ER+]: Secondary | ICD-10-CM | POA: Diagnosis not present

## 2022-11-27 LAB — RAD ONC ARIA SESSION SUMMARY
Course Elapsed Days: 2
Plan Fractions Treated to Date: 3
Plan Prescribed Dose Per Fraction: 2.66 Gy
Plan Total Fractions Prescribed: 16
Plan Total Prescribed Dose: 42.56 Gy
Reference Point Dosage Given to Date: 7.98 Gy
Reference Point Session Dosage Given: 2.66 Gy
Session Number: 3

## 2022-11-28 ENCOUNTER — Ambulatory Visit
Admission: RE | Admit: 2022-11-28 | Discharge: 2022-11-28 | Disposition: A | Discharge status: Home / Self Care | Payer: Medicare HMO | Source: Ambulatory Visit | Attending: Radiation Oncology | Admitting: Radiation Oncology

## 2022-11-28 ENCOUNTER — Other Ambulatory Visit: Payer: Self-pay

## 2022-11-28 DIAGNOSIS — Z170 Estrogen receptor positive status [ER+]: Secondary | ICD-10-CM | POA: Diagnosis not present

## 2022-11-28 DIAGNOSIS — C50411 Malignant neoplasm of upper-outer quadrant of right female breast: Secondary | ICD-10-CM | POA: Diagnosis not present

## 2022-11-28 DIAGNOSIS — Z510 Encounter for antineoplastic radiation therapy: Secondary | ICD-10-CM | POA: Diagnosis not present

## 2022-11-28 LAB — RAD ONC ARIA SESSION SUMMARY
Course Elapsed Days: 3
Plan Fractions Treated to Date: 4
Plan Prescribed Dose Per Fraction: 2.66 Gy
Plan Total Fractions Prescribed: 16
Plan Total Prescribed Dose: 42.56 Gy
Reference Point Dosage Given to Date: 10.64 Gy
Reference Point Session Dosage Given: 2.66 Gy
Session Number: 4

## 2022-11-29 ENCOUNTER — Ambulatory Visit
Admission: RE | Admit: 2022-11-29 | Discharge: 2022-11-29 | Disposition: A | Discharge status: Home / Self Care | Payer: Medicare HMO | Source: Ambulatory Visit | Attending: Radiation Oncology | Admitting: Radiation Oncology

## 2022-11-29 ENCOUNTER — Ambulatory Visit: Admission: RE | Admit: 2022-11-29 | Payer: Medicare HMO | Source: Ambulatory Visit

## 2022-11-29 ENCOUNTER — Other Ambulatory Visit: Payer: Self-pay

## 2022-11-29 DIAGNOSIS — Z170 Estrogen receptor positive status [ER+]: Secondary | ICD-10-CM | POA: Diagnosis not present

## 2022-11-29 DIAGNOSIS — C50411 Malignant neoplasm of upper-outer quadrant of right female breast: Secondary | ICD-10-CM | POA: Diagnosis not present

## 2022-11-29 DIAGNOSIS — Z510 Encounter for antineoplastic radiation therapy: Secondary | ICD-10-CM | POA: Diagnosis not present

## 2022-11-29 LAB — RAD ONC ARIA SESSION SUMMARY
Course Elapsed Days: 4
Plan Fractions Treated to Date: 5
Plan Prescribed Dose Per Fraction: 2.66 Gy
Plan Total Fractions Prescribed: 16
Plan Total Prescribed Dose: 42.56 Gy
Reference Point Dosage Given to Date: 13.3 Gy
Reference Point Session Dosage Given: 2.66 Gy
Session Number: 5

## 2022-11-29 MED ORDER — ALRA NON-METALLIC DEODORANT (RAD-ONC): 1.0000 | Freq: Once | TOPICAL | Status: DC

## 2022-11-29 MED ORDER — RADIAPLEXRX EX GEL: Freq: Once | CUTANEOUS | Status: AC

## 2022-12-02 ENCOUNTER — Other Ambulatory Visit: Payer: Self-pay

## 2022-12-02 ENCOUNTER — Telehealth: Payer: Self-pay

## 2022-12-02 ENCOUNTER — Ambulatory Visit: Admission: RE | Admit: 2022-12-02 | Payer: Medicare HMO | Source: Ambulatory Visit

## 2022-12-02 ENCOUNTER — Encounter: Payer: Self-pay | Admitting: Hematology and Oncology

## 2022-12-02 DIAGNOSIS — Z170 Estrogen receptor positive status [ER+]: Secondary | ICD-10-CM | POA: Diagnosis not present

## 2022-12-02 DIAGNOSIS — Z510 Encounter for antineoplastic radiation therapy: Secondary | ICD-10-CM | POA: Diagnosis not present

## 2022-12-02 DIAGNOSIS — C50411 Malignant neoplasm of upper-outer quadrant of right female breast: Secondary | ICD-10-CM | POA: Diagnosis not present

## 2022-12-02 LAB — RAD ONC ARIA SESSION SUMMARY
Course Elapsed Days: 7
Plan Fractions Treated to Date: 6
Plan Prescribed Dose Per Fraction: 2.66 Gy
Plan Total Fractions Prescribed: 16
Plan Total Prescribed Dose: 42.56 Gy
Reference Point Dosage Given to Date: 15.96 Gy
Reference Point Session Dosage Given: 2.66 Gy
Session Number: 6

## 2022-12-02 NOTE — Telephone Encounter (Signed)
Called Pt regarding MyChart message. Advised Pt that she will need to find an oncologist in [place], [place]. Once she finds an oncologist, she can give [place] their information and we will fax over all the information needed. Pt states she has friends in [place] who have been treated for breast cancer and will ask them for recommendation. Advised Pt she will discuss anti-estrogen therapy at 12/19/22 MD visit. Pt verbalized understanding.

## 2022-12-03 ENCOUNTER — Other Ambulatory Visit: Payer: Self-pay

## 2022-12-03 ENCOUNTER — Ambulatory Visit
Admission: RE | Admit: 2022-12-03 | Discharge: 2022-12-03 | Disposition: A | Discharge status: Home / Self Care | Payer: Medicare HMO | Source: Ambulatory Visit | Attending: Radiation Oncology | Admitting: Radiation Oncology

## 2022-12-03 DIAGNOSIS — Z170 Estrogen receptor positive status [ER+]: Secondary | ICD-10-CM | POA: Diagnosis not present

## 2022-12-03 DIAGNOSIS — Z510 Encounter for antineoplastic radiation therapy: Secondary | ICD-10-CM | POA: Diagnosis not present

## 2022-12-03 DIAGNOSIS — C50411 Malignant neoplasm of upper-outer quadrant of right female breast: Secondary | ICD-10-CM | POA: Diagnosis not present

## 2022-12-03 LAB — RAD ONC ARIA SESSION SUMMARY
Course Elapsed Days: 8
Plan Fractions Treated to Date: 7
Plan Prescribed Dose Per Fraction: 2.66 Gy
Plan Total Fractions Prescribed: 16
Plan Total Prescribed Dose: 42.56 Gy
Reference Point Dosage Given to Date: 18.62 Gy
Reference Point Session Dosage Given: 2.66 Gy
Session Number: 7

## 2022-12-04 ENCOUNTER — Ambulatory Visit
Admission: RE | Admit: 2022-12-04 | Discharge: 2022-12-04 | Disposition: A | Discharge status: Home / Self Care | Payer: Medicare HMO | Source: Ambulatory Visit | Attending: Radiation Oncology | Admitting: Radiation Oncology

## 2022-12-04 ENCOUNTER — Other Ambulatory Visit: Payer: Self-pay

## 2022-12-04 DIAGNOSIS — C50411 Malignant neoplasm of upper-outer quadrant of right female breast: Secondary | ICD-10-CM | POA: Diagnosis not present

## 2022-12-04 DIAGNOSIS — Z510 Encounter for antineoplastic radiation therapy: Secondary | ICD-10-CM | POA: Diagnosis not present

## 2022-12-04 DIAGNOSIS — Z170 Estrogen receptor positive status [ER+]: Secondary | ICD-10-CM | POA: Diagnosis not present

## 2022-12-04 LAB — RAD ONC ARIA SESSION SUMMARY
Course Elapsed Days: 9
Plan Fractions Treated to Date: 8
Plan Prescribed Dose Per Fraction: 2.66 Gy
Plan Total Fractions Prescribed: 16
Plan Total Prescribed Dose: 42.56 Gy
Reference Point Dosage Given to Date: 21.28 Gy
Reference Point Session Dosage Given: 2.66 Gy
Session Number: 8

## 2022-12-05 ENCOUNTER — Ambulatory Visit
Admission: RE | Admit: 2022-12-05 | Discharge: 2022-12-05 | Disposition: A | Discharge status: Home / Self Care | Payer: Medicare HMO | Source: Ambulatory Visit | Attending: Radiation Oncology | Admitting: Radiation Oncology

## 2022-12-05 ENCOUNTER — Other Ambulatory Visit: Payer: Self-pay

## 2022-12-05 DIAGNOSIS — Z170 Estrogen receptor positive status [ER+]: Secondary | ICD-10-CM | POA: Diagnosis not present

## 2022-12-05 DIAGNOSIS — C50411 Malignant neoplasm of upper-outer quadrant of right female breast: Secondary | ICD-10-CM | POA: Diagnosis not present

## 2022-12-05 DIAGNOSIS — Z510 Encounter for antineoplastic radiation therapy: Secondary | ICD-10-CM | POA: Diagnosis not present

## 2022-12-05 LAB — RAD ONC ARIA SESSION SUMMARY
Course Elapsed Days: 10
Plan Fractions Treated to Date: 9
Plan Prescribed Dose Per Fraction: 2.66 Gy
Plan Total Fractions Prescribed: 16
Plan Total Prescribed Dose: 42.56 Gy
Reference Point Dosage Given to Date: 23.94 Gy
Reference Point Session Dosage Given: 2.66 Gy
Session Number: 9

## 2022-12-06 ENCOUNTER — Ambulatory Visit: Payer: Medicare HMO

## 2022-12-07 ENCOUNTER — Encounter: Payer: Self-pay | Admitting: Hematology and Oncology

## 2022-12-09 ENCOUNTER — Ambulatory Visit: Admission: RE | Admit: 2022-12-09 | Payer: Medicare HMO | Source: Ambulatory Visit

## 2022-12-09 ENCOUNTER — Other Ambulatory Visit: Payer: Self-pay

## 2022-12-09 ENCOUNTER — Telehealth: Payer: Self-pay

## 2022-12-09 ENCOUNTER — Ambulatory Visit
Admission: RE | Admit: 2022-12-09 | Discharge: 2022-12-09 | Disposition: A | Discharge status: Home / Self Care | Payer: Medicare HMO | Source: Ambulatory Visit | Attending: Radiation Oncology | Admitting: Radiation Oncology

## 2022-12-09 DIAGNOSIS — C50411 Malignant neoplasm of upper-outer quadrant of right female breast: Secondary | ICD-10-CM | POA: Diagnosis not present

## 2022-12-09 DIAGNOSIS — Z510 Encounter for antineoplastic radiation therapy: Secondary | ICD-10-CM | POA: Diagnosis not present

## 2022-12-09 DIAGNOSIS — Z170 Estrogen receptor positive status [ER+]: Secondary | ICD-10-CM | POA: Diagnosis not present

## 2022-12-09 LAB — RAD ONC ARIA SESSION SUMMARY
Course Elapsed Days: 14
Plan Fractions Treated to Date: 10
Plan Prescribed Dose Per Fraction: 2.66 Gy
Plan Total Fractions Prescribed: 16
Plan Total Prescribed Dose: 42.56 Gy
Reference Point Dosage Given to Date: 26.6 Gy
Reference Point Session Dosage Given: 2.66 Gy
Session Number: 10

## 2022-12-09 NOTE — Telephone Encounter (Signed)
Called Pt regarding MyChart message. Radiation appts have already been rescheduled, MD appt will stay as is d/t full schedule. Pt verbalized understanding.

## 2022-12-10 ENCOUNTER — Other Ambulatory Visit: Payer: Self-pay

## 2022-12-10 ENCOUNTER — Ambulatory Visit
Admission: RE | Admit: 2022-12-10 | Discharge: 2022-12-10 | Disposition: A | Discharge status: Home / Self Care | Payer: Medicare HMO | Source: Ambulatory Visit | Attending: Radiation Oncology | Admitting: Radiation Oncology

## 2022-12-10 DIAGNOSIS — C50411 Malignant neoplasm of upper-outer quadrant of right female breast: Secondary | ICD-10-CM | POA: Diagnosis not present

## 2022-12-10 DIAGNOSIS — Z170 Estrogen receptor positive status [ER+]: Secondary | ICD-10-CM | POA: Diagnosis not present

## 2022-12-10 DIAGNOSIS — Z510 Encounter for antineoplastic radiation therapy: Secondary | ICD-10-CM | POA: Diagnosis not present

## 2022-12-10 LAB — RAD ONC ARIA SESSION SUMMARY
Course Elapsed Days: 15
Plan Fractions Treated to Date: 11
Plan Prescribed Dose Per Fraction: 2.66 Gy
Plan Total Fractions Prescribed: 16
Plan Total Prescribed Dose: 42.56 Gy
Reference Point Dosage Given to Date: 29.26 Gy
Reference Point Session Dosage Given: 2.66 Gy
Session Number: 11

## 2022-12-11 ENCOUNTER — Ambulatory Visit: Admission: RE | Admit: 2022-12-11 | Payer: Medicare HMO | Source: Ambulatory Visit

## 2022-12-11 ENCOUNTER — Other Ambulatory Visit: Payer: Self-pay

## 2022-12-11 DIAGNOSIS — Z170 Estrogen receptor positive status [ER+]: Secondary | ICD-10-CM | POA: Diagnosis not present

## 2022-12-11 DIAGNOSIS — Z510 Encounter for antineoplastic radiation therapy: Secondary | ICD-10-CM | POA: Diagnosis not present

## 2022-12-11 DIAGNOSIS — C50411 Malignant neoplasm of upper-outer quadrant of right female breast: Secondary | ICD-10-CM | POA: Diagnosis not present

## 2022-12-11 LAB — RAD ONC ARIA SESSION SUMMARY
Course Elapsed Days: 16
Plan Fractions Treated to Date: 12
Plan Prescribed Dose Per Fraction: 2.66 Gy
Plan Total Fractions Prescribed: 16
Plan Total Prescribed Dose: 42.56 Gy
Reference Point Dosage Given to Date: 31.92 Gy
Reference Point Session Dosage Given: 2.66 Gy
Session Number: 12

## 2022-12-12 ENCOUNTER — Ambulatory Visit
Admission: RE | Admit: 2022-12-12 | Discharge: 2022-12-12 | Disposition: A | Discharge status: Home / Self Care | Payer: Medicare HMO | Source: Ambulatory Visit | Attending: Radiation Oncology | Admitting: Radiation Oncology

## 2022-12-12 ENCOUNTER — Other Ambulatory Visit: Payer: Self-pay

## 2022-12-12 DIAGNOSIS — C50411 Malignant neoplasm of upper-outer quadrant of right female breast: Secondary | ICD-10-CM | POA: Diagnosis not present

## 2022-12-12 DIAGNOSIS — Z510 Encounter for antineoplastic radiation therapy: Secondary | ICD-10-CM | POA: Diagnosis not present

## 2022-12-12 DIAGNOSIS — Z170 Estrogen receptor positive status [ER+]: Secondary | ICD-10-CM | POA: Diagnosis not present

## 2022-12-12 LAB — RAD ONC ARIA SESSION SUMMARY
Course Elapsed Days: 17
Plan Fractions Treated to Date: 13
Plan Prescribed Dose Per Fraction: 2.66 Gy
Plan Total Fractions Prescribed: 16
Plan Total Prescribed Dose: 42.56 Gy
Reference Point Dosage Given to Date: 34.58 Gy
Reference Point Session Dosage Given: 2.66 Gy
Session Number: 13

## 2022-12-13 ENCOUNTER — Other Ambulatory Visit: Payer: Self-pay

## 2022-12-13 ENCOUNTER — Ambulatory Visit: Admission: RE | Admit: 2022-12-13 | Payer: Medicare HMO | Source: Ambulatory Visit

## 2022-12-13 ENCOUNTER — Ambulatory Visit: Payer: Medicare HMO | Admitting: Radiation Oncology

## 2022-12-13 DIAGNOSIS — Z510 Encounter for antineoplastic radiation therapy: Secondary | ICD-10-CM | POA: Diagnosis not present

## 2022-12-13 DIAGNOSIS — Z170 Estrogen receptor positive status [ER+]: Secondary | ICD-10-CM | POA: Diagnosis not present

## 2022-12-13 DIAGNOSIS — C50411 Malignant neoplasm of upper-outer quadrant of right female breast: Secondary | ICD-10-CM | POA: Diagnosis not present

## 2022-12-13 LAB — RAD ONC ARIA SESSION SUMMARY
Course Elapsed Days: 18
Plan Fractions Treated to Date: 14
Plan Prescribed Dose Per Fraction: 2.66 Gy
Plan Total Fractions Prescribed: 16
Plan Total Prescribed Dose: 42.56 Gy
Reference Point Dosage Given to Date: 37.24 Gy
Reference Point Session Dosage Given: 2.66 Gy
Session Number: 14

## 2022-12-15 NOTE — Progress Notes (Signed)
Patient Care Team: Agapito Games, MD as PCP - General (Family Medicine) Pershing Proud, RN as Oncology Nurse Navigator Donnelly Angelica, RN as Oncology Nurse Navigator Serena Croissant, MD as Consulting Physician (Hematology and Oncology)  DIAGNOSIS: No diagnosis found.  SUMMARY OF ONCOLOGIC HISTORY: Oncology History  Malignant neoplasm of upper-outer quadrant of right breast in female, estrogen receptor positive (HCC)  09/18/2022 Initial Diagnosis   Screening mammogram detected right breast asymmetry without any sonographic correlate 7 mm, biopsy: Grade 2 IDC ER 95%, PR 80%, Ki-67 10%, HER2 positive (3+)   10/07/2022 Surgery   Right lumpectomy: 1 cm grade 2 IDC with DCIS, margins negative, ER 95%, PR 80%, HER2 positive 3+, Ki-67 10%, 0/3 lymph nodes negative Repeat testing of prognostic panel: ER 100%, PR 100%, Ki67 5%, HER2 equivocal 2+, FISH negative ratio 1.51)   11/14/2022 Cancer Staging   Staging form: Breast, AJCC 8th Edition - Pathologic: Stage IA (pT1b, pN0(sn), cM0, G2, ER+, PR+, HER2-, Oncotype DX score: 12) - Signed by Ronny Bacon, PA-C on 11/14/2022 Method of lymph node assessment: Sentinel lymph node biopsy Multigene prognostic tests performed: Oncotype DX Recurrence score range: Greater than or equal to 11 Histologic grading system: 3 grade system     CHIEF COMPLIANT: Post radiation  INTERVAL HISTORY: [name] [name] is a 67 y.o. female is here because of recent diagnosis of right breast cancer. She presents to the clinic for a follow-up.    ALLERGIES:  is allergic to lipitor [atorvastatin], statins, dorzolamide hydrochloride [dorzolamide], lisinopril, simvastatin, and livalo [pitavastatin].  MEDICATIONS:  Current Outpatient Medications  Medication Sig Dispense Refill   amLODipine (NORVASC) 5 MG tablet TAKE 1 TABLET EVERY DAY 90 tablet 3   Calcium Carbonate (CALCIUM 600 PO) Take by mouth.     Cholecalciferol 125 MCG (5000 UT) capsule Take 5,000  Units by mouth daily.     fish oil-omega-3 fatty acids 1000 MG capsule Take 1 g by mouth 2 (two) times daily.      latanoprost (XALATAN) 0.005 % ophthalmic solution Place 1 drop into both eyes at bedtime.     levothyroxine (SYNTHROID) 125 MCG tablet TAKE 1 TABLET EVERY DAY 90 tablet 3   losartan-hydrochlorothiazide (HYZAAR) 100-25 MG tablet TAKE 1 TABLET EVERY DAY 90 tablet 3   metFORMIN (GLUCOPHAGE) 500 MG tablet TAKE 1 TABLET TWICE DAILY WITH MEALS 180 tablet 3   Multiple Vitamin (MULTIVITAMIN) capsule Take 1 capsule by mouth daily.     rosuvastatin (CRESTOR) 5 MG tablet TAKE 1 TABLET AT BEDTIME 90 tablet 3   timolol (BETIMOL) 0.5 % ophthalmic solution Place 1 drop into both eyes daily. AM     No current facility-administered medications for this visit.    PHYSICAL EXAMINATION: ECOG PERFORMANCE STATUS: {CHL ONC ECOG [phone]}  There were no vitals filed for this visit. There were no vitals filed for this visit.  BREAST:*** No palpable masses or nodules in either right or left breasts. No palpable axillary supraclavicular or infraclavicular adenopathy no breast tenderness or nipple discharge. (exam performed in the presence of a chaperone)  LABORATORY DATA:  I have reviewed the data as listed    Latest Ref Rng & Units 10/01/2022    1:00 PM 09/02/2022    9:03 AM 02/19/2022    9:56 AM  CMP  Glucose 70 - 99 mg/dL 376  283  151   BUN 8 - 23 mg/dL 18  18  17    Creatinine 0.44 - 1.00 mg/dL 7.61  6.07  0.65   Sodium 135 - 145 mmol/L 136  140  138   Potassium 3.5 - 5.1 mmol/L 4.2  4.5  4.4   Chloride 98 - 111 mmol/L 102  103  103   CO2 22 - 32 mmol/L 22  27  25    Calcium 8.9 - 10.3 mg/dL 9.4  16.1  09.6   Total Protein 6.1 - 8.1 g/dL  7.1    Total Bilirubin 0.2 - 1.2 mg/dL  0.5    AST 10 - 35 U/L  17    ALT 6 - 29 U/L  21      Lab Results  Component Value Date   WBC 6.0 09/02/2022   HGB 14.4 09/02/2022   HCT 44.3 09/02/2022   MCV 89.3 09/02/2022   PLT 352 09/02/2022    NEUTROABS 3.7 12/26/2014    ASSESSMENT & PLAN:  No problem-specific Assessment & Plan notes found for this encounter.    No orders of the defined types were placed in this encounter.  The patient has a good understanding of the overall plan. she agrees with it. she will call with any problems that may develop before the next visit here. Total time spent: 30 mins including face to face time and time spent for planning, charting and co-ordination of care   Sherlyn Lick, CMA 12/15/22    I Janan Ridge am acting as a Neurosurgeon for [employer]  ***

## 2022-12-16 ENCOUNTER — Ambulatory Visit
Admission: RE | Admit: 2022-12-16 | Discharge: 2022-12-16 | Disposition: A | Discharge status: Home / Self Care | Payer: Medicare HMO | Source: Ambulatory Visit | Attending: Radiation Oncology | Admitting: Radiation Oncology

## 2022-12-16 ENCOUNTER — Other Ambulatory Visit: Payer: Self-pay

## 2022-12-16 DIAGNOSIS — Z170 Estrogen receptor positive status [ER+]: Secondary | ICD-10-CM | POA: Diagnosis not present

## 2022-12-16 DIAGNOSIS — C50411 Malignant neoplasm of upper-outer quadrant of right female breast: Secondary | ICD-10-CM | POA: Diagnosis not present

## 2022-12-16 DIAGNOSIS — Z510 Encounter for antineoplastic radiation therapy: Secondary | ICD-10-CM | POA: Diagnosis not present

## 2022-12-16 LAB — RAD ONC ARIA SESSION SUMMARY
Course Elapsed Days: 21
Plan Fractions Treated to Date: 15
Plan Prescribed Dose Per Fraction: 2.66 Gy
Plan Total Fractions Prescribed: 16
Plan Total Prescribed Dose: 42.56 Gy
Reference Point Dosage Given to Date: 39.9 Gy
Reference Point Session Dosage Given: 2.66 Gy
Session Number: 15

## 2022-12-17 ENCOUNTER — Other Ambulatory Visit: Payer: Self-pay

## 2022-12-17 ENCOUNTER — Ambulatory Visit
Admission: RE | Admit: 2022-12-17 | Discharge: 2022-12-17 | Disposition: A | Discharge status: Home / Self Care | Payer: Medicare HMO | Source: Ambulatory Visit | Attending: Radiation Oncology | Admitting: Radiation Oncology

## 2022-12-17 DIAGNOSIS — Z510 Encounter for antineoplastic radiation therapy: Secondary | ICD-10-CM | POA: Diagnosis not present

## 2022-12-17 DIAGNOSIS — Z170 Estrogen receptor positive status [ER+]: Secondary | ICD-10-CM | POA: Diagnosis not present

## 2022-12-17 DIAGNOSIS — C50411 Malignant neoplasm of upper-outer quadrant of right female breast: Secondary | ICD-10-CM | POA: Diagnosis not present

## 2022-12-17 LAB — RAD ONC ARIA SESSION SUMMARY
Course Elapsed Days: 22
Plan Fractions Treated to Date: 16
Plan Prescribed Dose Per Fraction: 2.66 Gy
Plan Total Fractions Prescribed: 16
Plan Total Prescribed Dose: 42.56 Gy
Reference Point Dosage Given to Date: 42.56 Gy
Reference Point Session Dosage Given: 2.66 Gy
Session Number: 16

## 2022-12-18 ENCOUNTER — Other Ambulatory Visit: Payer: Self-pay

## 2022-12-18 ENCOUNTER — Ambulatory Visit
Admission: RE | Admit: 2022-12-18 | Discharge: 2022-12-18 | Disposition: A | Discharge status: Home / Self Care | Payer: Medicare HMO | Source: Ambulatory Visit | Attending: Radiation Oncology | Admitting: Radiation Oncology

## 2022-12-18 DIAGNOSIS — Z510 Encounter for antineoplastic radiation therapy: Secondary | ICD-10-CM | POA: Diagnosis not present

## 2022-12-18 DIAGNOSIS — C50411 Malignant neoplasm of upper-outer quadrant of right female breast: Secondary | ICD-10-CM | POA: Diagnosis not present

## 2022-12-18 DIAGNOSIS — Z170 Estrogen receptor positive status [ER+]: Secondary | ICD-10-CM | POA: Diagnosis not present

## 2022-12-18 LAB — RAD ONC ARIA SESSION SUMMARY
Course Elapsed Days: 23
Plan Fractions Treated to Date: 1
Plan Prescribed Dose Per Fraction: 2 Gy
Plan Total Fractions Prescribed: 4
Plan Total Prescribed Dose: 8 Gy
Reference Point Dosage Given to Date: 2 Gy
Reference Point Session Dosage Given: 2 Gy
Session Number: 17

## 2022-12-19 ENCOUNTER — Other Ambulatory Visit: Payer: Self-pay

## 2022-12-19 ENCOUNTER — Inpatient Hospital Stay: Payer: Medicare HMO | Attending: Hematology and Oncology | Admitting: Hematology and Oncology

## 2022-12-19 ENCOUNTER — Ambulatory Visit
Admission: RE | Admit: 2022-12-19 | Discharge: 2022-12-19 | Disposition: A | Discharge status: Home / Self Care | Payer: Medicare HMO | Source: Ambulatory Visit | Attending: Radiation Oncology | Admitting: Radiation Oncology

## 2022-12-19 VITALS — BP 122/62 | HR 82 | Temp 97.2°F | Resp 18 | Ht 68.0 in | Wt 226.8 lb

## 2022-12-19 DIAGNOSIS — C50411 Malignant neoplasm of upper-outer quadrant of right female breast: Secondary | ICD-10-CM | POA: Insufficient documentation

## 2022-12-19 DIAGNOSIS — Z510 Encounter for antineoplastic radiation therapy: Secondary | ICD-10-CM | POA: Diagnosis not present

## 2022-12-19 DIAGNOSIS — Z170 Estrogen receptor positive status [ER+]: Secondary | ICD-10-CM | POA: Insufficient documentation

## 2022-12-19 DIAGNOSIS — Z923 Personal history of irradiation: Secondary | ICD-10-CM | POA: Diagnosis not present

## 2022-12-19 LAB — RAD ONC ARIA SESSION SUMMARY
Course Elapsed Days: 24
Plan Fractions Treated to Date: 2
Plan Prescribed Dose Per Fraction: 2 Gy
Plan Total Fractions Prescribed: 4
Plan Total Prescribed Dose: 8 Gy
Reference Point Dosage Given to Date: 4 Gy
Reference Point Session Dosage Given: 2 Gy
Session Number: 18

## 2022-12-19 MED ORDER — ANASTROZOLE 1 MG PO TABS: 1.0000 mg | ORAL_TABLET | Freq: Every day | ORAL | 3 refills | Status: AC

## 2022-12-19 NOTE — Assessment & Plan Note (Addendum)
10/07/2022:Right lumpectomy: 1 cm grade 2 IDC with DCIS, margins negative, ER 95%, PR 80%, HER2 positive 3+, Ki-67 10%, 0/3 lymph nodes negative Repeat testing of prognostic panel: ER 100%, PR 100%, Ki67 5%, HER2 equivocal 2+, FISH negative ratio 1.51)   Counseling: I discussed with the patient that the repeat testing on the prognostic panel came back as HER2 negative.     Treatment plan: Oncotype Dx 11/12/2022: Score: 12 (distant recurrence at 9 years: 3%) Adjuvant radiation to be completed 12/23/2022 Adjuvant antiestrogen therapy with anastrozole 1 mg daily x 5 to 7 years  Anastrozole counseling: We discussed the risks and benefits of anti-estrogen therapy with aromatase inhibitors. These include but not limited to insomnia, hot flashes, mood changes, vaginal dryness, bone density loss, and weight gain. We strongly believe that the benefits far outweigh the risks. Patient understands these risks and consented to starting treatment. Planned treatment duration is 5-7 years.    Patient is moving to [place] next week and will find a new oncologist closer to her home.  We are happy to provide her with medical records after she finds an oncologist.

## 2022-12-20 ENCOUNTER — Ambulatory Visit: Payer: Medicare HMO

## 2022-12-20 ENCOUNTER — Encounter: Payer: Self-pay | Admitting: *Deleted

## 2022-12-20 ENCOUNTER — Other Ambulatory Visit: Payer: Self-pay

## 2022-12-20 ENCOUNTER — Ambulatory Visit
Admission: RE | Admit: 2022-12-20 | Discharge: 2022-12-20 | Disposition: A | Discharge status: Home / Self Care | Payer: Medicare HMO | Source: Ambulatory Visit | Attending: Radiation Oncology | Admitting: Radiation Oncology

## 2022-12-20 DIAGNOSIS — C50411 Malignant neoplasm of upper-outer quadrant of right female breast: Secondary | ICD-10-CM | POA: Insufficient documentation

## 2022-12-20 DIAGNOSIS — Z170 Estrogen receptor positive status [ER+]: Secondary | ICD-10-CM | POA: Insufficient documentation

## 2022-12-20 LAB — RAD ONC ARIA SESSION SUMMARY
Course Elapsed Days: 25
Plan Fractions Treated to Date: 3
Plan Prescribed Dose Per Fraction: 2 Gy
Plan Total Fractions Prescribed: 4
Plan Total Prescribed Dose: 8 Gy
Reference Point Dosage Given to Date: 6 Gy
Reference Point Session Dosage Given: 2 Gy
Session Number: 19

## 2022-12-23 ENCOUNTER — Ambulatory Visit
Admission: RE | Admit: 2022-12-23 | Discharge: 2022-12-23 | Disposition: A | Discharge status: Home / Self Care | Payer: Medicare HMO | Source: Ambulatory Visit | Attending: Radiation Oncology | Admitting: Radiation Oncology

## 2022-12-23 ENCOUNTER — Other Ambulatory Visit: Payer: Self-pay

## 2022-12-23 DIAGNOSIS — C50411 Malignant neoplasm of upper-outer quadrant of right female breast: Secondary | ICD-10-CM | POA: Diagnosis not present

## 2022-12-23 DIAGNOSIS — Z170 Estrogen receptor positive status [ER+]: Secondary | ICD-10-CM | POA: Diagnosis not present

## 2022-12-23 LAB — RAD ONC ARIA SESSION SUMMARY
Course Elapsed Days: 28
Plan Fractions Treated to Date: 4
Plan Prescribed Dose Per Fraction: 2 Gy
Plan Total Fractions Prescribed: 4
Plan Total Prescribed Dose: 8 Gy
Reference Point Dosage Given to Date: 8 Gy
Reference Point Session Dosage Given: 2 Gy
Session Number: 20

## 2022-12-24 NOTE — Radiation Completion Notes (Signed)
  Radiation Oncology         ([phone]) [phone] ________________________________  Name: [name] [name] MRN: [id number]  Date of Service: 12/23/2022  DOB: 1956-02-28  End of Treatment Note  Diagnosis:  Stage IA, pT1bN0M0, grade 2 ER/PR positive invasive ductal carcinoma of the right breast.   Intent: Curative     ==========DELIVERED PLANS==========  First Treatment Date: 2022-11-25 - Last Treatment Date: 2022-12-23   Plan Name: Breast_R Site: Breast, Right Technique: 3D Mode: Photon Dose Per Fraction: 2.66 Gy Prescribed Dose (Delivered / Prescribed): 42.56 Gy / 42.56 Gy Prescribed Fxs (Delivered / Prescribed): 16 / 16   Plan Name: Breast_R_Bst Site: Breast, Right Technique: 3D Mode: Photon Dose Per Fraction: 2 Gy Prescribed Dose (Delivered / Prescribed): 8 Gy / 8 Gy Prescribed Fxs (Delivered / Prescribed): 4 / 4     ==========ON TREATMENT VISIT DATES========== 2022-11-29, 2022-12-09, 2022-12-13, 2022-12-20   See weekly On Treatment Notes in Epic for details. The patient tolerated radiation. She developed fatigue and anticipated skin changes in the treatment field.   The patient will receive a call in about one month from the radiation oncology department. She will continue follow up with Dr. Pamelia Hoit as well.      Osker Mason, PAC

## 2022-12-26 NOTE — Therapy (Signed)
OUTPATIENT PHYSICAL THERAPY SOZO SCREENING NOTE   Patient Name: [name] [name] MRN: [id number] DOB:June 25, 1955, 67 y.o., female Today's Date: 12/26/2022  PCP: Agapito Games, MD REFERRING PROVIDER: Emelia Loron, MD    Past Medical History:  Diagnosis Date   Allergy    some statins   Arthritis forever   Cataract both have been removed   Glaucoma    HTN (hypertension) 04/04/2010   Qualifier: Diagnosis of  By: Cathey Endow DO, Karen     Hyperlipidemia    Hypothyroidism    Migraine    hx of   Migraines    Obesity    Obesity    Pre-diabetes    Past Surgical History:  Procedure Laterality Date   ABDOMINAL HYSTERECTOMY  2005   complete   BREAST BIOPSY Right 09/18/2022   MM RT BREAST BX W LOC DEV 1ST LESION IMAGE BX SPEC STEREO GUIDE 09/18/2022 [facility]   BREAST BIOPSY  10/04/2022   MM RT RADIOACTIVE SEED LOC MAMMO GUIDE 10/04/2022 [facility]   BREAST LUMPECTOMY WITH RADIOACTIVE SEED AND SENTINEL LYMPH NODE BIOPSY Right 10/07/2022   Procedure: RIGHT BREAST LUMPECTOMY WITH RADIOACTIVE SEED AND AXILLARY SENTINEL LYMPH NODE BIOPSY;  Surgeon: Emelia Loron, MD;  Location: [facility];  Service: General;  Laterality: Right;   CATARACT EXTRACTION W/ INTRAOCULAR LENS IMPLANT Left 05/10/2014   dexa  08/2005   (+) 1.7   EYE SURGERY  lasik   plus 2 cataract removals   JOINT REPLACEMENT     L hip replacement 2011   migraines, resolved  2003   Right foot surgery  2000   callus removed   TOTAL ABDOMINAL HYSTERECTOMY  2005   Patient Active Problem List   Diagnosis Date Noted   Malignant neoplasm of upper-outer quadrant of right breast in female, estrogen receptor positive (HCC) 09/26/2022   Allergic conjunctivitis of both eyes 10/19/2021   Primary osteoarthritis of right knee 09/25/2021   BMI 33.0-33.9,adult 05/27/2019   Chronic pain of right knee 10/20/2015   Myalgia and myositis 10/11/2015   Status post cataract extraction and insertion of  intraocular lens of left eye 06/24/2014   Primary open angle glaucoma of both eyes, indeterminate stage 05/10/2014   H/O laser assisted in situ keratomileusis 05/10/2014   Glaucoma 10/16/2012   IFG (impaired fasting glucose) 10/16/2012   ADJUSTMENT DISORDER WITH DEPRESSED MOOD 05/23/2010   HTN (hypertension) 04/04/2010   Hypothyroidism 07/02/2007   Hyperlipidemia 07/02/2007   OBESITY 07/02/2007    REFERRING DIAG: right breast cancer at risk for lymphedema  THERAPY DIAG:  No diagnosis found.  PERTINENT HISTORY:  Patient was diagnosed on 09/20/2022 with right grade 2 Invasive Mammary Carcinoma. It measures .7 cm and is located in the lower outer quadrant. It is ER+, PR+, HER 2 + with a Ki67 of 10%. She is s/p Right Lumpectomy on 10/07/2022 with 0/3 LN's. She had a large seroma drained of 180 cc on 10/21/2022.She is planning to move to [place] to care for her aging parents but is not sure when she will be doing that.    PRECAUTIONS: right UE Lymphedema risk,   SUBJECTIVE: movers are coming on Monday to move to [place]  PAIN:  Are you having pain?   SOZO SCREENING: Patient was assessed today using the SOZO machine to determine the lymphedema index score. This was compared to her baseline score. It was determined that she is within the recommended range when compared to her baseline and no further action is needed at this  time. She will continue SOZO screenings. These are done every 3 months for 2 years post operatively followed by every 6 months for 2 years, and then annually.  Patient trying to find a place to be screened in [place] but closest is [facility]oll[facility] Pee, PT 12/26/2022, 2:53 PM

## 2022-12-27 ENCOUNTER — Ambulatory Visit: Payer: Medicare HMO | Attending: General Surgery

## 2022-12-27 DIAGNOSIS — Z170 Estrogen receptor positive status [ER+]: Secondary | ICD-10-CM | POA: Insufficient documentation

## 2022-12-27 DIAGNOSIS — C50511 Malignant neoplasm of lower-outer quadrant of right female breast: Secondary | ICD-10-CM | POA: Insufficient documentation

## 2023-01-19 ENCOUNTER — Encounter: Payer: Self-pay | Admitting: Family Medicine

## 2023-01-19 DIAGNOSIS — E782 Mixed hyperlipidemia: Secondary | ICD-10-CM

## 2023-01-19 DIAGNOSIS — E039 Hypothyroidism, unspecified: Secondary | ICD-10-CM

## 2023-01-19 DIAGNOSIS — I10 Essential (primary) hypertension: Secondary | ICD-10-CM

## 2023-01-19 DIAGNOSIS — R7301 Impaired fasting glucose: Secondary | ICD-10-CM

## 2023-01-23 ENCOUNTER — Encounter: Payer: Self-pay | Admitting: Hematology and Oncology

## 2023-01-27 ENCOUNTER — Ambulatory Visit: Payer: Medicare HMO

## 2023-01-28 ENCOUNTER — Encounter: Payer: Self-pay | Admitting: Family Medicine

## 2023-02-03 LAB — HM DIABETES EYE EXAM

## 2023-02-18 NOTE — Telephone Encounter (Signed)
Orders Placed This Encounter  Procedures   Lipid panel   TSH   Hemoglobin A1c   COMPLETE METABOLIC PANEL WITH GFR

## 2023-02-18 NOTE — Addendum Note (Signed)
Addended by: Nani Gasser D on: 02/18/2023 08:53 AM   Modules accepted: Orders

## 2023-02-18 NOTE — Telephone Encounter (Signed)
Left a message asking for a call back from [facility] in [place]. I need their fax number.

## 2023-02-24 ENCOUNTER — Ambulatory Visit
Admission: RE | Admit: 2023-02-24 | Discharge: 2023-02-24 | Disposition: A | Discharge status: Home / Self Care | Payer: Medicare HMO | Source: Ambulatory Visit | Attending: Hematology and Oncology | Admitting: Hematology and Oncology

## 2023-02-24 NOTE — Progress Notes (Signed)
Radiation Oncology         [phone] ________________________________  Name: [name] [name] MRN: [id number]  Date of Service: 02/24/2023  DOB: 01-Oct-1955  Post Treatment Telephone Note  Diagnosis:  Stage IA, pT1bN0M0, grade 2 ER/PR positive invasive ductal carcinoma of the right breast. (as documented in provider EOT note)  The patient was available for call today.   Symptoms of fatigue have improved since completing therapy.   Symptoms of skin changes have improved since completing therapy.  The patient was encouraged to avoid sun exposure in the area of prior treatment for up to one year following radiation with either sunscreen or by the style of clothing worn in the sun.  The patient has scheduled follow up with her medical oncologist Dr. Pamelia Hoit for ongoing surveillance, and was encouraged to call if she develops concerns or questions regarding radiation.   This concludes the interaction.  Ruel Favors, LPN

## 2023-02-26 ENCOUNTER — Other Ambulatory Visit: Payer: Self-pay

## 2023-02-26 ENCOUNTER — Encounter: Payer: Self-pay | Admitting: Family Medicine

## 2023-02-28 LAB — COMPREHENSIVE METABOLIC PANEL
ALT: 21 [IU]/L (ref 0–32)
AST: 20 [IU]/L (ref 0–40)
Albumin: 4.4 g/dL (ref 3.9–4.9)
Alkaline Phosphatase: 86 [IU]/L (ref 44–121)
BUN/Creatinine Ratio: 25 (ref 12–28)
BUN: 15 mg/dL (ref 8–27)
Bilirubin Total: 0.6 mg/dL (ref 0.0–1.2)
CO2: 24 mmol/L (ref 20–29)
Calcium: 10.5 mg/dL — ABNORMAL HIGH (ref 8.7–10.3)
Chloride: 100 mmol/L (ref 96–106)
Creatinine, Ser: 0.6 mg/dL (ref 0.57–1.00)
Globulin, Total: 2.5 g/dL (ref 1.5–4.5)
Glucose: 112 mg/dL — ABNORMAL HIGH (ref 70–99)
Potassium: 4.2 mmol/L (ref 3.5–5.2)
Sodium: 140 mmol/L (ref 134–144)
Total Protein: 6.9 g/dL (ref 6.0–8.5)
eGFR: 98 mL/min/{1.73_m2} (ref >59)

## 2023-02-28 LAB — TSH: TSH: 0.574 u[IU]/mL (ref 0.450–4.500)

## 2023-02-28 LAB — LIPID PANEL W/O CHOL/HDL RATIO
Cholesterol, Total: 173 mg/dL (ref 100–199)
HDL: 59 mg/dL (ref >39)
LDL Chol Calc (NIH): 85 mg/dL (ref 0–99)
Triglycerides: 172 mg/dL — ABNORMAL HIGH (ref 0–149)
VLDL Cholesterol Cal: 29 mg/dL (ref 5–40)

## 2023-02-28 LAB — HGB A1C W/O EAG: Hgb A1c MFr Bld: 7.2 % — ABNORMAL HIGH (ref 4.8–5.6)

## 2023-02-28 LAB — SPECIMEN STATUS REPORT

## 2023-03-04 ENCOUNTER — Other Ambulatory Visit: Payer: Self-pay | Admitting: Family Medicine

## 2023-03-04 DIAGNOSIS — R7301 Impaired fasting glucose: Secondary | ICD-10-CM

## 2023-03-04 MED ORDER — METFORMIN HCL 1000 MG PO TABS
1000.0000 mg | ORAL_TABLET | Freq: Two times a day (BID) | ORAL | 1 refills | Status: DC
Start: 2023-03-04 — End: 2023-07-24

## 2023-03-04 NOTE — Progress Notes (Signed)
Hi [name] [name], your metabolic panel overall looks good.  Calcium was up just a little bit at 10.5.  If you are taking a calcium supplement then that would be the most likely reason why.  Liver function looks great.  Total cholesterol and LDL look good.  Triglycerides were up just a little bit, similar to last time.  Just continue to work on healthy diet and regular exercise.  Your A1c went up from 6.26 months ago to 7.2 which is technically uncontrolled diabetes.  I will up your metformin to 1000 mg twice a day.  [facility]   I definitely want to see you back in about 3 months.

## 2023-03-06 ENCOUNTER — Encounter: Payer: Self-pay | Admitting: Family Medicine

## 2023-03-06 ENCOUNTER — Telehealth: Payer: Medicare HMO | Admitting: Family Medicine

## 2023-03-06 VITALS — BP 138/86 | HR 78 | Ht 68.0 in | Wt 220.0 lb

## 2023-03-06 DIAGNOSIS — I10 Essential (primary) hypertension: Secondary | ICD-10-CM

## 2023-03-06 DIAGNOSIS — Z6833 Body mass index (BMI) 33.0-33.9, adult: Secondary | ICD-10-CM | POA: Diagnosis not present

## 2023-03-06 DIAGNOSIS — E118 Type 2 diabetes mellitus with unspecified complications: Secondary | ICD-10-CM

## 2023-03-06 DIAGNOSIS — Z7984 Long term (current) use of oral hypoglycemic drugs: Secondary | ICD-10-CM

## 2023-03-06 MED ORDER — TIRZEPATIDE 5 MG/0.5ML ~~LOC~~ SOAJ
5.0000 mg | SUBCUTANEOUS | 0 refills | Status: DC
Start: 2023-03-20 — End: 2023-05-12

## 2023-03-06 MED ORDER — TIRZEPATIDE 2.5 MG/0.5ML ~~LOC~~ SOAJ
2.5000 mg | SUBCUTANEOUS | 0 refills | Status: DC
Start: 2023-03-06 — End: 2023-04-09

## 2023-03-06 NOTE — Assessment & Plan Note (Addendum)
A1C in the uncontrolled range.  New Dx. Already on metformin.  We discussed options including GLP-1's.  Her BMI is 31 and her goal is to get down to about 150 pounds.  She love to be able to also get off some of her blood pressure medication.  She feels like she has gained 70 pounds over the last several years and has had significant difficulty getting it off.  Continue Crestor 5 mg.  Continue with metformin for now.   Discussed how the medication works and potential side effects.  Will likely need prior authorization from her insurance.  Will start with Mounjaro 2.5 mg weekly for a month and then bump up to 5 mg for a month.  Then I had like to follow-up with her to see how she is doing and if she is doing great we will continue to advance the medication.  She may be able to come off of the metformin at that point.

## 2023-03-06 NOTE — Progress Notes (Signed)
Established Patient Office Visit  Subjective   Patient ID: [name] [name], female    DOB: 09/12/1955  Age: 67 y.o. MRN: [id number]  Chief Complaint  Patient presents with   Hypertension    HPI New dx of DM - Has been on metformin and now her A1C is no longer prediabetic. She hasn't started the higher dose metformin yet.  She wanted to talk about options that would also help her with weight loss.   She has been walking more. [place]  Though her alcohol intake has pickedup some.   Hypertension- Pt denies chest pain, SOB, dizziness, or heart palpitations.  Taking meds as directed w/o problems.  Denies medication side effects.       ROS    Objective:     BP 138/86   Pulse 78   Ht 5\' 8"  (1.727 m)   Wt 220 lb (99.8 kg)   BMI 33.45 kg/m    Physical Exam Vitals reviewed.  Constitutional:      Appearance: Normal appearance.  HENT:     Head: Normocephalic.  Pulmonary:     Effort: Pulmonary effort is normal.  Neurological:     Mental Status: She is alert and oriented to person, place, and time.  Psychiatric:        Mood and Affect: Mood normal.        Behavior: Behavior normal.      No results found for any visits on 03/06/23.    The 10-year ASCVD risk score (Arnett DK, et al., 2019) is: 18.1%    Assessment & Plan:   Problem List Items Addressed This Visit       Cardiovascular and Mediastinum   HTN (hypertension)    Is a little elevated today not ideal.  Repeat pressure was better.  Continue to work on healthy diet staying active and weight loss.        Endocrine   Controlled diabetes mellitus type 2 with complications (HCC) - Primary    A1C in the uncontrolled range.  New Dx. Already on metformin.  We discussed options including GLP-1's.  Her BMI is 31 and her goal is to get down to about 150 pounds.  She love to be able to also get off some of her blood pressure medication.  She feels like she has gained 70 pounds  over the last several years and has had significant difficulty getting it off.  Continue Crestor 5 mg.  Continue with metformin for now.   Discussed how the medication works and potential side effects.  Will likely need prior authorization from her insurance.  Will start with Mounjaro 2.5 mg weekly for a month and then bump up to 5 mg for a month.  Then I had like to follow-up with her to see how she is doing and if she is doing great we will continue to advance the medication.  She may be able to come off of the metformin at that point.      Relevant Medications   tirzepatide [facility]) 2.5 MG/0.5ML Pen   tirzepatide [facility]) 5 MG/0.5ML Pen (Start on 03/20/2023)     Other   BMI 33.0-33.9,adult   Other Visit Diagnoses     Serum calcium elevated          Recent calcium level was a little borderline elevated it is not high enough that she needs to stop her supplement that she is taking for her bones but we will keep  an eye on it.  Return in about 2 months (around 05/06/2023) for New start medication.    I spent 25 minutes on the day of the encounter to include pre-visit record review, face-to-face time with the patient and post visit ordering of test.   Nani Gasser, MD

## 2023-03-06 NOTE — Progress Notes (Signed)
Pt would like to discuss A1c results. She said that she feels that maybe she should be treating the problem which is her weight instead of her symptom being her A1c.

## 2023-03-06 NOTE — Assessment & Plan Note (Signed)
Is a little elevated today not ideal.  Repeat pressure was better.  Continue to work on healthy diet staying active and weight loss.

## 2023-03-08 ENCOUNTER — Encounter: Payer: Self-pay | Admitting: Family Medicine

## 2023-03-16 ENCOUNTER — Encounter: Payer: Self-pay | Admitting: Hematology and Oncology

## 2023-03-17 ENCOUNTER — Encounter: Payer: Self-pay | Admitting: *Deleted

## 2023-03-17 NOTE — Progress Notes (Signed)
Received mychart message from pt stating she has moved to [place] and is not happy with her current Oncologist and wishes to continue seeing provider via virtual visits with our office.   Message sent to scheduling team to set up appt in November.

## 2023-03-20 ENCOUNTER — Other Ambulatory Visit: Payer: Self-pay | Admitting: Family Medicine

## 2023-03-20 DIAGNOSIS — E782 Mixed hyperlipidemia: Secondary | ICD-10-CM

## 2023-03-20 DIAGNOSIS — I10 Essential (primary) hypertension: Secondary | ICD-10-CM

## 2023-03-20 DIAGNOSIS — E039 Hypothyroidism, unspecified: Secondary | ICD-10-CM

## 2023-03-29 ENCOUNTER — Encounter: Payer: Self-pay | Admitting: Family Medicine

## 2023-04-09 ENCOUNTER — Encounter: Payer: Self-pay | Admitting: Family Medicine

## 2023-04-09 MED ORDER — TIRZEPATIDE 7.5 MG/0.5ML ~~LOC~~ SOAJ
7.5000 mg | SUBCUTANEOUS | 0 refills | Status: DC
Start: 2023-04-09 — End: 2023-05-12

## 2023-04-16 ENCOUNTER — Inpatient Hospital Stay: Payer: Medicare HMO | Attending: Hematology and Oncology | Admitting: Hematology and Oncology

## 2023-04-16 DIAGNOSIS — Z170 Estrogen receptor positive status [ER+]: Secondary | ICD-10-CM | POA: Diagnosis not present

## 2023-04-16 DIAGNOSIS — C50411 Malignant neoplasm of upper-outer quadrant of right female breast: Secondary | ICD-10-CM

## 2023-04-16 NOTE — Progress Notes (Signed)
HEMATOLOGY-ONCOLOGY TELEPHONE VISIT PROGRESS NOTE  I connected with our patient on 04/16/23 at  2:45 PM EST by telephone and verified that I am speaking with the correct person using two identifiers.  I discussed the limitations, risks, security and privacy concerns of performing an evaluation and management service by telephone and the availability of in person appointments.  I also discussed with the patient that there may be a patient responsible charge related to this service. The patient expressed understanding and agreed to proceed.   History of Present Illness: Follow-up to discuss side effects of anastrozole  [name] [name] is a 67 year old with below mentioned history of right breast cancer who is currently on anastrozole therapy.  She is tolerating it fairly well.  She does have mild hot flashes and night sweats.  She is able to handle the symptoms fairly well.  She moved to [place] but did not like the oncology staff that she encountered and therefore she does not want to follow-up with them and rather wants to connect with [place] once a year for follow-up and antiestrogen therapy.        Oncology History  Malignant neoplasm of upper-outer quadrant of right breast in female, estrogen receptor positive (HCC)  09/18/2022 Initial Diagnosis   Screening mammogram detected right breast asymmetry without any sonographic correlate 7 mm, biopsy: Grade 2 IDC ER 95%, PR 80%, Ki-67 10%, HER2 positive (3+)   10/07/2022 Surgery   Right lumpectomy: 1 cm grade 2 IDC with DCIS, margins negative, ER 95%, PR 80%, HER2 positive 3+, Ki-67 10%, 0/3 lymph nodes negative Repeat testing of prognostic panel: ER 100%, PR 100%, Ki67 5%, HER2 equivocal 2+, FISH negative ratio 1.51)   11/14/2022 Cancer Staging   Staging form: Breast, AJCC 8th Edition - Pathologic: Stage IA (pT1b, pN0(sn), cM0, G2, ER+, PR+, HER2-, Oncotype DX score: 12) - Signed by Ronny Bacon, PA-C on 11/14/2022 Method of lymph node assessment:  Sentinel lymph node biopsy Multigene prognostic tests performed: Oncotype DX Recurrence score range: Greater than or equal to 11 Histologic grading system: 3 grade system     REVIEW OF SYSTEMS:   Constitutional: Denies fevers, chills or abnormal weight loss All other systems were reviewed with the patient and are negative. 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 She did not like the oncology group there. So she wants to stay with [place]. She will keep up with her mammograms with her primary care physician. 1 year follow up Mychart visit      I discussed the assessment and treatment plan with the patient. The patient was provided an opportunity to ask questions and all were answered. The patient agreed with the plan and demonstrated an understanding of the instructions. The patient was advised to call back or seek an in-person evaluation if the symptoms worsen or if the condition fails to improve as anticipated.  I provided 12 minutes of non-face-to-face time during this encounter.  This includes time for charting and coordination of care   Tamsen Meek, MD

## 2023-04-16 NOTE — Assessment & Plan Note (Addendum)
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 She did not like the oncology group there. So she wants to stay with [place].  1 year follow up Mychart visit

## 2023-04-28 ENCOUNTER — Encounter: Payer: Self-pay | Admitting: Family Medicine

## 2023-04-28 ENCOUNTER — Telehealth (INDEPENDENT_AMBULATORY_CARE_PROVIDER_SITE_OTHER): Payer: Medicare HMO | Admitting: Family Medicine

## 2023-04-28 VITALS — BP 126/79 | HR 77 | Ht 68.0 in | Wt 205.0 lb

## 2023-04-28 DIAGNOSIS — E039 Hypothyroidism, unspecified: Secondary | ICD-10-CM

## 2023-04-28 DIAGNOSIS — H401134 Primary open-angle glaucoma, bilateral, indeterminate stage: Secondary | ICD-10-CM | POA: Diagnosis not present

## 2023-04-28 DIAGNOSIS — E118 Type 2 diabetes mellitus with unspecified complications: Secondary | ICD-10-CM | POA: Diagnosis not present

## 2023-04-28 DIAGNOSIS — Z7984 Long term (current) use of oral hypoglycemic drugs: Secondary | ICD-10-CM

## 2023-04-28 DIAGNOSIS — I10 Essential (primary) hypertension: Secondary | ICD-10-CM

## 2023-04-28 NOTE — Progress Notes (Signed)
    Virtual Visit via Video Note  I connected with [name] [name] on 04/28/23 at 11:30 AM EST by a video enabled telemedicine application and verified that I am speaking with the correct person using two identifiers.   I discussed the limitations of evaluation and management by telemedicine and the availability of in person appointments. The patient expressed understanding and agreed to proceed.  Patient location: at home Provider location: in office  Subjective:    CC:   Chief Complaint  Patient presents with   Medical Management of Chronic Issues    HPI: DM - Currently on 5mg  Mounjaro.  Up to the 7.5 starting Saturday.  Some nausea and indigestion.  Taking stool softener every other day.   [facility]. [place], [place].  Sept  Walking regularly.      Past medical history, Surgical history, Family history not pertinant except as noted below, Social history, Allergies, and medications have been entered into the medical record, reviewed, and corrections made.    Objective:    General: Speaking clearly in complete sentences without any shortness of breath.  Alert and oriented x3.  Normal judgment. No apparent acute distress.    Impression and Recommendations:    Problem List Items Addressed This Visit       Cardiovascular and Mediastinum   HTN (hypertension)    Well controlled. Continue current regimen. Follow up in  6 mo         Endocrine   Hypothyroidism    Last TSh below 1. Plan to recheck in Jan.       Relevant Orders   CMP14+EGFR   Hemoglobin A1c   Urine Microalbumin w/creat. ratio   TSH   Controlled diabetes mellitus type 2 with complications (HCC) - Primary    Doing well on the medication. Goal to get off of metformin if A1C is good in January. Continue daily statin. Work on [employer], broadcasting/film/video.   Lab Results  Component Value Date   HGBA1C 7.2 (H) 02/27/2023         Relevant Orders   CMP14+EGFR   Hemoglobin A1c   Urine  Microalbumin w/creat. ratio   TSH     Other   Primary open angle glaucoma of both eyes, indeterminate stage    Recent eye exam is uptodate.        Orders Placed This Encounter  Procedures   CMP14+EGFR    Pt has appt in 06/03/23   Hemoglobin A1c    Pt has appt in 06/03/23   Urine Microalbumin w/creat. ratio    Pt has appt in 06/03/23   TSH    No orders of the defined types were placed in this encounter.  \will fax lab orders to [facility].    Will be due for colon cancer screening in fEb.   Eye exam done in Sept. Will abstract.    I discussed the assessment and treatment plan with the patient. The patient was provided an opportunity to ask questions and all were answered. The patient agreed with the plan and demonstrated an understanding of the instructions.   The patient was advised to call back or seek an in-person evaluation if the symptoms worsen or if the condition fails to improve as anticipated.   Nani Gasser, MD

## 2023-04-28 NOTE — Assessment & Plan Note (Signed)
Last TSh below 1. Plan to recheck in Jan.

## 2023-04-28 NOTE — Assessment & Plan Note (Addendum)
Doing well on the medication. Goal to get off of metformin if A1C is good in January. Continue daily statin. Work on [employer], broadcasting/film/video.   Lab Results  Component Value Date   HGBA1C 7.2 (H) 02/27/2023

## 2023-04-28 NOTE — Progress Notes (Signed)
Will start 7.5 on Saturday. When she first takes the medication she does feel a little nauseated for a day but that feeling does subside.

## 2023-04-28 NOTE — Assessment & Plan Note (Signed)
Well controlled. Continue current regimen. Follow up in  6 mo  

## 2023-04-28 NOTE — Assessment & Plan Note (Signed)
Recent eye exam is uptodate.

## 2023-05-07 ENCOUNTER — Telehealth: Payer: Self-pay | Admitting: Family Medicine

## 2023-05-07 DIAGNOSIS — Z1211 Encounter for screening for malignant neoplasm of colon: Secondary | ICD-10-CM

## 2023-05-07 NOTE — Telephone Encounter (Signed)
 Care team updated and letter sent for eye exam notes.

## 2023-05-07 NOTE — Telephone Encounter (Signed)
Call pt: don't think FIT stool test available through [facility]. I would recommend we order Cologuard test.  See if she is ok with that

## 2023-05-08 NOTE — Telephone Encounter (Signed)
Spoke w/pt Cologuard sent.

## 2023-05-10 ENCOUNTER — Other Ambulatory Visit: Payer: Self-pay | Admitting: Family Medicine

## 2023-05-12 ENCOUNTER — Encounter: Payer: Self-pay | Admitting: Family Medicine

## 2023-05-12 DIAGNOSIS — E118 Type 2 diabetes mellitus with unspecified complications: Secondary | ICD-10-CM

## 2023-05-12 MED ORDER — TIRZEPATIDE 10 MG/0.5ML ~~LOC~~ SOAJ: 10.0000 mg | SUBCUTANEOUS | 1 refills | Status: DC

## 2023-05-12 NOTE — Telephone Encounter (Signed)
Rx sent for next dose  / Meds ordered this encounter  Medications   tirzepatide (MOUNJARO) 10 MG/0.5ML Pen    Sig: Inject 10 mg into the skin once a week.    Dispense:  2 mL    Refill:  1

## 2023-05-15 MED ORDER — TIRZEPATIDE 7.5 MG/0.5ML ~~LOC~~ SOAJ: 7.5000 mg | SUBCUTANEOUS | 1 refills | Status: DC

## 2023-05-25 LAB — COLOGUARD: COLOGUARD: NEGATIVE

## 2023-05-26 NOTE — Progress Notes (Signed)
 Great news! Your Cologuard test is negative.  Recommend repeat colon cancer screening in 3 years.

## 2023-05-28 ENCOUNTER — Encounter: Payer: Self-pay | Admitting: Family Medicine

## 2023-06-05 ENCOUNTER — Encounter: Payer: Self-pay | Admitting: Family Medicine

## 2023-06-05 LAB — CMP14+EGFR
ALT: 21 [IU]/L (ref 0–32)
AST: 21 [IU]/L (ref 0–40)
Albumin: 4.5 g/dL (ref 3.9–4.9)
Alkaline Phosphatase: 82 [IU]/L (ref 44–121)
BUN/Creatinine Ratio: 16 (ref 12–28)
BUN: 11 mg/dL (ref 8–27)
Bilirubin Total: 0.5 mg/dL (ref 0.0–1.2)
CO2: 24 mmol/L (ref 20–29)
Calcium: 10.1 mg/dL (ref 8.7–10.3)
Chloride: 98 mmol/L (ref 96–106)
Creatinine, Ser: 0.69 mg/dL (ref 0.57–1.00)
Globulin, Total: 2.5 g/dL (ref 1.5–4.5)
Glucose: 101 mg/dL — ABNORMAL HIGH (ref 70–99)
Potassium: 4.2 mmol/L (ref 3.5–5.2)
Sodium: 138 mmol/L (ref 134–144)
Total Protein: 7 g/dL (ref 6.0–8.5)
eGFR: 95 mL/min/{1.73_m2} (ref >59)

## 2023-06-05 LAB — MICROALBUMIN / CREATININE URINE RATIO
Creatinine, Urine: 95.1 mg/dL
Microalb/Creat Ratio: 11 mg/g{creat} (ref 0–29)
Microalbumin, Urine: 10.7 ug/mL

## 2023-06-05 LAB — HEMOGLOBIN A1C
Est. average glucose Bld gHb Est-mCnc: 123 mg/dL
Hgb A1c MFr Bld: 5.9 % — ABNORMAL HIGH (ref 4.8–5.6)

## 2023-06-05 LAB — TSH: TSH: 0.145 u[IU]/mL — ABNORMAL LOW (ref 0.450–4.500)

## 2023-06-05 NOTE — Progress Notes (Signed)
Hi [name] [name], metabolic panel overall looks good A1c looks much better at 5.9 down from 7.2, great work!  Thyroid level is a little overly suppressed.  We will need to make a slight adjustment to your regimen.  Please verify with [place] how exactly you are taking the medication so I can make a small change.  No excess protein in the urine.

## 2023-06-07 ENCOUNTER — Encounter: Payer: Self-pay | Admitting: Family Medicine

## 2023-06-09 MED ORDER — TIRZEPATIDE 7.5 MG/0.5ML ~~LOC~~ SOAJ: 7.5000 mg | SUBCUTANEOUS | 1 refills | Status: DC

## 2023-06-09 MED ORDER — TIRZEPATIDE 7.5 MG/0.5ML ~~LOC~~ SOAJ: 7.5000 mg | SUBCUTANEOUS | 0 refills | Status: AC

## 2023-06-15 ENCOUNTER — Encounter: Payer: Self-pay | Admitting: Family Medicine

## 2023-06-15 DIAGNOSIS — Z1231 Encounter for screening mammogram for malignant neoplasm of breast: Secondary | ICD-10-CM

## 2023-07-01 ENCOUNTER — Encounter: Payer: Self-pay | Admitting: Family Medicine

## 2023-07-01 ENCOUNTER — Ambulatory Visit (INDEPENDENT_AMBULATORY_CARE_PROVIDER_SITE_OTHER): Payer: Medicare HMO

## 2023-07-01 VITALS — Ht 68.0 in | Wt 193.0 lb

## 2023-07-01 DIAGNOSIS — Z0000 Encounter for general adult medical examination without abnormal findings: Secondary | ICD-10-CM | POA: Diagnosis not present

## 2023-07-01 NOTE — Patient Instructions (Signed)
  [name] [name] , Thank you for taking time to come for your Medicare Wellness Visit. I appreciate your ongoing commitment to your health goals. Please review the following plan we discussed and let me know if I can assist you in the future.   These are the goals we discussed:  Goals       Patient Stated (pt-stated)      Patient stated that she would like to loose 50 lbs      Weight (lb) < 200 lb (90.7 kg)      She would like to lose weight to 150 lbs.         This is a list of the screening recommended for you and due dates:  Health Maintenance  Topic Date Due   Complete foot exam   Never done   COVID-19 Vaccine (6 - 2024-25 season) 03/25/2023   Stool Blood Test  07/12/2023   Hemoglobin A1C  12/01/2023   Eye exam for diabetics  02/03/2024   Yearly kidney function blood test for diabetes  06/02/2024   Yearly kidney health urinalysis for diabetes  06/02/2024   Medicare Annual Wellness Visit  06/30/2024   Mammogram  09/03/2024   Cologuard (Stool DNA test)  05/18/2026   DTaP/Tdap/Td vaccine (4 - Td or Tdap) 09/12/2027   Pneumonia Vaccine  Completed   Flu Shot  Completed   DEXA scan (bone density measurement)  Completed   Hepatitis C Screening  Completed   Zoster (Shingles) Vaccine  Completed   HPV Vaccine  Aged Out   Colon Cancer Screening  Discontinued

## 2023-07-01 NOTE — Progress Notes (Signed)
Subjective:   [name] [name] is a 68 y.o. female who presents for Medicare Annual (Subsequent) preventive examination.  Visit Complete: Virtual I connected with  [name] [name] on 07/01/23 by a audio enabled telemedicine application and verified that I am speaking with the correct person using two identifiers.  Patient Location: Home  Provider Location: Office/Clinic  I discussed the limitations of evaluation and management by telemedicine. The patient expressed understanding and agreed to proceed.  Vital Signs: Because this visit was a virtual/telehealth visit, some criteria may be missing or patient reported. Any vitals not documented were not able to be obtained and vitals that have been documented are patient reported.  Patient Medicare AWV questionnaire was completed by the patient on n/a; I have confirmed that all information answered by patient is correct and no changes since this date.  Cardiac Risk Factors include: advanced age (>30men, >93 women);diabetes mellitus;hypertension;dyslipidemia     Objective:    Today's Vitals   07/01/23 1044  Weight: 193 lb (87.5 kg)  Height: 5\' 8"  (1.727 m)   Body mass index is 29.35 kg/m.     07/01/2023   10:54 AM 12/19/2022   11:19 AM 11/14/2022    9:47 AM 10/15/2022   10:40 AM 10/07/2022    9:51 AM 09/30/2022   10:25 AM 09/25/2022    4:02 PM  Advanced Directives  Does Patient Have a Medical Advance Directive? Yes Yes Yes Yes Yes Yes Yes  Type of Estate agent of [place];Living will Healthcare Power of [employer] of [place];Living will Healthcare Power of [place];Living will Healthcare Power of [place];Living will Healthcare Power of [place];Living will Healthcare Power of [place];Living will;Out of facility DNR (pink MOST or yellow form)  Does patient want to make changes to medical advance directive? No - Patient declined  No - Patient declined No - Patient declined No - Patient declined  No  - Patient declined  Copy of Healthcare Power of Attorney in Chart? Yes - validated most recent copy scanned in chart (See row information) No - copy requested No - copy requested No - copy requested No - copy requested  No - copy requested    Current Medications (verified) Outpatient Encounter Medications as of 07/01/2023  Medication Sig   amLODipine (NORVASC) 5 MG tablet TAKE 1 TABLET EVERY DAY   anastrozole (ARIMIDEX) 1 MG tablet Take 1 tablet (1 mg total) by mouth daily.   Calcium Carbonate (CALCIUM 600 PO) Take by mouth.   Ibuprofen 40 MG/ML SUSP    latanoprost (XALATAN) 0.005 % ophthalmic solution Place 1 drop into both eyes at bedtime.   levothyroxine (SYNTHROID) 125 MCG tablet TAKE 1 TABLET EVERY DAY   losartan-hydrochlorothiazide (HYZAAR) 100-25 MG tablet TAKE 1 TABLET EVERY DAY   metFORMIN (GLUCOPHAGE) 1000 MG tablet Take 1 tablet (1,000 mg total) by mouth 2 (two) times daily with a meal.   Multiple Vitamin (MULTIVITAMIN) capsule Take 1 capsule by mouth daily.   rosuvastatin (CRESTOR) 5 MG tablet TAKE 1 TABLET AT BEDTIME   timolol (BETIMOL) 0.5 % ophthalmic solution Place 1 drop into both eyes daily. AM   timolol (TIMOPTIC) 0.5 % ophthalmic solution SMARTSIG:In Eye(s)   tirzepatide (MOUNJARO) 7.5 MG/0.5ML Pen Inject 7.5 mg into the skin once a week.   No facility-administered encounter medications on file as of 07/01/2023.    Allergies (verified) Lipitor [atorvastatin], Statins, Dorzolamide hydrochloride [dorzolamide], Lisinopril, Simvastatin, and Livalo [pitavastatin]   History: Past Medical History:  Diagnosis Date   Allergy  some statins   Arthritis forever   Cataract both have been removed   Glaucoma    HTN (hypertension) 04/04/2010   Qualifier: Diagnosis of  By: Cathey Endow DO, Karen     Hyperlipidemia    Hypothyroidism    Migraine    hx of   Migraines    Obesity    Obesity    Pre-diabetes    Past Surgical History:  Procedure Laterality Date   ABDOMINAL  HYSTERECTOMY  2005   complete   BREAST BIOPSY Right 09/18/2022   MM RT BREAST BX W LOC DEV 1ST LESION IMAGE BX SPEC STEREO GUIDE 09/18/2022 [facility]   BREAST BIOPSY  10/04/2022   MM RT RADIOACTIVE SEED LOC MAMMO GUIDE 10/04/2022 [facility]   BREAST LUMPECTOMY WITH RADIOACTIVE SEED AND SENTINEL LYMPH NODE BIOPSY Right 10/07/2022   Procedure: RIGHT BREAST LUMPECTOMY WITH RADIOACTIVE SEED AND AXILLARY SENTINEL LYMPH NODE BIOPSY;  Surgeon: Emelia Loron, MD;  Location: [facility];  Service: General;  Laterality: Right;   CATARACT EXTRACTION W/ INTRAOCULAR LENS IMPLANT Left 05/10/2014   dexa  08/2005   (+) 1.7   EYE SURGERY  lasik   plus 2 cataract removals   JOINT REPLACEMENT     L hip replacement 2011   migraines, resolved  2003   Right foot surgery  2000   callus removed   TOTAL ABDOMINAL HYSTERECTOMY  2005   Family History  Problem Relation Age of Onset   Heart attack Mother    CAD Mother        stents   Hyperlipidemia Mother    Arthritis Mother    Heart disease Mother    Other Father 11       stents   Hypertension Father    Arthritis Father    Diabetes Father    Heart disease Father    Arthritis Sister    Obesity Sister    Hyperlipidemia Brother    Vision loss Maternal Aunt    Social History   Socioeconomic History   Marital status: Divorced    Spouse name: Not on file   Number of children: 0   Years of education: 14   Highest education level: Associate degree: academic program  Occupational History    Employer: [employer]   Occupation: Retired  Tobacco Use   Smoking status: Former    Current packs/day: 0.00    Average packs/day: 1 pack/day for 15.0 years (15.0 ttl pk-yrs)    Types: Cigarettes    Start date: 05/21/1979    Quit date: 05/20/1994    Years since quitting: 29.1   Smokeless tobacco: Never   Tobacco comments:    quit smoking long time ago  Vaping Use   Vaping status: Never Used  Substance and Sexual Activity    Alcohol use: Yes    Alcohol/week: 6.0 standard drinks of alcohol    Types: 3 Glasses of wine, 3 Cans of beer per week    Comment: 2-3 glasses of wine on weekends   Drug use: No   Sexual activity: Not Currently    Birth control/protection: None  Other Topics Concern   Not on file  Social History Narrative   Lives alone. She enjoys playing pool, going out with friends and walking around malls and park.   Social Drivers of Corporate investment banker Strain: Low Risk  (07/01/2023)   Overall Financial Resource Strain (CARDIA)    Difficulty of Paying Living Expenses: Not hard at all  Food  Insecurity: No Food Insecurity (07/01/2023)   Hunger Vital Sign    Worried About Running Out of Food in the Last Year: Never true    Ran Out of Food in the Last Year: Never true  Transportation Needs: No Transportation Needs (07/01/2023)   PRAPARE - Administrator, Civil Service (Medical): No    Lack of Transportation (Non-Medical): No  Physical Activity: Insufficiently Active (07/01/2023)   Exercise Vital Sign    Days of Exercise per Week: 2 days    Minutes of Exercise per Session: 30 min  Stress: No Stress Concern Present (07/01/2023)   [employer] of Occupational Health - Occupational Stress Questionnaire    Feeling of Stress : Not at all  Social Connections: Moderately Isolated (07/01/2023)   Social Connection and Isolation Panel [NHANES]    Frequency of Communication with Friends and Family: More than three times a week    Frequency of Social Gatherings with Friends and Family: More than three times a week    Attends Religious Services: Never    Database administrator or Organizations: Yes    Attends Engineer, structural: More than 4 times per year    Marital Status: Divorced    Tobacco Counseling Counseling given: Not Answered Tobacco comments: quit smoking long time ago   Clinical Intake:  Pre-visit preparation completed: Yes  Pain : No/denies pain     BMI  - recorded: 29.35 Nutritional Status: BMI 25 -29 Overweight Nutritional Risks: None Diabetes: Yes CBG done?: No Did pt. bring in CBG monitor from home?: No  What is the last grade level you completed in school?: 14  Interpreter Needed?: No      Activities of Daily Living    07/01/2023   10:46 AM 06/27/2023    9:17 AM  In your present state of health, do you have any difficulty performing the following activities:  Hearing? 0 0  Vision? 0 0  Difficulty concentrating or making decisions? 0 0  Walking or climbing stairs? 0 1  Dressing or bathing? 0 0  Doing errands, shopping? 0 0  Preparing Food and eating ? N N  Using the Toilet? N N  In the past six months, have you accidently leaked urine? N N  Do you have problems with loss of bowel control? N N  Managing your Medications? N N  Managing your Finances? N N  Housekeeping or managing your Housekeeping? N N    Patient Care Team: Agapito Games, MD as PCP - General (Family Medicine) Pershing Proud, RN as Oncology Nurse Navigator Donnelly Angelica, RN as Oncology Nurse Navigator Serena Croissant, MD as Consulting Physician (Hematology and Oncology)  Indicate any recent Medical Services you may have received from other than [facility] providers in the past year (date may be approximate).     Assessment:   This is a routine wellness examination for [name] [name].  Hearing/Vision screen Hearing Screening - Comments:: Unable to test  Vision Screening - Comments:: Unable to test   Goals Addressed             This Visit's Progress    Weight (lb) < 200 lb (90.7 kg)   193 lb (87.5 kg)    She would like to lose weight to 150 lbs.       Depression Screen    07/01/2023   10:53 AM 11/14/2022   10:05 AM 06/28/2022   10:06 AM 12/22/2020    2:49 PM 05/26/2018  8:10 AM 11/17/2017    7:14 AM 02/24/2017    7:10 AM  PHQ 2/9 Scores  PHQ - 2 Score 0 2 0 1 2 0 1  PHQ- 9 Score    4 6      Fall Risk    07/01/2023   10:55 AM 06/27/2023     9:17 AM 06/28/2022   10:05 AM 06/24/2022    9:35 AM 12/22/2020    2:56 PM  Fall Risk   Falls in the past year? 1 1 0 0 0  Number falls in past yr: 0 0 0  0  Injury with Fall? 0 0 0  0  Risk for fall due to : No Fall Risks  No Fall Risks    Follow up Falls evaluation completed  Falls evaluation completed;Education provided  Falls evaluation completed    MEDICARE RISK AT HOME: Medicare Risk at Home Any stairs in or around the home?: Yes If so, are there any without handrails?: Yes Home free of loose throw rugs in walkways, pet beds, electrical cords, etc?: Yes Adequate lighting in your home to reduce risk of falls?: Yes Life alert?: No Use of a cane, walker or w/c?: No Grab bars in the bathroom?: No Shower chair or bench in shower?: No Elevated toilet seat or a handicapped toilet?: No  TIMED UP AND GO:  Was the test performed?  No    Cognitive Function:        07/01/2023   10:56 AM 06/28/2022   10:08 AM  6CIT Screen  What Year? 0 points 0 points  What month? 0 points 0 points  What time? 0 points 0 points  Count back from 20 0 points 0 points  Months in reverse 0 points 0 points  Repeat phrase 0 points 0 points  Total Score 0 points 0 points    Immunizations Immunization History  Administered Date(s) Administered   Fluad Quad(high Dose 65+) 02/26/2022   Influenza Split 02/15/2012   Influenza Whole 03/20/2006, 04/26/2008, 02/17/2009   Influenza,inj,Quad PF,6+ Mos 02/08/2013, 03/11/2013, 01/27/2018, 02/23/2021   Influenza-Unspecified 02/14/2014, 03/06/2015, 02/13/2016, 02/11/2017, 03/04/2019, 03/10/2020, 01/28/2023   Moderna Covid-19 Vaccine Bivalent Booster 60yrs & up 02/28/2022   Moderna Sars-Covid-2 Vaccination 08/01/2019, 09/03/2019, 05/08/2020, 02/28/2022   PNEUMOCOCCAL CONJUGATE-20 08/27/2021   Td 07/03/2007   Td (Adult), 2 Lf Tetanus Toxid, Preservative Free 07/03/2007   Tdap 09/11/2017   Unspecified SARS-COV-2 Vaccination 01/28/2023   Zoster  Recombinant(Shingrix) 06/16/2017, 09/02/2017    TDAP status: Up to date  Flu Vaccine status: Up to date  Pneumococcal vaccine status: Up to date  Covid-19 vaccine status: Completed vaccines  Qualifies for Shingles Vaccine? Yes   Zostavax completed No   Shingrix Completed?: Yes  Screening Tests Health Maintenance  Topic Date Due   FOOT EXAM  Never done   COVID-19 Vaccine (6 - 2024-25 season) 03/25/2023   Colonoscopy  04/27/2024 (Originally 08/16/2019)   COLON CANCER SCREENING ANNUAL FOBT  07/12/2023   HEMOGLOBIN A1C  12/01/2023   OPHTHALMOLOGY EXAM  02/03/2024   Diabetic kidney evaluation - eGFR measurement  06/02/2024   Diabetic kidney evaluation - Urine ACR  06/02/2024   Medicare Annual Wellness (AWV)  06/30/2024   MAMMOGRAM  09/03/2024   DTaP/Tdap/Td (4 - Td or Tdap) 09/12/2027   Pneumonia Vaccine 63+ Years old  Completed   INFLUENZA VACCINE  Completed   DEXA SCAN  Completed   Hepatitis C Screening  Completed   Zoster Vaccines- Shingrix  Completed   HPV VACCINES  Aged Out    Health Maintenance  Health Maintenance Due  Topic Date Due   FOOT EXAM  Never done   COVID-19 Vaccine (6 - 2024-25 season) 03/25/2023    Colorectal cancer screening: Type of screening: Cologuard. Completed 05/19/2023. Repeat every 3 years  Mammogram status: Completed 09/04/2022. Repeat every year  Bone Density status: Completed 09/26/2021. Results reflect: Bone density results: NORMAL. Repeat every 2 years.  Lung Cancer Screening: (Low Dose CT Chest recommended if Age 51-80 years, 20 pack-year currently smoking OR have quit w/in 15years.) does not qualify.   Lung Cancer Screening Referral: n/a  Additional Screening:  Hepatitis C Screening: does qualify; Completed 05/26/2018  Vision Screening: Recommended annual ophthalmology exams for early detection of glaucoma and other disorders of the eye. Is the patient up to date with their annual eye exam?  Yes  Who is the provider or what is the  name of the office in which the patient attends annual eye exams? Dr Jackquline Bosch If pt is not established with a provider, would they like to be referred to a provider to establish care? No .   Dental Screening: Recommended annual dental exams for proper oral hygiene  Diabetic Foot Exam: Diabetic Foot Exam: Overdue, Pt has been advised about the importance in completing this exam. Pt is scheduled for diabetic foot exam on n/a.Patient reports [employer]/Medicare had a nurse come by her house. She did a foot exam and it was normal. Patient will send the notes through MyChart.   Community Resource Referral / Chronic Care Management: CRR required this visit?  No   CCM required this visit?  No     Plan:     I have personally reviewed and noted the following in the patient's chart:   Medical and social history Use of alcohol, tobacco or illicit drugs  Current medications and supplements including opioid prescriptions. Patient is not currently taking opioid prescriptions. Functional ability and status Nutritional status Physical activity Advanced directives List of other physicians Hospitalizations, surgeries, and ER visits in previous 12 months Vitals Screenings to include cognitive, depression, and falls Referrals and appointments  In addition, I have reviewed and discussed with patient certain preventive protocols, quality metrics, and best practice recommendations. A written personalized care plan for preventive services as well as general preventive health recommendations were provided to patient.     Esmond Harps, CMA   07/01/2023   After Visit Summary: (MyChart) Due to this being a telephonic visit, the after visit summary with patients personalized plan was offered to patient via MyChart   Nurse Notes:   [name] [name] is a 68 y.o. female patient of Nani Gasser, MD who had a Medicare Annual Wellness Visit today via telephone. She currently lives in community with  lots of different activities. She is enjoying playing pool with her friends. She does walk around the community.

## 2023-07-07 NOTE — Telephone Encounter (Signed)
 Referral pended for sign-off.

## 2023-07-18 ENCOUNTER — Encounter: Payer: Self-pay | Admitting: Hematology and Oncology

## 2023-07-24 ENCOUNTER — Other Ambulatory Visit: Payer: Self-pay | Admitting: Family Medicine

## 2023-07-24 DIAGNOSIS — R7301 Impaired fasting glucose: Secondary | ICD-10-CM

## 2023-07-28 ENCOUNTER — Encounter: Payer: Self-pay | Admitting: Family Medicine

## 2023-09-08 LAB — HM MAMMOGRAPHY

## 2023-10-05 ENCOUNTER — Other Ambulatory Visit: Payer: Self-pay | Admitting: Family Medicine
